# Patient Record
Sex: Male | Born: 1940 | Race: White | Hispanic: No | Smoking: Current every day smoker
Health system: Southern US, Community
[De-identification: ages and names within clinical notes are randomized; demographics above are authoritative.]

## PROBLEM LIST (undated history)

## (undated) DIAGNOSIS — F419 Anxiety disorder, unspecified: Secondary | ICD-10-CM

## (undated) DIAGNOSIS — E785 Hyperlipidemia, unspecified: Secondary | ICD-10-CM

## (undated) DIAGNOSIS — G43909 Migraine, unspecified, not intractable, without status migrainosus: Secondary | ICD-10-CM

## (undated) DIAGNOSIS — J9690 Respiratory failure, unspecified, unspecified whether with hypoxia or hypercapnia: Secondary | ICD-10-CM

## (undated) DIAGNOSIS — I219 Acute myocardial infarction, unspecified: Secondary | ICD-10-CM

## (undated) DIAGNOSIS — M5090 Cervical disc disorder, unspecified, unspecified cervical region: Secondary | ICD-10-CM

## (undated) DIAGNOSIS — K219 Gastro-esophageal reflux disease without esophagitis: Secondary | ICD-10-CM

## (undated) DIAGNOSIS — E119 Type 2 diabetes mellitus without complications: Secondary | ICD-10-CM

## (undated) DIAGNOSIS — Z8673 Personal history of transient ischemic attack (TIA), and cerebral infarction without residual deficits: Secondary | ICD-10-CM

## (undated) DIAGNOSIS — I2510 Atherosclerotic heart disease of native coronary artery without angina pectoris: Secondary | ICD-10-CM

## (undated) DIAGNOSIS — I10 Essential (primary) hypertension: Secondary | ICD-10-CM

## (undated) HISTORY — DX: Essential (primary) hypertension: I10

## (undated) HISTORY — DX: Acute myocardial infarction, unspecified: I21.9

## (undated) HISTORY — DX: Atherosclerotic heart disease of native coronary artery without angina pectoris: I25.10

## (undated) HISTORY — DX: Gastro-esophageal reflux disease without esophagitis: K21.9

## (undated) HISTORY — PX: BACK SURGERY: SHX140

## (undated) HISTORY — DX: Anxiety disorder, unspecified: F41.9

## (undated) HISTORY — DX: Hyperlipidemia, unspecified: E78.5

## (undated) HISTORY — PX: CAROTID ENDARTERECTOMY: SUR193

## (undated) HISTORY — DX: Personal history of transient ischemic attack (TIA), and cerebral infarction without residual deficits: Z86.73

## (undated) HISTORY — PX: ANTERIOR CERVICAL DECOMP/DISCECTOMY FUSION: SHX1161

## (undated) HISTORY — DX: Cervical disc disorder, unspecified, unspecified cervical region: M50.90

## (undated) HISTORY — DX: Migraine, unspecified, not intractable, without status migrainosus: G43.909

## (undated) HISTORY — DX: Type 2 diabetes mellitus without complications: E11.9

---

## 1995-04-28 DIAGNOSIS — Z8673 Personal history of transient ischemic attack (TIA), and cerebral infarction without residual deficits: Secondary | ICD-10-CM

## 1995-04-28 HISTORY — DX: Personal history of transient ischemic attack (TIA), and cerebral infarction without residual deficits: Z86.73

## 1998-09-09 ENCOUNTER — Inpatient Hospital Stay (HOSPITAL_COMMUNITY): Admission: RE | Admit: 1998-09-09 | Discharge: 1998-09-11 | Payer: Self-pay | Admitting: Neurosurgery

## 2001-04-27 HISTORY — PX: CORONARY ARTERY BYPASS GRAFT: SHX141

## 2001-11-30 ENCOUNTER — Encounter: Payer: Self-pay | Admitting: Thoracic Surgery (Cardiothoracic Vascular Surgery)

## 2001-11-30 ENCOUNTER — Inpatient Hospital Stay (HOSPITAL_COMMUNITY): Admission: EM | Admit: 2001-11-30 | Discharge: 2001-12-05 | Payer: Self-pay | Admitting: Cardiovascular Disease

## 2001-12-01 ENCOUNTER — Encounter: Payer: Self-pay | Admitting: Thoracic Surgery (Cardiothoracic Vascular Surgery)

## 2001-12-01 ENCOUNTER — Encounter: Payer: Self-pay | Admitting: Internal Medicine

## 2001-12-02 ENCOUNTER — Encounter: Payer: Self-pay | Admitting: Thoracic Surgery (Cardiothoracic Vascular Surgery)

## 2001-12-03 ENCOUNTER — Encounter: Payer: Self-pay | Admitting: Thoracic Surgery (Cardiothoracic Vascular Surgery)

## 2001-12-04 ENCOUNTER — Encounter: Payer: Self-pay | Admitting: Thoracic Surgery (Cardiothoracic Vascular Surgery)

## 2001-12-05 ENCOUNTER — Encounter: Payer: Self-pay | Admitting: Thoracic Surgery (Cardiothoracic Vascular Surgery)

## 2002-01-24 ENCOUNTER — Inpatient Hospital Stay (HOSPITAL_COMMUNITY)
Admission: EM | Admit: 2002-01-24 | Discharge: 2002-01-29 | Payer: Self-pay | Admitting: Thoracic Surgery (Cardiothoracic Vascular Surgery)

## 2002-01-25 ENCOUNTER — Encounter: Payer: Self-pay | Admitting: Thoracic Surgery (Cardiothoracic Vascular Surgery)

## 2004-06-09 ENCOUNTER — Ambulatory Visit: Payer: Self-pay | Admitting: Cardiology

## 2005-08-06 ENCOUNTER — Ambulatory Visit: Payer: Self-pay | Admitting: Cardiology

## 2005-08-06 ENCOUNTER — Inpatient Hospital Stay (HOSPITAL_COMMUNITY): Admission: AD | Admit: 2005-08-06 | Discharge: 2005-08-08 | Payer: Self-pay | Admitting: Internal Medicine

## 2005-08-26 ENCOUNTER — Ambulatory Visit: Payer: Self-pay | Admitting: Internal Medicine

## 2005-09-02 ENCOUNTER — Encounter (INDEPENDENT_AMBULATORY_CARE_PROVIDER_SITE_OTHER): Payer: Self-pay | Admitting: *Deleted

## 2005-09-02 ENCOUNTER — Ambulatory Visit (HOSPITAL_COMMUNITY): Admission: RE | Admit: 2005-09-02 | Discharge: 2005-09-02 | Payer: Self-pay | Admitting: Internal Medicine

## 2005-09-02 ENCOUNTER — Ambulatory Visit: Payer: Self-pay | Admitting: Internal Medicine

## 2005-09-14 ENCOUNTER — Ambulatory Visit (HOSPITAL_COMMUNITY): Admission: RE | Admit: 2005-09-14 | Discharge: 2005-09-14 | Payer: Self-pay | Admitting: Internal Medicine

## 2006-04-07 ENCOUNTER — Ambulatory Visit: Payer: Self-pay | Admitting: Cardiology

## 2006-04-07 ENCOUNTER — Inpatient Hospital Stay (HOSPITAL_COMMUNITY): Admission: AD | Admit: 2006-04-07 | Discharge: 2006-04-08 | Payer: Self-pay | Admitting: Cardiology

## 2006-04-08 ENCOUNTER — Encounter: Payer: Self-pay | Admitting: Cardiology

## 2006-04-29 ENCOUNTER — Ambulatory Visit: Payer: Self-pay | Admitting: Cardiology

## 2008-08-31 ENCOUNTER — Encounter (INDEPENDENT_AMBULATORY_CARE_PROVIDER_SITE_OTHER): Payer: Self-pay

## 2009-02-06 ENCOUNTER — Ambulatory Visit: Payer: Self-pay | Admitting: Cardiology

## 2009-02-06 ENCOUNTER — Encounter: Payer: Self-pay | Admitting: Physician Assistant

## 2009-02-06 ENCOUNTER — Encounter: Payer: Self-pay | Admitting: Cardiology

## 2009-02-07 ENCOUNTER — Encounter: Payer: Self-pay | Admitting: Cardiology

## 2009-02-08 ENCOUNTER — Encounter: Payer: Self-pay | Admitting: Cardiology

## 2009-02-27 ENCOUNTER — Encounter: Payer: Self-pay | Admitting: Cardiology

## 2009-02-27 DIAGNOSIS — I2510 Atherosclerotic heart disease of native coronary artery without angina pectoris: Secondary | ICD-10-CM

## 2009-02-27 DIAGNOSIS — I10 Essential (primary) hypertension: Secondary | ICD-10-CM | POA: Insufficient documentation

## 2009-02-27 DIAGNOSIS — R079 Chest pain, unspecified: Secondary | ICD-10-CM

## 2009-02-27 DIAGNOSIS — E7800 Pure hypercholesterolemia, unspecified: Secondary | ICD-10-CM

## 2009-02-27 DIAGNOSIS — Z951 Presence of aortocoronary bypass graft: Secondary | ICD-10-CM | POA: Insufficient documentation

## 2009-02-27 DIAGNOSIS — G43909 Migraine, unspecified, not intractable, without status migrainosus: Secondary | ICD-10-CM | POA: Insufficient documentation

## 2009-02-27 DIAGNOSIS — R55 Syncope and collapse: Secondary | ICD-10-CM

## 2009-02-27 DIAGNOSIS — Z8673 Personal history of transient ischemic attack (TIA), and cerebral infarction without residual deficits: Secondary | ICD-10-CM | POA: Insufficient documentation

## 2009-02-27 DIAGNOSIS — I4901 Ventricular fibrillation: Secondary | ICD-10-CM | POA: Insufficient documentation

## 2009-02-27 DIAGNOSIS — E119 Type 2 diabetes mellitus without complications: Secondary | ICD-10-CM | POA: Insufficient documentation

## 2009-06-03 ENCOUNTER — Ambulatory Visit: Payer: Self-pay | Admitting: Cardiology

## 2009-06-03 DIAGNOSIS — I714 Abdominal aortic aneurysm, without rupture, unspecified: Secondary | ICD-10-CM | POA: Insufficient documentation

## 2009-06-03 DIAGNOSIS — F17200 Nicotine dependence, unspecified, uncomplicated: Secondary | ICD-10-CM

## 2009-06-06 ENCOUNTER — Encounter: Payer: Self-pay | Admitting: Cardiology

## 2009-06-13 ENCOUNTER — Encounter (INDEPENDENT_AMBULATORY_CARE_PROVIDER_SITE_OTHER): Payer: Self-pay | Admitting: *Deleted

## 2010-01-21 ENCOUNTER — Ambulatory Visit: Payer: Self-pay | Admitting: Cardiology

## 2010-05-27 NOTE — Medication Information (Signed)
Summary: RX Folder/ MEDICATION RECORD  RX Folder/ MEDICATION RECORD   Imported By: Dorise Hiss 06/04/2009 08:38:13  _____________________________________________________________________  External Attachment:    Type:   Image     Comment:   External Document

## 2010-05-27 NOTE — Assessment & Plan Note (Signed)
Summary: 3 month fu-missed last appt due to not getting his mail in ti...   Visit Type:  Follow-up Primary Provider:  Dr.Shah   History of Present Illness: the patient is a 70 year old male with coronary artery disease, status post coronary bypass grafting.patient was hospitalized with chest pain in October of 2010. He was ruled out for myocardial infarction. Ejection fraction was normalized at 55%. The patient reports frequent migraine headaches which are accompanied by lightheadedness and dizziness and occasional syncope. This has been a rod or chronic problem. He has been evaluated with a CardioNet monitorr and was found not to be significant arrhythmias. The patient presents for followup.  From a cardiac perspective he appears to be doing well. He does report shows a breath however in exertion. He is unable to walk very far because of bilateral hip pain. He denies however any substernal chest pain and has not required any sublingual nitroglycerin. The patient does report a couple of syncopal episodes last week each time started by headaches and nausea and getting up rather quickly. He states that his only out very briefly. 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  Unfortunate patient continues to smoke. I counseled him regarding tobacco use and discontinuation. He has also never been screened for a AAA and he does have a strong family history for abdominal aortic aneurysm. His brother died from a ruptured aneurysm and also has a sister with aneurysmal disease.  Clinical Review Panels:  CXR CXR results stable cardiomegaly no active disease (02/06/2009)  Echocardiogram Echocardiogram LV chamber size is normal. 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 Overall wall motion is normal. Ejection fraction 55% no significant valvular heart disease.  (02/07/2009)  Cardiac Imaging Cardiac Cath Findings   CARDIAC CATHETERIZATION  CONCLUSION:   1. Coronary artery disease, status post coronary bypass graft surgery       in 2003, and status post prior anterior wall infarction and       stenting of the left anterior descending artery (LAD) prior to the       bypass surgery.   2. Severe native vessel disease with 30% narrowing in the left main       coronary artery, 30% proximal stenosis in the left anterior       descending artery, 0% stenosis at the stent site in the mid LAD,       40% narrowing in the second diagonal branch and 50-70% narrowing in       the third diagonal branch of the LAD, 70% narrowing in the distal       circumflex artery and total occlusion of the mid-right coronary       artery.   3. Patent vein graft to the distal right coronary artery, patent vein       graft to the posterolateral branch of the circumflex artery, and an       atretic LIMA graft to the LAD.   4. Inferobasal wall hypokinesis with an estimated ejection fraction of       55%.     RECOMMENDATIONS:  There does not appear to be any source of ischemia.   Although the LIMA graft is atretic, the circulation down the LAD through   the native circulation is quite good.  Will plan further evaluation with   a D-dimer and arrange follow-up with Dr. Andee Lineman.  Bruce Elvera Lennox Juanda Chance, MD, [facility]    (04/08/2006)    Preventive Screening-Counseling & Management  Alcohol-Tobacco     Smoking Status: current     Smoking Cessation Counseling: yes     Packs/Day: <1 ppd  Current Medications (verified): 1)  Aspir-Low 81 Mg Tbec (Aspirin) .... Take 1 Tab Daily 2)  Metformin Hcl 500 Mg Tabs (Metformin Hcl) .... Take 2 Tablet By Mouth Two Times A Day 3)  Glimepiride 2 Mg Tabs (Glimepiride) .... Take 1 Tablet By Mouth Two Times A Day 4)  Nexium 40 Mg Cpdr (Esomeprazole Magnesium) .... Take 1 Tablet By Mouth Once A Day 5)  Metoprolol Tartrate 100 Mg Tabs  (Metoprolol Tartrate) .... Take 1 Tablet By Mouth in Am and 1/2 in Pm 6)  Plavix 75 Mg Tabs (Clopidogrel Bisulfate) .... Take 1 Tablet By Mouth Once A Day 7)  Flexeril 10 Mg Tabs (Cyclobenzaprine Hcl) .... Take 1 Tablet By Mouth Three Times A Day As Needed 8)  Norvasc 10 Mg Tabs (Amlodipine Besylate) .... Take 1 Tablet By Mouth Once A Day 9)  Lipitor 20 Mg Tabs (Atorvastatin Calcium) .... Take 1 Tab Daily 10)  Nitrostat 0.4 Mg Subl (Nitroglycerin) .... Take As Directed For Chest Pain 11)  Alprazolam 1 Mg Tabs (Alprazolam) .... Take 1 Tab Two Times A Day 12)  Vitamin C Cr 500 Mg Cr-Tabs (Ascorbic Acid) .... Take 1 Tab Daily 13)  Hydrochlorothiazide 25 Mg Tabs (Hydrochlorothiazide) .... 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 Coronary artery disease     a.     Status post relook catheterization with patent grafts.     b.     Status post coronary bypass grafting. 2. Hypertension. 3. Type 2 diabetes mellitus. 4. Status post CVA. 5. Status post left carotid endarterectomy.   Past Surgical History: Last updated: 03/01/09 Multiple lower back surgeries anterior cervical discectomy fusion Left carotid endarterectomy CABG Cardiac Catheterization  Family History: Last updated: March 01, 2009 Father:died with MI Mother:died with MI Brother died age 30 to MI  Social History: Last updated: Mar 01, 2009 Patient lives alone smokes apprx. 1 pack a month Alcohol Use - no  Risk  Factors: Smoking Status: current (06/03/2009) Packs/Day: <1 ppd (06/03/2009)  Social History: Smoking Status:  current Packs/Day:  <1 ppd  Review of Systems       The patient complains of shortness of breath, joint pain, headache, and fainting.  The patient denies fatigue, malaise, fever, weight gain/loss, vision loss, decreased hearing, hoarseness, chest pain, palpitations, prolonged cough, wheezing, sleep apnea, coughing up blood, abdominal pain, blood in stool, nausea, vomiting, diarrhea, heartburn, incontinence, blood in urine, muscle weakness, leg swelling, rash, skin lesions, dizziness, depression, anxiety, enlarged lymph nodes, easy bruising or bleeding, and environmental allergies.    Vital Signs:  Patient profile:   70 year old male Height:      65 inches Weight:      198 pounds BMI:     33.07 Pulse rate:   77 / minute Pulse (ortho):   77 / minute BP sitting:   130 / 90  (  right arm) BP standing:   97 / 65  Vitals Entered By: Dreama Saa, CNA (June 03, 2009 2:28 PM)  Serial Vital Signs/Assessments:  Time      Position  BP       Pulse  Resp  Temp     By 3:17 PM   Lying LA  110/75   77                    Carlye Grippe 3:17 PM   Sitting   101/71   77                    Carlye Grippe 3:17 PM   Standing  97/65    77                    Carlye Grippe 3:21 PM   Standing  109/74   81                    Carlye Grippe 3:26 PM   Standing  117/82   85                    Carlye Grippe   Physical Exam  Additional Exam:  General: Well-developed, well-nourished in no distress head: Normocephalic and atraumatic eyes PERRLA/EOMI intact, conjunctiva and lids normal nose: No deformity or lesions mouth normal dentition, normal posterior pharynx neck: Supple, no JVD.  No masses, thyromegaly or abnormal cervical nodes. No definite carotid bruits, well-healed left carotid endarterectomy scar. lungs:diminished breath sounds bilaterally without wheezing.  Normal percussion heart:  regular rate and rhythm with normal S1 and S2, no S3 or S4.  PMI is normal.  No pathological murmurs abdomen: Normal bowel sounds, abdomen is soft and nontender without masses, organomegaly or hernias noted.  No hepatosplenomegaly musculoskeletal: Back normal, normal gait muscle strength and tone normal pulsus: Pulse is normal in all 4 extremities Extremities: No peripheral pitting edema neurologic: Alert and oriented x 3 skin: Intact without lesions or rashes cervical nodes: No significant adenopathy psychologic: Normal affect    EKG  Procedure date:  06/03/2009  Findings:      normal sinus rhythm nonspecific ST-T wave changes. Heart rate 76 beats per minute.  Impression & Recommendations:  Problem # 1:  AAA (ICD-441.4) the patient is at high risk for AAA. He has a family history of AAA in his siblings. Is a long-time smoker with vascular disease. An ultrasound has been ordered.  Problem # 2:  POSTSURGICAL AORTOCORONARY BYPASS STATUS (ICD-V45.81) the patient status post coronary bypass grafting. He reports no recurrent subdural chest pain. He was ruled out for myocardial infarction by his recent hospitalization.  Problem # 3:  HYPERTENSION (ICD-401.9) blood pressure is controlled. Continue current medical regimen. His updated medication list for this problem includes:    Aspir-low 81 Mg Tbec (Aspirin) .Marland Kitchen... Take 1 tab daily    Metoprolol Tartrate 100 Mg Tabs (Metoprolol tartrate) .Marland Kitchen... Take 1 tablet by mouth in am and 1/2 in pm    Norvasc 10 Mg Tabs (Amlodipine besylate) .Marland Kitchen... Take 1 tablet by mouth once a day    Hydrochlorothiazide 25 Mg Tabs (Hydrochlorothiazide) .Marland Kitchen... Take 1 tab daily  Problem # 4:  SYNCOPE (ICD-780.2) patient is a vasovagal syncope related to the onset of migraine headaches. Has an extensive previous workup.no definite orthostasis by blood pressure or heart rate. His updated medication list for this problem includes:    Aspir-low 81 Mg  Tbec (Aspirin) .Marland Kitchen...  Take 1 tab daily    Metoprolol Tartrate 100 Mg Tabs (Metoprolol tartrate) .Marland Kitchen... Take 1 tablet by mouth in am and 1/2 in pm    Plavix 75 Mg Tabs (Clopidogrel bisulfate) .Marland Kitchen... Take 1 tablet by mouth once a day    Norvasc 10 Mg Tabs (Amlodipine besylate) .Marland Kitchen... Take 1 tablet by mouth once a day    Nitrostat 0.4 Mg Subl (Nitroglycerin) .Marland Kitchen... Take as directed for chest pain  Orders: EKG w/ Interpretation (93000) Abdominal Aorta Duplex (Abd Aorta Dup)  Problem # 5:  TOBACCO USER (ICD-305.1) the patient has been counseled to discontinue tobacco use.  Patient Instructions: 1)  Your physician has requested that you have an abdominal aorta duplex. During this test, an ultrasound is used to evaluate the aorta. Allow 30 minutes for this exam. Do not eat after midnight the day before and avoid carbonated beverages. There are no restrictions or special instructions. 2)  Your physician recommends that you continue on your current medications as directed. Please refer to the Current Medication list given to you today. 3)  Your physician wants you to follow-up in: . You will receive a reminder letter in the about mail two months in advance. If you don't receive a letter, please call our office to schedule the follow-up appointment.

## 2010-05-27 NOTE — Letter (Signed)
Summary: Engineer, materials at [facility]  [address]   [place], [place] [address]   Phone: [phone]  Fax: [phone]        June 13, 2009 MRN: [id number]   [facility] [address] [place], [place]  [address]   Dear Mr. LESSLEY,  Your test ordered by Selena Batten has been reviewed by your physician (or physician assistant) and was found to be normal or stable. Your physician (or physician assistant) felt no changes were needed at this time.  ____ Echocardiogram  ____ Cardiac Stress Test  ____ Lab Work  ____ Peripheral vascular study of arms, legs or neck  ____ CT scan or X-ray  ____ Lung or Breathing test  __X__ Other:  no abdominal aortic aneurysm   Thank you.   Hoover Brunette, LPN    Duane Boston, M.D., F.A.C.C. Thressa Sheller, M.D., F.A.C.C. Oneal Grout, M.D., F.A.C.C. Cheree Ditto, M.D., F.A.C.C. Daiva Nakayama, M.D., F.A.C.C. Kenney Houseman, M.D., F.A.C.C. Jeanne Ivan, PA-C

## 2010-05-27 NOTE — Assessment & Plan Note (Signed)
Summary: 6 MO FU PER AUG REMINDER-SRS   Visit Type:  Follow-up Primary Provider:  Dr.Shah   History of Present Illness: the patient is a 70 year old male with history of coronary artery disease, status post coronary bypass grafting, peripheral vascular disease status post left carotid endarterectomy. The patient earlier this year was screened for AAA with a negative abdominal ultrasound. 3 office visits previously he had complaints of presyncope associated with headaches and orthostasis. He has had no recurrent symptoms. He started feeling quite well. He denies any chest pain shortness of breath orthopnea PND. The patient is followed by his primary care physician in regards to his lipid management.  Preventive Screening-Counseling & Management  Alcohol-Tobacco     Smoking Status: current     Smoking Cessation Counseling: yes     Packs/Day: <1/4 PPD  Current Medications (verified): 1)  Aspir-Low 81 Mg Tbec (Aspirin) .... Take 1 Tab Daily 2)  Metformin Hcl 500 Mg Tabs (Metformin Hcl) .... Take 2 Tablet By Mouth Two Times A Day 3)  Glimepiride 2 Mg Tabs (Glimepiride) .... Take 1 Tablet By Mouth Two Times A Day 4)  Nexium 40 Mg Cpdr (Esomeprazole Magnesium) .... Take 1 Tablet By Mouth Once A Day 5)  Metoprolol Tartrate 100 Mg Tabs (Metoprolol Tartrate) .... Take 1 Tablet By Mouth in Am and 1/2 in Pm 6)  Plavix 75 Mg Tabs (Clopidogrel Bisulfate) .... Take 1 Tablet By Mouth Once A Day 7)  Flexeril 10 Mg Tabs (Cyclobenzaprine Hcl) .... Take 1 Tablet By Mouth Three Times A Day As Needed 8)  Norvasc 10 Mg Tabs (Amlodipine Besylate) .... Take 1 Tablet By Mouth Once A Day 9)  Lipitor 20 Mg Tabs (Atorvastatin Calcium) .... Take 1 Tab Daily 10)  Nitrostat 0.4 Mg Subl (Nitroglycerin) .... Take As Directed For Chest Pain 11)  Alprazolam 1 Mg Tabs (Alprazolam) .... Take 1 Tab Two Times A Day 12)  Vitamin C Cr 500 Mg Cr-Tabs (Ascorbic Acid) .... Take 1 Tab Daily 13)  Hydrochlorothiazide 25 Mg Tabs  (Hydrochlorothiazide) .... Take 1 Tab Daily  Allergies (verified): 1)  ! Ibuprofen  Comments:  Nurse/Medical Assistant: The patient's medication list and allergies were reviewed with the patient and were updated in the Medication and Allergy Lists.  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 Coronary artery disease     a.     Status post relook catheterization with patent grafts.     b.     Status post coronary bypass grafting. 2. Hypertension. 3. Type 2 diabetes mellitus. 4. Status post CVA. 5. Status post left carotid endarterectomy.   Past Surgical History: Last updated: 03-20-2009 Multiple lower back surgeries anterior cervical discectomy fusion Left carotid endarterectomy CABG Cardiac Catheterization  Family History: Last updated: 03-20-09 Father:died with MI Mother:died with MI Brother died age 58 to MI  Social History: Last updated: 03/20/2009 Patient lives alone smokes apprx. 1 pack a month Alcohol Use - no  Risk Factors: Smoking Status: current (01/21/2010) Packs/Day: <1/4 PPD (01/21/2010)  Social History: Packs/Day:  <1/4 PPD  Review of Systems  The patient denies fatigue, malaise, fever, weight gain/loss, vision loss, decreased hearing,  hoarseness, chest pain, palpitations, shortness of breath, prolonged cough, wheezing, sleep apnea, coughing up blood, abdominal pain, blood in stool, nausea, vomiting, diarrhea, heartburn, incontinence, blood in urine, muscle weakness, joint pain, leg swelling, rash, skin  lesions, headache, fainting, dizziness, depression, anxiety, enlarged lymph nodes, easy bruising or bleeding, and environmental allergies.    Vital Signs:  Patient profile:   70 year old male Height:      65 inches Weight:      201 pounds Pulse rate:   74 / minute BP sitting:   108 / 74  (left arm) Cuff size:   large  Vitals Entered By: Carlye Grippe (January 21, 2010 8:47 AM)  Physical Exam  Additional Exam:  General: Well-developed, well-nourished in no distress head: Normocephalic and atraumatic eyes PERRLA/EOMI intact, conjunctiva and lids normal nose: No deformity or lesions mouth normal dentition, normal posterior pharynx neck: Supple, no JVD.  No masses, thyromegaly or abnormal cervical nodes. No definite carotid bruits, well-healed left carotid endarterectomy scar. lungs:diminished breath sounds bilaterally without wheezing.  Normal percussion heart: regular rate and rhythm with normal S1 and S2, no S3 or S4.  PMI is normal.  No pathological murmurs abdomen: Normal bowel sounds, abdomen is soft and nontender without masses, organomegaly or hernias noted.  No hepatosplenomegaly musculoskeletal: Back normal, normal gait muscle strength and tone normal pulsus: Pulse is normal in all 4 extremities Extremities:1+ pitting edema neurologic: Alert and oriented x 3 skin: Intact without lesions or rashes cervical nodes: No significant adenopathy psychologic: Normal affect    Impression & Recommendations:  Problem # 1:  TOBACCO USER (ICD-305.1) I counseled the patient regarding his tobacco use. He still smokes a quarter pack a day.  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 Aorta measured at 3 cm.  Problem # 3:  MIGRAINE UNSP W/O INTRACT W/O STATUS MIGRAINOSUS (ICD-346.90) resolved His updated medication list for this problem includes:    Aspir-low 81 Mg Tbec (Aspirin) .Marland Kitchen... Take 1 tab daily    Metoprolol Tartrate 100 Mg  Tabs (Metoprolol tartrate) .Marland Kitchen... Take 1 tablet by mouth in am and 1/2 in pm  Problem # 4:  POSTSURGICAL AORTOCORONARY BYPASS STATUS (ICD-V45.81) no recurrent chest pain. Negative Cardiolite study one year ago normal ejection fraction at 55%.  Patient Instructions: 1)  Your physician recommends that you continue on your current medications as directed. Please refer to the Current Medication list given to you today. 2)  Follow up in  1 year

## 2010-09-12 NOTE — Cardiovascular Report (Signed)
Miguel Mendoza, Miguel Mendoza               ACCOUNT NO.:  [id number]   MEDICAL RECORD NO.:  [id number]          PATIENT TYPE:  INP   LOCATION:  3703                         FACILITY:  [facility]   PHYSICIAN:  Everardo Beals. Juanda Chance, MD, FACCDATE OF BIRTH:  [birth year]   DATE OF PROCEDURE:  04/08/2006  DATE OF DISCHARGE:  04/08/2006                            CARDIAC CATHETERIZATION   CLINICAL HISTORY:  Mr. Elsen is 70 years old and had a previous  anterior wall MI and angioplasty and stenting performed at [facility] at that time.  He subsequently had bypass surgery at [facility] in 2003.  He was admitted to [facility] with chest pain  and was transferred to [facility] for further evaluation with  angiography.   PROCEDURE:  The procedure was performed with the right femoral artery  using an arterial sheath and 6-French preformed coronary catheters.  A  front wall arterial puncture was performed and Omnipaque contrast was  used.  The patient tolerated the procedure well and left the laboratory  in satisfactory condition.   RESULTS:  LEFT MAIN CORONARY ARTERY:  The left main coronary artery had  a 30% distal stenosis.   LEFT ANTERIOR DESCENDING ARTERY:  The left anterior descending artery  gave rise to three diagonal branches and three septal perforators.  There was 30% narrowing of the proximal LAD.  There was 0% stenosis at  the stent site in the mid LAD.  There was 40% narrowing in the second  diagonal branch and 50-70% narrowing in the third diagonal branch.  There was no competing flow from the mammary artery.   CIRCUMFLEX ARTERY:  The circumflex artery gave rise to a ramus branch,  an atrial branch and a large posterolateral branch.  And there was 70%  narrowing in the distal circumflex artery before the posterolateral  branch.  There was competing flow distally.   RIGHT CORONARY ARTERY:  The right coronary artery was completely  occluded after a right ventricular branch  at its midportion.   The saphenous vein graft to the distal right coronary artery was patent  and functioned normally and filled the posterior descending and two  posterolateral branches.   The saphenous vein graft to the posterolateral branch of the circumflex  artery was patent and functioned normally.   The LIMA graft to the LAD was atretic and did not fill the LAD.   The left ventriculogram performed in the RAO projection showed  hypokinesis of the inferobasal wall.  The estimated ejection fraction  was 55%.   A distal aortogram was performed which showed no renal artery stenosis  and no major aortoiliac obstruction.   CONCLUSION:  1. Coronary artery disease, status post coronary bypass graft surgery      in 2003, and status post prior anterior wall infarction and      stenting of the left anterior descending artery (LAD) prior to the      bypass surgery.  2. Severe native vessel disease with 30% narrowing in the left main      coronary artery, 30% proximal stenosis in the left anterior  descending artery, 0% stenosis at the stent site in the mid LAD,      40% narrowing in the second diagonal branch and 50-70% narrowing in      the third diagonal branch of the LAD, 70% narrowing in the distal      circumflex artery and total occlusion of the mid-right coronary      artery.  3. Patent vein graft to the distal right coronary artery, patent vein      graft to the posterolateral branch of the circumflex artery, and an      atretic LIMA graft to the LAD.  4. Inferobasal wall hypokinesis with an estimated ejection fraction of      55%.   RECOMMENDATIONS:  There does not appear to be any source of ischemia.  Although the LIMA graft is atretic, the circulation down the LAD through  the native circulation is quite good.  Will plan further evaluation with  a D-dimer and arrange follow-up with Dr. Andee Lineman.      Miguel Mendoza Juanda Chance, MD, [facility]  Electronically Signed     BRB/MEDQ   D:  09/20/2006  T:  09/21/2006  Job:  [id number]   cc:   [name], MD  Learta Codding, MD,FACC

## 2010-09-12 NOTE — Consult Note (Signed)
NAME:  Miguel Mendoza, Miguel Mendoza                         ACCOUNT NO.:  [id number]   MEDICAL RECORD NO.:  [id number]                   PATIENT TYPE:  INP   LOCATION:  2018                                 FACILITY:  [facility]   PHYSICIAN:  Salvatore Decent. Cornelius Moras, M.D.              DATE OF BIRTH:  [birth year]   DATE OF CONSULTATION:  11/30/2001  DATE OF DISCHARGE:                       CARDIOVASCULAR/THORACIC CONSULTATION   REQUESTING PHYSICIAN:  Salvadore Farber, MD   PRIMARY CARDIOLOGIST:  Luis Abed, M.D.   PRIMARY CARE PHYSICIAN:  [name].   REASON FOR CONSULTATION:  Left main disease with class IV unstable angina.   HISTORY OF PRESENT ILLNESS:  The patient is a 70 -year-old obese white male  from [place], [place], with history of coronary artery disease dating  back to 1998 at which time he apparently suffered an acute anterior  myocardial infarction and underwent angioplasty and stent placement of the  left anterior descending coronary artery at [facility] in  [place], [place].  He has done well since then.  He apparently  had a stress Cardiolite exam in 2002 which revealed a baseline ejection  fraction of 45% with no sign of coronary ischemia by report.  The patient  states he was in his usual state of health until two days ago when he  developed new onset of severe squeezing substernal chest pain which radiated  through to his back. The pain persisted for quite some time but was  ultimately relieved following administration of sublingual nitroglycerin.  It recurred yesterday and was not relieved, prompting him to present to the  emergency room at [facility] in [place], [place].  Ultimately  his pain was relieved there with medical therapy.  Cardiac enzymes were  negative for signs of acute myocardial infarction.  The patient was  transferred to [facility] for further management.  He  underwent cardiac catheterization  today by Dr. Samule Ohm.  This demonstrates 60  to 70% ulcerated stenosis of the distal left main coronary artery.  There  was diffuse coronary artery disease with diffuse narrowing of all of the  epicardial coronary vessels.  There is multiple 50 to 60% lesions in the  proximal and mid right coronary artery.  Following injection of the right  coronary artery the patient suffered a ventricular fibrillation arrest from  which he was promptly resuscitated in the catheterization lab.  The patient  has remained medically stable since that time.  No left ventriculogram was  performed.  Cardiac surgical consultation is requested.   REVIEW OF SYMPTOMS:  General:  The patient reports that he has otherwise  been well.  He has actually lost more than 40 pounds of weight over the last  six months due to a rigorous diet program.  He states he otherwise feels  well.  Respiratory:  Notable for chronic intermittent nonproductive cough.  He denies any productive cough  or hemoptysis.  Gastrointestinal: Negative.  The patient specifically denies any problems with dysphagia, odynophagia,  hematochezia, hematemesis, or melena.  He has not had problems with  constipation or diarrhea.  Neurologic:  Negative recently.  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  The patient states that all of his symptoms resolved with definite and very  slight droop along the corner of his right mouth.  He denies any recent  episodes of transient and ocular blindness or recent numbness or weakness  involving  upper or lower extremities.  Musculoskeletal:  Notable for  chronic problems with his lower back and neck.  The patient is stable.  The  patient has had numerous surgical procedures.  He has chronic pain that  radiated to his back.  He denies any other arthritis.  Genitourinary:  Negative.  Infectious:  Negative.  Hematologic:  Negative.  Endocrine:  Notable for type 2 diabetes  mellitus.  Psychiatric:  Notable for chronic  nerve problems for which the patient takes Xanax.  The patient has history  of an overdose of Darvocet pain medication in 2001.  HEENT:  Negative.  The  patient has full set upper and lower dentures.  Peripheral vascular:  Negative.  The patient denies symptoms of claudication.   PAST MEDICAL HISTORY:  This is notable for coronary artery disease,  hypertension, type 2 diabetes mellitus, hyperlipidemia, longstanding tobacco  abuse, and previous stroke.   PAST SURGICAL HISTORY:  This is notable for multiple lower back surgeries as  well as anterior cervical discectomy and fusion.  The patient has had  bilateral inguinal hernia repairs and hemorrhoidectomy in the distant past.   FAMILY HISTORY:  This is notable for strong presence of coronary artery  disease.  His father died of myocardial infarction at age 98.  He has one  sister who has had previous bypass surgery and a stroke.   SOCIAL HISTORY:  The patient is disabled and lives with his wife.  They have  two adopted children from [place] who are teenagers.  He has a longstanding  history of tobacco abuse, although he states he has cut back from smoking  three packs of cigarettes per day to now less than one half pack per day.  The patient reports no recent history of significant alcohol consumption.   MEDICATIONS PRIOR TO ADMISSION:  1. Glucotrol XL 10 mg every morning.  2. Norvasc 5 mg every morning.  3. TNT study drug for hypercholesterolemia once daily.  4. Xanax twice daily for nerves.  5. Lortab twice daily as needed for pain.  6. Prozac 90 mg daily.   ALLERGIES:  IBUPROFEN causes hives and CODEINE causes nausea.   PHYSICAL EXAMINATION:  GENERAL APPEARANCE:  A well-appearing, moderately  obese white male who appears his stated age in no acute distress. VITAL SIGNS:  He is currently normotensive and in normal sinus rhythm with  occasional PVC by monitor.  HEENT:  Notable for  dentures.  NECK:  Supple.  There is a well-healed scar on the left anterior neck.  No  carotid bruits are noted.  CHEST:  Auscultation of the chest demonstrates clear and symmetric breath  sounds bilaterally.  No wheezes or rhonchi are demonstrated.  CARDIOVASCULAR:  Notable for regular rate and rhythm.  No murmurs, rubs, or  gallops are appreciated.  ABDOMEN:  Moderately obese, soft, nontender. There are no palpable masses.  The liver edge is not large.  EXTREMITIES:  Warm  and well-perfused.  There was no lower extremity edema.  Distal pulses are palpable in both lower legs at the ankle. There is no  venous insufficiency.  NEUROLOGIC:  Grossly nonfocal and symmetrical with exception of a mild right  facial droop.  RECTAL AND GU:  Examinations are both deferred.  The remainder of the physical examination is unrevealing.   DIAGNOSTIC TESTS:  Cardiac catheterization films performed today by Dr.  Samule Ohm reviewed and notable for the ulcerated 60 to 70% distal left main  coronary stenosis as previously outlined.  There is diffuse distal coronary  artery disease.  There are multiple 50% lesions in the proximal and mid  right coronary artery.  Left ventricular function remains unknown.  I  believe that the patient is best treated with coronary artery bypass  grafting.   PLAN:  Tentatively plan to proceed with surgery tomorrow.  We will perform  intraoperative transesophageal echocardiogram to assess his left ventricular  function.  The patient and his family understand and accept all associated  risks of surgery including but not limited to risk of death, stroke,  myocardial infarction, respiratory failure, congestive heart failure,  bleeding requiring blood transfusion, arrhythmia, and recurrent coronary  artery disease.  All questions have been addressed.  Alternative treatments  have been discussed.                                                Salvatore Decent. Cornelius Moras, M.D.    CHO/MEDQ   D:  11/30/2001  T:  12/05/2001  Job:  [id number]   cc:   Salvadore Farber, MD [facility]  [address] [place], [place] [address]  Fax: 1   Luis Abed, M.D. [facility]   Jonelle Sidle, M.D. [facility]   [facility]  [place], [place]

## 2010-09-12 NOTE — Op Note (Signed)
NAME:  Miguel Mendoza, Miguel Mendoza                         ACCOUNT NO.:  [id number]   MEDICAL RECORD NO.:  [id number]                   PATIENT TYPE:  INP   LOCATION:  2018                                 FACILITY:  [facility]   PHYSICIAN:  Burna Forts, M.D.             DATE OF BIRTH:  [birth year]   DATE OF PROCEDURE:  DATE OF DISCHARGE:                                 OPERATIVE REPORT   PROCEDURE:  Anesthesia intraoperative transesophageal echocardiograph.   INDICATIONS FOR PROCEDURE:  [name] is a 70 year old gentleman who is a  patient of Dr. Tressie Stalker who has presented today for coronary artery  bypass grafting by Dr. Cornelius Moras. It has been requested that we place a TE probe  for evaluation of cardiac structures and function both pre and post  cardiopulmonary bypass grafting.   DESCRIPTION OF PROCEDURE:  The patient is brought to the holding area the  morning of surgery where under local anesthesia, radial, arterial and  pulmonary artery lines were placed under local anesthesia with sedation. The  patient was then taken to the operating room for routine induction of  general anesthesia. The trachea was intubated, the TE probe was then  lubricated and passed oropharyngeally into the stomach and then withdrawn  for imaging of the cardiac structures.   Precardial bypass TE examination:   LEFT VENTRICLE:  This was a mildly thickened left ventricular chamber and  short __________, normal chamber sizes noted. Overall, there is good left  ventricular function noted in all segmental areas. In the short axis view  appeared to thicken and move inward during systolic contraction. There did  not appear to be any other masses noted or any asymmetry of ventricular  contraction overall. Left ventricular function is noted.   MITRAL VALVE:  A mildly thickened mitral valve apparatus overall. However,  it does appear to be functioning in an entirely appropriate manner. It  coapts appropriately just  below the level of the annulus. Both posterior and  anterior leaflets are seen on several views. They appear to be essentially  normal. There is perhaps trace mitral regurgitant flow noted.   LEFT ATRIUM:  Normal.   RIGHT VENTRICLE:  Normal.   AORTIC:  Three leaflets are noted. These are dense cusps that open well.  There is some mild aortic sclerosis noted but overall the opening dimensions  are appropriate and essentially normal. There is neither stenotic flow noted  nor is there is any aortic insufficiency noted.   The patient was placed on cardiopulmonary bypass, coronary artery bypass  grafting is carried out. The patient is then rewarmed and separated from  cardiopulmonary bypass on the initial attempt.   Post cardiopulmonary bypass TE examination:  (limited exam).   LEFT VENTRICLE:  In the early post-bypass period, this is an entirely normal-  appearing left ventricular chamber with no changes compared to the  prebypass. It remains a good left ventricular chamber  for its function and  appearance.   The rest of the cardiac examination is as previously described. There are no  noted changes either mitral or aortic or ventricular or outflow tracts  noted. The patient was returned to the Cardiac Intensive Care Unit in stable  condition.                                               Burna Forts, M.D.    JTM/MEDQ  D:  12/01/2001  T:  12/05/2001  Job:  [id number]

## 2010-09-12 NOTE — Op Note (Signed)
NAME:  Miguel Mendoza, Miguel Mendoza               ACCOUNT NO.:  [id number]   MEDICAL RECORD NO.:  [id number]          PATIENT TYPE:  AMB   LOCATION:  DAY                           FACILITY:  [facility]   PHYSICIAN:  R. Roetta Sessions, M.D. DATE OF BIRTH:  July 03, 1940   DATE OF PROCEDURE:  09/02/2005  DATE OF DISCHARGE:                                 OPERATIVE REPORT   PROCEDURE:  1.  Esophagogastroduodenoscopy with Maloney dilatation.  2.  Colonoscopy.  3.  Snare polypectomy.   ENDOSCOPIST:  Jonathon Bellows, M.D.   INDICATION FOR PROCEDURE:  The patient is a 70 year old Caucasian male with  a 41-month history of progressive dysphagia to solids and liquids.  He also  had abdominal pain back in March attributed to diverticulitis.  He has never  had his lower GI tract evaluated.  His abdominal pain has resolved.  EGD and  colonoscopy are now being done.  The potential for esophageal dilatation  along with the technical aspects of the procedures otherwise, along with the  potential risks, benefits and alternatives have been reviewed at length with  Miguel Mendoza.  His questions were answered and he is agreeable.  Please see  documentation in the medical record.   PROCEDURAL NOTE:  O2 saturation, blood pressure, pulse and respirations were  monitored throughout entire procedure.   CONSCIOUS SEDATION:  Versed 6 mg IV, Demerol 125 mg IV in divided doses.  Cetacaine spray for topical oropharyngeal anesthesia.   INSTRUMENT:  Olympus video chip system.   ESOPHAGOGASTRODUODENOSCOPIC FINDINGS:  Examination of the tubular esophagus  revealed no mucosal abnormalities.  The tubular esophagus was widely patent  through the EG junction.   STOMACH:  Gastric cavity was empty and insufflated well with air.  Thorough  examination of the gastric mucosa including a retroflexed view of the  proximal stomach and esophagogastric junction demonstrated no abnormalities,  aside from a small hiatal hernia and some deformity of  the antrum in the  prepyloric area; please see photos.  The pylorus was patent and easily  traversed.  Examination of the bulb and second portion revealed no  abnormalities.   THERAPEUTIC/DIAGNOSTIC MANEUVERS PERFORMED:  A 58-French Maloney dilator was  passed with ease to full insertion.  A look back revealed a small mucosal  rent at the level of the upper esophageal sphincter, otherwise, no  complications related to passage of the dilator.  The patient tolerated the  procedure well and was prepared for colonoscopy.  Digital rectal exam  revealed no abnormalities.   ENDOSCOPIC FINDINGS:  Prep was adequate.   RECTUM:  Examination of the rectal mucosa with a retroflexed view of the  anal verge revealed no abnormalities.   COLON:  The colonic mucosa was surveyed from the rectosigmoid junction  through the left, transverse and right colon to the area of the appendiceal  orifice, ileocecal valve and cecum.  These structures were well-seen and  photographed for the record.  From this level, the scope was slowly  withdrawn.  All previously noted mucosal surfaces were again seen.  The  patient had four 5-mm polyps in  and about the area of the hepatic flexure;  they were removed with snare cautery.  The remainder of the colonic mucosa  appeared normal.   As noted above, I was unable to identify a single diverticulum.  The patient  tolerated both procedures well and was reactive at endoscopy.   IMPRESSION:   ESOPHAGOGASTRODUODENOSCOPY:  1.  Normal-appearing tubular esophagus/esophageal mucosa, status post      dilation as described above.  2.  Small hiatal hernia and deformity of the prepyloric antral area,      otherwise normal gastric mucosa and patent pylorus, normal first      duodenum and second duodenum.   COLONOSCOPY FINDINGS:  1.  Normal rectum.  2.  Small pedunculated polyps at the hepatic flexure, removed with snare      cautery technique.  3.  Remainder of colonic mucosa  appeared normal.   RECOMMENDATIONS:  1.  Continue Nexium 40 mg orally daily.  2.  Follow up on pathology.  3.  If the patient has further difficulty swallowing, then we will need to      consider the possibility of underlying esophageal motility disorder as      the contributing factor to his dysphagia symptoms.      Jonathon Bellows, M.D.  Electronically Signed     RMR/MEDQ  D:  09/02/2005  T:  09/03/2005  Job:  [id number]   cc:   [name], MD  Fax: [phone]

## 2010-09-12 NOTE — H&P (Signed)
NAME:  Miguel Mendoza, Miguel Mendoza                         ACCOUNT NO.:  [id number]   MEDICAL RECORD NO.:  [id number]                   PATIENT TYPE:  INP   LOCATION:  2032                                 FACILITY:  [facility]   PHYSICIAN:  Salvatore Decent. Cornelius Moras, M.D.              DATE OF BIRTH:  1940-12-31   DATE OF ADMISSION:  01/24/2002  DATE OF DISCHARGE:                                HISTORY & PHYSICAL   PRIMARY CARE PHYSICIAN:  Dr. [name].   CARDIOLOGIST:  Dr. Learta Codding.   HISTORY OF PRESENT ILLNESS:  Miguel Mendoza is a 70 year old male who underwent  coronary artery bypass graft surgery x3 in 12/01/01. His initial postoperative  and post discharge course has been relative unremarkable. On his three week  visit with Dr. Cornelius Moras, his median sternotomy incision as well as the incision  in the right lower extremity were both well healed. He now presents with a  two-week history of drainage from the distal portion of his midline  sternotomy. He said it developed from a small pustule at the end of the  incision and became a quarter-sized long. He said he changed shirts three  times daily. He has been treated with a course of Biaxin ending about four  to five days ago. He was presented to [facility] today because he is  feeling greater pain, not only at the site of the drainage but up along the  sternal incision to mid chest. The drainage today has somewhat abated.  Attempts to express matter for culture were unsuccessful today, but a swab  which was passed through the quarter-sized wound/eruption tracked well up  along underneath the sternal incision. He will be admitted, started on IV  antibiotics, and have a computer tomogram of the chest. The patient denies  recent history of fevers, chills, or anorexia, fatigue, or weakness.   ALLERGIES:  1. Ibuprofen.  2. Codeine.  3. Questionable rash with vancomycin which was administered immediately     after CABG in 8/03.   MEDICATIONS:  1.  Enteric-coated aspirin 325 mg daily.  2. Glucotrol XL 10 mg daily.  3. Altace 2.5 mg daily.  4. Toprol-XL 25 mg daily.  5. Lipitor 20 mg p.o. q.h.s.  6. Xanax 0.5 mg p.r.n.  7. Darvocet N-100 one to two tablets p.o. q.3-4h, p.r.n. pain.   PAST MEDICAL HISTORY:  1. Coronary artery disease, admitted to [facility] 11/30/01 with     Class IV unstable angina, history of myocardial infarction, history of     myocardial infarction with stenting of the LAD in 1998.  2. Ventricular fibrillation at time of left heart catheterization 11/30/01.  3. Type 2 diabetes mellitus.  4. Hypertension.  5. Hypercholesterolemia.  6. History of chronic back pain.  7. History of tobacco habituation, having stopped at the time of his     coronary artery bypass graft in 8/03.  8.  History of CVA in 1997.  9. Reflux.  10.      Obesity.   PAST SURGICAL HISTORY:  1. PTCA, stenting, of the LAD in 1998.  2. Left heart catheterization 11/30/01 demonstrating severe three vessel     atherosclerotic coronary artery disease.  3. 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  4. Status post cervical diskectomy with fusion and lumbar disk surgery.  5. Inguinal herniorrhaphy.   Miguel Mendoza denies any prior history of peptic ulcer disease, kidney disease.  He has no diagnosis of cancer. He has no recent fever or chills. He has  recently lost 60 pounds.   SOCIAL HISTORY:  He is married, has four children. He has a history of  tobacco habituation, having quit in 8/03. He does not partake of alcoholic  beverages. Currently on disability secondary to chronic back problems.  Worked as a Printmaker.   FAMILY HISTORY:  Has no risk factors for CAD.   PHYSICAL EXAMINATION:  VITAL SIGNS:  Temperature 97.8, blood pressure  140/90, pulse 60 and regular, respirations 20, oxygen saturation room air  96%.  HEENT:  Pupils are equal, round, and reactive to light. Extraocular  movements are intact. Oropharynx shows there are no lesions or  erythema.  Tongue is midline.  NECK:  No jugular venous distention. No carotid bruits auscultated.  LUNGS:  Clear to auscultation and percussion bilaterally.  HEART:  Regular, rate, and rhythm. Telemetry sinus rhythm,  ABDOMEN:  Soft, nondistended, mildly obese. Bowel sounds present throughout.  CHEST:  Midline sternotomy is tender but stable. Erythema is distal to the  midpoint and extends to the distal incision. The distal incision ends at a  quarter-sized skin breakdown which allows a small track up underneath the  incision to the midpoint of the sternotomy.  EXTREMITIES:  Shows no evidence of varicosities, ischemic changes, edema.  The incisions at the right knee are very well healed.  NEUROLOGICAL:  Grossly intact.   IMPRESSION:  Sternal wound infection.   PLAN:  Start IV Ancef, pain control. Computer tomogram of the chest to rule  out mediastinitis. Wound care.      Maple Mirza, P.A.                    Salvatore Decent. Cornelius Moras, M.D.    GM/MEDQ  D:  01/24/2002  T:  01/27/2002  Job:  [id number]

## 2010-09-12 NOTE — Discharge Summary (Signed)
NAMEWALFRED, BETTENDORF               ACCOUNT NO.:  [id number]   MEDICAL RECORD NO.:  [id number]          PATIENT TYPE:  INP   LOCATION:  3703                         FACILITY:  [facility]   PHYSICIAN:  Nicolasa Ducking, ANP DATE OF BIRTH:  [birth year]   DATE OF ADMISSION:  04/07/2006  DATE OF DISCHARGE:  04/08/2006                               DISCHARGE SUMMARY   PRIMARY CARE PHYSICIAN:  Dr. Lewayne Bunting.   PRIMARY CARE PHYSICIAN:  Dr. [name] in [place].   PRINCIPAL DIAGNOSIS:  Chest pain.   SECONDARY DIAGNOSES:  1. Coronary artery disease, status post myocardial infarction and      coronary artery bypass graft x3 in 2003.  2. Hypertension.  3. Type 2 diabetes mellitus.  4. Hyperlipidemia.  5. Obesity.  6. Ongoing tobacco abuse.  7. Status post cerebrovascular accident in 1997.  8. Status post multiple low back surgeries.  9. Status post anterior cervical diskectomy with fusion.  10.Peripheral vascular disease.  11.Status post left carotid endarterectomy.  12.Ischemic cardiomyopathy, ejection fraction 45 to 50% by echo in      January of 2005.  13.Mild pulmonary hypertension.  14.Syncope of unclear etiology.  15.Migraines.   ALLERGIES:  IBUPROFEN, CODEINE, PHENERGAN, VANCOMYCIN.   PROCEDURE:  Left heart cardiac catheterization.   HISTORY OF PRESENT ILLNESS:  A 70 year old Caucasian male with a  previous history of CAD as outlined above who presented to [facility] on April 07, 2006, after waking at 2 a.m. that morning with  chest discomfort, shortness of breath, and smothering sensation.  Symptoms were associated with diaphoresis and clamminess, and he  subsequently called 911.  Symptoms resolved en route to [facility] with sublingual nitroglycerin and Zofran in the emergency room.  He was seen by Dr. Andee Lineman in consultation on December 12, and decision  was made to transfer him to [facility] for further evaluation and  catheterization.   HOSPITAL COURSE:   Mr. Thornsberry underwent left heart catheterization on  December 13 revealing a total occlusion of the native RCA with otherwise  nonobstructive native coronary artery disease.  The vein graft to the  distal RCA as well as the vein graft to the LPL were widely patent.  The  LIMA to the LAD was atretic.  EF was 55% with inferior hypokinesis.  As  there was no culprit for his chest pain, a D-dimer was performed.  Postcatheterization was normal at 0.22.  A BNP was also performed; it  was normal at 140.  Mr. Kohles has been ambulating without any recurrent  discomfort.  He is being discharged home today in satisfactory  condition.   DISCHARGE LABS:  Hemoglobin 14.3, hematocrit 43.5, WBC 6.0, platelets  204.  PT 13.8, INR 1.0, PTT 26, D-dimer 0.22.  Sodium 140, potassium  4.0, chloride 105, CO2 28, BUN 10, creatinine 0.6, glucose 104.  Total  bilirubin 1, alkaline phosphatase 70, AST 27, ALT 20, total protein 6.3,  albumin 3.4, calcium 9.0, hemoglobin A1c 6.6.  CK 35, MB 0.9, Troponin I  0.02, BNP 140.  TSH 1.007.   DISPOSITION:  Patient is  being discharge home today in good condition.   FOLLOWUP PLANS AND APPOINTMENTS:  He has a followup appointment with Dr.  Lewayne Bunting in our [facility] office April 29, 2005, at 1 p.m.  He was asked  to follow up with his primary care physician, Dr. Sherryll Burger in [place] in 3-4  weeks.   DISCHARGE MEDICATIONS:  1. Aspirin 81 mg daily.  2. Plavix 75 mg daily.  3. Metoprolol 100 mg q. a.m., 50 mg q.p.m.  4. Lipitor 20 mg daily.  5. Norvasc 10 mg daily.  6. Flexeril and Xanax as previously described.  7. Nitroglycerin 0.4 mg sublingual p.r.n. chest pain.   OUTSTANDING LABS/STUDIES:  None.   DURATION OF DISCHARGE ENCOUNTER:  40 minutes including physician time.      Nicolasa Ducking, ANP     CB/MEDQ  D:  04/08/2006  T:  04/09/2006  Job:  [id number]   cc:   [name], MD

## 2010-09-12 NOTE — Cardiovascular Report (Signed)
NAME:  Miguel Mendoza, Miguel Mendoza                         ACCOUNT NO.:  [id number]   MEDICAL RECORD NO.:  [id number]                   PATIENT TYPE:  INP   LOCATION:  4714                                 FACILITY:  [facility]   PHYSICIAN:  Salvadore Farber, MD [facility]            DATE OF BIRTH:  Nov 18, 1940   DATE OF PROCEDURE:  11/30/2001  DATE OF DISCHARGE:                              CARDIAC CATHETERIZATION   PROCEDURES:  Coronary angiography.   INDICATIONS:  The patient is a 70 year old gentleman, status post stenting  of the LAD in 1998.  He now presents with two days of episodic chest  discomfort associated with nausea and one episode with severe palpitations.  He was admitted to [facility] where serial enzymes ruled out  myocardial infarction.  He is transferred for diagnostic angiography.   DIAGNOSTIC TECHNIQUE:  Informed consent was obtained. Under 2% lidocaine  local anesthesia, a 6 French sheath was placed in the right femoral artery  using the modified Seldinger technique. Coronary arteries were engaged using  JL4 and JR4 catheters.  There was no catheter damping with either one.  Angiography was performed by hand injection.  Following the procedure,  sheaths were removed.   COMPLICATIONS:  After two injections of the right coronary artery, the  patient developed ventricular fibrillation, which spontaneously terminated.  He did not require defibrillation.   FINDINGS:  1. Left main:  There was a hazy 60% focal stenosis in the distal vessel.  2. LAD:  The vessel is moderate sized and gives rise to two obtuse marginal     arteries. There is a stent in the mid vessel which jails the second     diagonal branch. The stent is widely patent. The distal vessel appears     bypassable.  3. Circumflex:  The circumflex is a moderate sized vessel which gives rise     to a single obtuse marginal vessel. This vessel appears bypassable.  4. RCA:  The RCA is a large vessel which gives rise to the  PDA. There is a     60% stenosis of the proximal vessel and 50% of the distal vessel. The PDA     and PLV appear to be bypassable.   IMPRESSION:  The patient has unstable angina with an unstable appearing  distal left main stenosis. His palpitations with pain prior to admission are  concerning for ischemic ventricular tachycardia when viewed in light of his  procedural arrhythmias.   PLAN:  Will refer to Cardiovascular Thoracic Surgery for consideration of  coronary artery bypass grafting. Will obtain an echocardiogram, carotid  ultrasound. Will resume heparin four hours after sheath pull. Will begin  aspirin.  Salvadore Farber, MD [facility]    WED/MEDQ  D:  11/30/2001  T:  12/05/2001  Job:  [id number]

## 2010-09-12 NOTE — Cardiovascular Report (Signed)
NAMEAAKASH, Miguel Mendoza               ACCOUNT NO.:  [id number]   MEDICAL RECORD NO.:  [id number]          PATIENT TYPE:  INP   LOCATION:  3736                         FACILITY:  [facility]   PHYSICIAN:  Rollene Rotunda, M.D.   DATE OF BIRTH:  1940/06/17   DATE OF PROCEDURE:  08/07/2005  DATE OF DISCHARGE:                              CARDIAC CATHETERIZATION   PRIMARY CARE PHYSICIAN:  [name], M.D.   CARDIOLOGIST:  Treasa School, M.D.   PROCEDURE:  Left heart catheterization, left coronary arteriography.   INDICATIONS FOR PROCEDURE:  Evaluate patient with chest pain (786.51) and  previous bypass grafting.   DESCRIPTION OF PROCEDURE:  Left heart catheterization was performed via the  right femoral artery. The artery was cannulated using anterior wall  puncture. A #6 French arterial sheath was inserted via the modified  Seldinger technique. A pre-form Judkins and pigtail catheter were utilized.  The patient tolerated the procedure well and left the lab in stable  condition.   RESULTS:  HEMODYNAMICS:  LV1 17/13, AO 117/84.   CORONARIES:  The left main had distal 30% stenosis. The LAD was large,  wrapping the apex. There was proximal calcification. There was long 25%  stenosis. There was a mid stent, which was widely patent. The first diagonal  was large and normal. The second diagonal was small with ostial 60%  stenosis. The circumflex in the mid AV groove had 80% long stenosis. There  was an obtuse marginal, which was free of disease and seemed to fail  predominantly via graft. The right coronary artery was occluded in the mid  segment after severe diffuse disease. The PDA and posterolateral's were seen  via grafts. The PDA was large and normal. The posterolateral x2 were large  and normal.   GRAFTS:  LIMA to the LAD was atretic. The saphenous vein graft to the obtuse  marginal was widely patent. A saphenous vein graft to the PDA was widely  patent.   CONCLUSION:  Native 3 vessel  coronary artery disease. The vein grafts are  patent. The LIMA is atretic. However, there is excellent blood flow through  the native LAD, through the patent stent. He has a mildly reduced ejection  fraction.   PLAN:  At this point, no further cardiovascular testing is indicated. He can  follow up with his primary care doctor for evaluation of non-anginal chest  pain. He needs to have continued education, particularly about stopping  smoking, which I did again today. He will need continued aggressive  secondary risk reduction.           ______________________________  Rollene Rotunda, M.D.     JH/MEDQ  D:  08/07/2005  T:  08/08/2005  Job:  [id number]   cc:   [name], MD  Fax: [phone]

## 2010-09-12 NOTE — Consult Note (Signed)
NAMEJOHNE, BUCKLE               ACCOUNT NO.:  [id number]   MEDICAL RECORD NO.:  [id number]          PATIENT TYPE:  INP   LOCATION:  3736                         FACILITY:  [facility]   PHYSICIAN:  R. Roetta Sessions, M.D. DATE OF BIRTH:  December 26, 1940   DATE OF CONSULTATION:  08/26/2005  DATE OF DISCHARGE:  08/08/2005                                   CONSULTATION   GI CONSULT:   PRIMARY CARE PHYSICIAN:  Dr. Clelia Croft   REASON FOR CONSULTATION:  Right-sided abdominal pain/dysphagia.  Patient  states I can't swallow.   HISTORY OF PRESENT ILLNESS:  Mr. Rayos is a 70 year old Caucasian male  patient of Dr. Alver Fisher.  Apparently, he was treated for diverticulitis around  March 15 of this year.  He was given seven days of Cipro b.i.d.  He states  he had had some right lower quadrant/right hip pain.  He denied any fever,  chills, nausea, vomiting, or diarrhea with this pain.  Currently the pain  has resolved.  He does complain of chronic indigestion with symptoms at  least two to three times a week.  He has been on Nexium for several years on  an as needed basis.  This does seem to help.  More recently, over the last  three months he has had progressive dysphagia to both solids and liquids.  He complains of regurgitation of his undigested food as well.  He states  that he feels his food gets stuck and points to his upper esophagus.  He  then has to wash it down with water.  He denies any odynophagia, anorexia,  early satiety.  His bowel movements have been normal, soft, and brown.  Denies any rectal bleeding or melena.  He states he has never had a  colonoscopy.   PAST MEDICAL HISTORY:  1.  He was treated for diverticulitis July 09, 2005.  2.  Hypercholesterolemia.  3.  Chronic GERD.  4.  Diabetes mellitus for 10 years.  5.  Coronary artery disease status post CABG and MI in 2003.  6.  Hypertension.  7.  Anxiety.  8.  He has cervical disk disease and has had surgery for this as well as  three back surgeries.  9.  He has had bilateral inguinal hernia repairs.   CURRENT MEDICATIONS:  1.  Lipitor 20 mg daily.  2.  Nitro-Quick 0.4 mg sublingual p.r.n.  3.  Metformin 500 mg two p.o. b.i.d.  4.  Hydrocodone 5/500 mg t.i.d.  5.  Norvasc 10 mg daily.  6.  Flexeril 10 mg t.i.d.  7.  Xanax 1 mg b.i.d.  8.  Plavix 75 mg daily.  9.  Nexium 40 mg p.r.n.  10. Metoprolol 100 mg in the morning and 50 mg in the evening.  11. Aspirin 81 mg daily.   ALLERGIES:  IBUPROFEN causes hives.   FAMILY HISTORY:  He states his mother had diverticulitis.  She then  developed a Staph infection and was succumbed to this.  He felt like she had  cancer as well, although he does not have details on this.  Father age 73,  deceased secondary to coronary artery disease.  He has four siblings alive  who are relatively healthy.  One brother deceased secondary to alcoholism.   SOCIAL HISTORY:  Mr. Macomber is currently divorced.  He has four grown  healthy children.  He is retired from the [employer].  He reports a 64-  pack-year history of tobacco use, although he states he currently consumes  about half a pack a day.  Denies any alcohol or drug use.   REVIEW OF SYSTEMS:  CONSTITUTIONAL:  He denies any fatigue.  CARDIOVASCULAR:  Denies any chest pain or palpitations.  PULMONARY:  Denies shortness of  breath, dyspnea, cough, hemoptysis.  GI:  See HPI.  GU:  Denies any dysuria,  hematuria or urinary frequency.   PHYSICAL EXAMINATION:  VITAL SIGNS:  Weight ____ pounds, height _________  inches, temperature _____, blood pressure 132/70, pulse 54.  GENERAL:  Mr. Parrillo is a 70 year old obese Caucasian male who is alert,  oriented, pleasant, cooperative, in no acute distress.  HEENT:  Sclerae clear, non-icteric _____ oropharynx pink and moist without  lesions.  NECK:  Supple without any mass, thyromegaly.  CHEST/HEART:  Regular rate and rhythm.  He does have a 2/6 murmur noted.  LUNGS:   Emphysematous changes throughout.  No acute distress.  ABDOMEN:  Protuberant, obese with positive bowel sounds x4.  No bruits  auscultated.  Liver is palpable two to three fingerbreadths from the right  costal margin and unable to palpate splenomegaly, although examination is  limited given patient's body habitus.  There is no rebound tenderness or  guarding.  EXTREMITIES:  Without clubbing or edema bilaterally.  SKIN:  Pink, warm, and dry.  He does have multiple ecchymosis to upper and  lower extremities bilaterally.   ASSESSMENT:  Mr. Furtick is a 70 year old Caucasian male with two concerns  today.  1.  Suspected diverticulitis in March 2007.  Denies any abdominal pain at      this time.  His symptoms have resolved.  He has never had a colonoscopy      and therefore we need to proceed with this to rule out occult      malignancy.  2.  He has a three-month history of progressive dysphagia to both solids and      liquids with an underlying history of chronic gastroesophageal reflux      disease.  He may have developed web, ring, or stricture and may need to      be dilated.   PLAN:  1.  Will schedule colonoscopy and EGD with Dr. Jena Gauss in near future.  I have      discussed both procedures including risks and benefits include, but not      limited to, bleeding, infection, perforation, drug reaction ____.  He is      going to hold his aspirin and Plavix for four days prior to procedure.      Will give him SBE prophylaxis given him heart murmur and he is to take      half his Metformin the day prior to and of the procedure.  2.  He is currently on Nexium 40 mg p.r.n. basis.  He may benefit from daily      PPI.  3.  Further recommendations pending procedure.   We would like to thank Dr. Clelia Croft for allowing [place] to participate in the care  of Mr. Shomaker.      Nicholas Lose, N.P.      Jonathon Bellows,  M.D.  Electronically Signed   KC/MEDQ  D:  08/26/2005  T:  08/27/2005   Job:  [id number]   cc:   Clelia Croft, M.D.  [place], [place]

## 2010-09-12 NOTE — Assessment & Plan Note (Signed)
[facility]                          [facility] OFFICE NOTE   NAME:Barfield, LIZZIE COKLEY                      MRN:          [id number]  DATE:04/29/2006                            DOB:          12/05/1940    HISTORY OF PRESENT ILLNESS:  The patient is a 70 year old male with a  history of coronary artery disease, status post coronary bypass graft.  The patient was recently seen at [facility] with substernal chest  pain, he was referred for catheterization.  The patient was found to  have patent grafts, but with a total occlusion of a native right  coronary artery.  His ejection fraction was 55%.  The patient was also  ruled out for pulmonary embolism.  His BMP level was also within normal  limits.  He now states that he is doing quite well.  He has no shortness  of breath, orthopnea, PND, has no palpitations or syncope.   MEDICATIONS:  1. Meridia 10 mg p.o. daily.  2. Aspirin 325 mg daily.  3. Metformin 500 mg 2 tablets p.o. b.i.d.  4. Glimepiride 2 mg p.o. b.i.d.  5. Nexium 40 mg a day.  6. Metoprolol 100 mg 1 tablet in the morning, and 1/2 in the p.m.  7. Plavix 75 daily.  8. Flexeril 10 mg p.o. t.i.d.  9. Norvasc 10 mg p.o. daily.   PHYSICAL EXAMINATION:  VITAL SIGNS:  Blood pressure 155/88, heart rate  72, weight is 207 pounds.  NECK:  Normal carotid upstroke, no bruits.  LUNGS:  Clear.  HEART:  Regular rate and rhythm.  ABDOMEN:  Soft.  EXTREMITIES:  No edema.   PROBLEM LIST:  1. Coronary artery disease      a.     Status post relook catheterization with patent grafts.      b.     Status post coronary bypass grafting.  2. Hypertension.  3. Type 2 diabetes mellitus.  4. Status post CVA.  5. Status post left carotid endarterectomy.   PLAN:  .   PLAN:  1. The patient is doing well from a cardiovascular perspective.  I      have made no change in medical regimen.  2. The patient can follow up with Dr. Sherryll Burger regarding his  lipid      management.  3. The patient will see [place] back in 6 months.     Learta Codding, MD,FACC  Electronically Signed    GED/MedQ  DD: 04/29/2006  DT: 04/29/2006  Job #: [id number]   cc:   [name], MD

## 2010-09-12 NOTE — Discharge Summary (Signed)
NAME:  Miguel Mendoza, Miguel Mendoza                         ACCOUNT NO.:  [id number]   MEDICAL RECORD NO.:  [id number]                   PATIENT TYPE:  INP   LOCATION:  2018                                 FACILITY:  [facility]   PHYSICIAN:  Salvatore Decent. Cornelius Moras, M.D.              DATE OF BIRTH:  December 04, 1954   DATE OF ADMISSION:  11/30/2001  DATE OF DISCHARGE:  12/05/2001                           DISCHARGE SUMMARY - REFERRING   REFERRING PHYSICIAN:  [name], M.D.   ADMISSION DIAGNOSES:  1. Unstable angina with progressive anginal symptoms, rule out myocardial     infarction.  2. Status post percutaneous transluminal coronary angioplasty and stent     placement with anterior wall myocardial infarction, December 1998.  3. Hypertension.  4. Adult-onset diabetes mellitus type 2.  5. Hypercholesterolemia.  6. History of chronic back pain with cervical and lumbar disc surgery.  7. Ongoing tobacco use.  8. History of cerebrovascular accident in 1997.   DISCHARGE DIAGNOSES:  1. Severe left main coronary artery disease, 60%, right coronary artery     stenosis with unstable angina.  2. Status post anterior wall myocardial infarction; percutaneous     transluminal coronary angioplasty and stenting in December 1998.  3. Hypertension.  4. Adult-onset diabetes mellitus type 2, non-insulin dependent.  5. Hypercholesterolemia.  6. Ongoing tobacco use.  7. History of cerebrovascular accident in 1997.  8. Postoperative anemia with thrombocytopenia.  9. Postoperative rash questionably related to vancomycin.   PROCEDURES PERFORMED:  1. Cardiac catheterization on November 30, 2001.  2. Coronary artery bypass grafting x3, left internal mammary artery to the     left anterior descending, saphenous vein graft to the diagonal and     saphenous vein graft to the circumflex on December 01, 2001, by Dr. Cornelius Moras.   HISTORY OF PRESENT ILLNESS:  The patient is a 70 year old gentleman status  post stenting of his LAD in 1998  after an anterior wall myocardial  infarction.  He presented to [facility] the evening with two days of  episodic chest pain associated with nausea with severe palpitations.  He was  admitted to [facility].  Serial enzymes were negative and myocardial  infarction was ruled out.  He was transferred to [facility] for  cardiac catheterization and further treatment as indicated.   PAST MEDICAL HISTORY:  As enumerated above.   MEDICATIONS ON ADMISSION:  1. Toprol XL 25 mg q.d.  2. Glucotrol XL 10 mg q.a.m.  3. Xanax 0.5 mg q.a.m.  4. Prozac 90 mg q.d.  5. Lipitor per the TNT trial.   ALLERGIES:  IBUPROFEN causes an itch and CODEINE is also listed.   For further history and physical please see the dictated note.   HOSPITAL COURSE:  The patient was admitted on November 30, 2001, and taken to  the catheterization laboratory that day by Dr. Salvadore Farber.  He was  found to have a  60% focal stenosis of the left main in the distal portion of  the vessel.  The LAD was a moderate-size vessel which gave off two obtuse  marginals.  There was a stent in the mid vessel which was widely patent.  The distal vessel appeared bypassable.  The circumflex is a moderate-size  vessel giving rise to a single obtuse marginal.  The RCA has a 60% stenosis  of the proximal vessel and 50% stenosis of the distal vessel.  The PDA and  PDL appear bypassable.  The patient developed ventricular fibrillation after  two injections of the RCA which terminated spontaneously and did not require  defibrillation.  After completion of the study it was Dr. Melinda Crutch opinion  that the patient had unstable angina and required coronary artery bypass  grafting.  He was referred to cardiovascular and thoracic surgery and Dr.  Cornelius Moras.  Dr. Cornelius Moras reviewed the studies and agreed.  The patient was scheduled  for surgery.  The risks and benefits were discussed in detail prior to  surgery.  Preoperative Doppler studies  showed ABIs greater than 1  bilaterally.  Allen test bilaterally showed decreases of greater than 50%  with normal compression.  There was a 40 to 60% stenosis of the right  internal carotid artery and mid range vertebral flow was antegrade.  There  was no significant stenosis of the left internal carotid artery, and  vertebral flow was also antegrade on the left.   On December 01, 2001, the patient was taken to the operating room and underwent  the procedures described above.  Transesophageal echocardiogram revealed  normal LV function both before and after cardiopulmonary bypass.  He came  off cardiopulmonary bypass in sinus rhythm.  He was transferred to the ICU  where he remained stable overnight.  He developed a diffuse rash which was  erythematous and suspicious for a vancomycin reaction.  Vancomycin was  discontinued.  He remained hemodynamically stable and was extubated the  following morning.  He was mobilized.  His mediastinal tubes were removed.  He remained stable throughout the first postoperative day and was  transferred to the floor.   He was started on progressive ambulation postoperatively.  He had some mild  thrombocytopenia.  Hemoglobin was 8.9 with a hematocrit of 27.  He showed  good progress and by December 05, 2001, it was Dr. Orvan July opinion he was ready  for discharge.  His wounds are healing nicely.  Telemetry shows sinus rhythm  with occasional PVCs.  The patient is ready for discharge.   DISCHARGE MEDICATIONS:  1. Aspirin 325 mg one q.d.  2. Glucotrol XL 10 mg q.d.  3. Lipitor per the TNT study.  4. Toprol XL 25 mg q.d.  5. Prozac 90 mg q.d.  6. Xanax 0.5 mg q.a.m. as needed.   These are all his pre-admission medications.  His new medications will be   1. Altace 2.5 mg q.d.  2. Darvocet 1-2 p.o. q.4-6h. p.r.n. for pain.   DISCHARGE INSTRUCTIONS:  He is to walk daily, no lifting over 10 pounds, no driving, and no strenuous activities.  He is to call Dr. Clelia Croft  if he has  elevated sugars.  His pain medication will be Darvocet or Tylenol.   DISCHARGE DIET:  Low-fat, diabetic diet.   WOUND CARE:  He is to clean his incision with plain soap and water.   FOLLOWUP APPOINTMENTS:  He will return to see Dr. Diona Browner for [facility] [place] office, on  Thursday, August 21, at 3:30 p.m., and will  return to see Dr. Cornelius Moras on Monday, August 25, at 9:30 a.m.  He is to bring  his chest x-ray from Dr. Ival Bible office to Dr. Orvan July office for Dr. Cornelius Moras  to view.  He is also instructed to discontinue smoking.  He has two  prescriptions written so they can help him with his medications prior to  discharge.  He has all his home medications as before, and his new  medications of Darvocet and Altace.   DISCHARGE LABORATORY:  His hemoglobin was 8.5, hematocrit 25.6, white blood  cell count 5.3, and platelet count 111,000.  Electrolytes are normal.  BUN  is 8, creatinine 0.6, glucose 100.  CBGs are well controlled.   CONDITION ON DISCHARGE:  Improved.     Eber Hong, P.A.                 Salvatore Decent. Cornelius Moras, M.D.    WDJ/MEDQ  D:  12/05/2001  T:  12/08/2001  Job:  [id number]   cc:   Luis Abed, M.D. [facility]   [name], M.D.  [facility]  [place], [place] Robbie Lis   Jonelle Sidle, M.D. [facility]

## 2010-09-12 NOTE — Discharge Summary (Signed)
NAMEJESSEN, SIEGMAN               ACCOUNT NO.:  [id number]   MEDICAL RECORD NO.:  [id number]          PATIENT TYPE:  INP   LOCATION:  3736                         FACILITY:  [facility]   PHYSICIAN:  Learta Codding, M.D. LHCDATE OF BIRTH:  [birth year]   DATE OF ADMISSION:  08/06/2005  DATE OF DISCHARGE:  08/08/2005                                 DISCHARGE SUMMARY   PRIMARY CARE PHYSICIAN:  [name], M.D.   PRINCIPAL DIAGNOSIS:  Unstable angina.   OTHER DIAGNOSES:  1.  Coronary artery disease status post coronary artery bypass grafting x3      in 2003 with postoperative course complicated by sternal wound infection      requiring debridement.  2.  History of anterior myocardial infarction in 1998.  3.  History of thrombocytopenia and rash with vancomycin.  4.  Hypertension.  5.  History of cerebrovascular accident in 1997.  6.  Degenerative joint disease with multiple back surgeries.  7.  Peripheral vascular disease status post left carotid endarterectomy with      mild to moderate right carotid artery stenosis.  8.  Migraine headaches.  9.  Depression with previous suicide attempt.  10. Obstructive sleep apnea.  11. History of syncope.  12. Medical non-adherence.   ALLERGIES:  CODEINE, IBUPROFEN, PHENERGAN, VANCOMYCIN.   PROCEDURE:  Left heart cardiac catheterization.   HISTORY OF PRESENT ILLNESS:  A 70 year old male with prior history of  coronary artery disease status post coronary artery bypass grafting x3 in  2003, who presented to the [facility] emergency room on August 06, 2005 secondary to a 2 to 3 day history of left sided sharp atypical chest  pain, radiating to the neck and back. Symptoms had been waxing and waning  prior to admission. In the emergency room, he had no acute EKG changes.  Notably, pain was reproducible with palpation of the left chest. Also for 3  days prior to admission, he complained of worsening dyspnea as well as  orthopnea, prompting the  decision to transfer him to [facility] for  further evaluation and left heart cardiac catheterization.   HOSPITAL COURSE:  Cardiac catheterization was performed on August 07, 2005,  revealing native 3 vessel coronary artery disease with a patent stent in the  mid LAD. His LIMA to the LAD was atretic with patent vein grafts to the  obtuse marginal and posterior descending artery. Ejection fraction was 45%  with mild global hypokinesis and inferior akinesis. This morning, he has  been ambulating without recurrent chest discomfort and he is being  discharged home today in satisfactory condition.   LABORATORY DATA:  On discharge hemoglobin and hematocrit 13.2 and 39.4.  White blood cell count 6.4. Platelets 146,000. Sodium 139, potassium 3.9,  chloride 101, CO2 30, BUN 10, creatinine 0.7, glucose 131. Calcium 9.1.  Cardiac markers negative x2.   DISPOSITION:  The patient is being discharged home today in good condition.   FOLLOW UP:  He will followup with Dr. Andee Lineman or Arnette Felts, [facility], in our [place]  office in approximately 2 weeks.  1.  He  is asked to followup with his primary care physician, Dr. Sherryll Burger, in      approximately 3 to 4 weeks.   DISCHARGE MEDICATIONS:  1.  Aspirin 81 mg daily.  2.  Metoprolol 100 mg q. A.m. and 50 mg q. P.m.  3.  Plavix 75 mg daily.  4.  Flexeril 10 mg t.i.d.  5.  Lipitor 20 mg daily.  6.  Alprazolam 1 mg b.i.d.  7.  Norvasc 10 mg daily.  8.  Nitroglycerin 0.4 mg sublingual p.r.n. chest pain.   OUTSTANDING LABORATORY STUDIES:  None.   DURATION OF DISCHARGE ENCOUNTER:  35 minutes including physician time.      Ok Anis, NP      Learta Codding, M.D. [facility]  Electronically Signed    CRB/MEDQ  D:  08/08/2005  T:  08/08/2005  Job:  [id number]   cc:   [name], MD  Fax: [phone]

## 2010-09-12 NOTE — Discharge Summary (Signed)
NAME:  Miguel Mendoza, Miguel Mendoza                         ACCOUNT NO.:  [id number]   MEDICAL RECORD NO.:  [id number]                   PATIENT TYPE:  INP   LOCATION:  2032                                 FACILITY:  [facility]   PHYSICIAN:  Salvatore Decent. Cornelius Moras, M.D.              DATE OF BIRTH:  [birth year]   DATE OF ADMISSION:  01/24/2002  DATE OF DISCHARGE:  01/29/2002                                 DISCHARGE SUMMARY   ADMISSION DIAGNOSIS:  Sternal wound infection.   PAST MEDICAL HISTORY:  1. Coronary artery disease, status post recent CABG in August of 2003.  2. Type 2 diabetes mellitus.  3. Hypertension.  4. Hypercholesterolemia.  5. History of chronic back pain.  6. History of tobacco use, quit August of 2003.  7. History of CVA in 1997.  8. Reflux.  9. Obesity.   PAST SURGICAL HISTORY:  Cervical diskectomy with fusion and lumbar disk  surgery.  Inguinal herniorrhaphy.   ALLERGIES:  IBUPROFEN, CODEINE, and a questionable rash with VANCOMYCIN  which was administered postoperative CABG in August of 2003.   DISCHARGE DIAGNOSIS:  Superficial median sternotomy wound infection.   HISTORY OF PRESENT ILLNESS:  The patient is a 70 year old Caucasian male.  His initial postoperative and post discharge course was unremarkable.  On  his visit with Dr. Cornelius Moras, his median sternotomy incision as well as incision  in the right lower extremity were both well-healed.  He presented on  September 30, with a two-week history of drainage from the distal portion of  his midline sternotomy.  He described it as developing from a small pustule  at the end of the incision which eventually became a quarter-size long.  He  described quite a lot of drainage.  He was treated with a course of Biaxin  which ended approximately four to five days prior to admission.  He  presented today because he is feeling greater pain at the site of the  drainage, but also along the sternal incision to his mid chest.  Swab was  taken for  culture.  Dr. Cornelius Moras planned for admission, initiation of  antibiotics, and CT scan of the chest.   HOSPITAL COURSE:  On September 30, the patient was admitted to [facility] under the care of Dr. Cornelius Moras.  A CT scan of the chest was obtained  which revealed no evidence of any sternal wound infection, not even any soft  tissue stranding.  He remained afebrile and with normal white blood cell  count.  On admission, blood cultures were obtained x2.  On October 3, one of  two blood cultures were returned as gram positive rods.  Etiologies were  possible contaminant, however, this will be treated as a positive of culture  and continue with IV Ancef.   On the morning of October 5, no further culture results were available.  The  patient remains with stable vital signs and  afebrile.  He reports feeling  okay. Pain is lessened.  His sternal wound is shallow, measures  approximately 1 cm x 2 cm diameter.  There is no drainage and no periwound  erythema.  As the patient's superficial wound is continuing to heal, it is  anticipated that he will be ready for discharge home tomorrow on oral  antibiotics.   CONDITION ON DISCHARGE:  Improved.   DISCHARGE MEDICATIONS:  1. Keflex 500 mg p.o. q.6h. x7 more days.  2. Aspirin 325 mg p.o. q.d.  3. Toprol XL 25 mg p.o. q.d.  4. Altace 2.5 mg p.o. q.d.  5. Lipitor 20 mg p.o. q.d.  6. Glucotrol 10 mg p.o. q.d.  7. Xanax 0.5 mg p.o. p.r.n.  8. Prozac 90 mg p.o. each week on Mondays.  9. Darvocet-N 100 one to two p.o. q.4-6h. p.r.n. for pain.   ACTIVITY:  He should continue to refrain from any heavy lifting and no  driving for now.   DIET:  Should remain a diabetic diet.   WOUND CARE:  He is to continue his dressing changes at home twice a day in  a.m. and p.m.  He is to clean gently with dilute nitrogen peroxide, cover it  with moist 2x2 gauze, and then cover with a dry gauze and tape.   FOLLOW UP:  Dr. Arbie Cookey would like to see him in the [facility]  office in  approximately one week. The office will call him with date and time of that  appointment.     Toribio Harbour, R.N.                  Salvatore Decent. Cornelius Moras, M.D.    CTK/MEDQ  D:  01/29/2002  T:  02/01/2002  Job:  [id number]

## 2010-09-12 NOTE — Op Note (Signed)
NAME:  NUMAIR, MASDEN                         ACCOUNT NO.:  [id number]   MEDICAL RECORD NO.:  [id number]                   PATIENT TYPE:  INP   LOCATION:  2309                                 FACILITY:  [facility]   PHYSICIAN:  Salvatore Decent. Cornelius Moras, M.D.              DATE OF BIRTH:  1940/05/29   DATE OF PROCEDURE:  12/01/2001  DATE OF DISCHARGE:                                 OPERATIVE REPORT   PREOPERATIVE DIAGNOSES:  Left main disease with class 4 unstable angina.   POSTOPERATIVE DIAGNOSES:  Left main disease with class 4 unstable angina.   OPERATION PERFORMED:  Median sternotomy for coronary artery bypass grafting  times three (left internal mammary artery to the distal left anterior  descending coronary artery, saphenous vein graft to circumflex marginal  branch, saphenous vein graft to distal right coronary artery).   SURGEON:  Salvatore Decent. Cornelius Moras, M.D.   ASSISTANT:  Jody P. Diamond Nickel.   ANESTHESIA:  General.   INDICATIONS FOR PROCEDURE:  The patient is a 70 year old obese white male  from [place], [place] with history of coronary artery disease, status  post percutaneous coronary intervention and stent placement of the left  anterior descending coronary artery in 1998.  He also has a history of  hypertension, hyperlipidemia, type 2 diabetes mellitus, and longstanding  tobacco abuse.  He presents with new onset symptoms of class 4 unstable  angina.  He was initially evaluated at [facility] and transferred to  [facility].  He underwent cardiac catheterization demonstrating  ulcerated 60 to 70% distal left main coronary stenosis as well as three-  vessel coronary artery disease.  The patient suffered a ventricular  fibrillation arrest during coronary injection but promptly stabilized  shortly thereafter.  A full consultation note has been dictated previously.  The patient and his family have been counseled at length regarding the  indications and potential  benefits of surgery.  Alternative treatment  strategies have been discussed.  All of their questions have been addressed.  They understand and accept all associated risks of surgery including but not  limited to risks of death, stroke myocardial infarction, bleeding requiring  blood transfusion, arrhythmia, infection, and recurrent coronary artery  disease.   DESCRIPTION OF PROCEDURE:  The patient was brought to the operating room on  the above mentioned date.  Central monitoring was established by the  anesthesia service under the care and direction of Dr. Sharee Holster.  Specifically, a Swann-Ganz catheter was placed into the right internal  jugular approach.  Intravenous antibiotics were administered.  A radial  arterial line was placed.  The patient was placed in the supine position on  the operating table.  Following induction with general endotracheal  anesthesia, a Foley catheter was placed.  The patient's chest, abdomen, both  groins and both lower extremities were prepped and draped in a sterile  manner.  Transesophageal echocardiogram was performed by  Dr. Jacklynn Bue.  This  demonstrates normal left ventricular function.  There were no significant  abnormalities of the aortic valve or mitral valve.  No other significant  abnormalities were identified.   A median sternotomy incision was performed and the left internal mammary  artery was dissected from the chest wall and prepared for bypass grafting.  The left internal mammary artery was noted to be good quality conduit.  Simultaneously saphenous vein was obtained from the patient's right thigh  and the upper portion of the right lower leg using endoscopic vein harvest  technique.  The saphenous vein was notably good quality conduit.  The  patient was heparinized systemically.   The pericardium was opened.  The ascending aorta was normal in appearance.  The ascending aorta and the right atrium were cannulated for  cardiopulmonary  bypass.  Adequate heparinization was verified.  Cardiopulmonary bypass was  begun and the surface of the heart was inspected.  There was some scar in  the distal anterior wall suggestive of old anterior apical myocardial  infarction.  Distal sites were selected for coronary artery bypass grafting.  Portions of saphenous vein and the left internal mammary artery were trimmed  to the appropriate lengths.  A temperature probe was placed in the left  ventricular septum  and a styrofoam pad was placed to protect the left  phrenic nerve from thermal injury.  A cardioplegia catheter was placed in  the ascending aorta.  The patient was cooled to 32 degrees systemic  temperature.  The aortic crossclamp was applied and cardioplegia was  delivered in an antegrade fashion through the aortic root.  Iced saline  slush was applied for topical hypothermia.  The initial cardioplegic arrest  and myocardial cooling were felt to be excellent.  Repeat doses of  cardioplegia were administered intermittently throughout the crossclamp  portion of the operation both through the aortic root and down subsequently  placed vein grafts to maintain septal temperature above 15 degrees  centigrade.  The following distal coronary anastomoses were performed.  (1)  The distal right coronary artery was grafted with a saphenous vein graft in  an end-to-side fashion.  This coronary artery measured 1.7 mm at the site of  distal bypass and was of fair to good quality.  (2)  The circumflex marginal  branch was grafted with a saphenous vein graft in end-to-side fashion.  This  coronary measures 1.8 mm in diameter and is of good quality.  (3)  The  distal left anterior descending coronary artery was grafted with the left  internal mammary artery in end-to-side fashion.  This coronary artery was  small measuring only 1.0 mm in diameter.  It was diffusely diseased proximally and the previous stent could be identified  in the midportion of  the left anterior descending coronary artery.  Both proximal saphenous vein  anastomoses were performed directly to the ascending aorta prior to removal  of the aortic crossclamp.  The aortic crossclamp was removed after a total  crossclamp time of 70 minutes.  The septal temperature was noted to rise  rapidly and dramatically upon reperfusion of the left internal mammary  artery.   The patient was rewarmed to greater than 37 degrees centigrade temperature.  All proximal and distal anastomoses were inspected for hemostasis and  appropriate graft orientation.  Epicardial pacing wires were affixed to the  right ventricular outflow tract and to the right atrial appendage.  Normal  sinus rhythm resumed spontaneously.  The patient was weaned  from  cardiopulmonary bypass without difficulty.  The patient's rhythm at  separation from bypass was normal sinus rhythm.  No inotropic support was  required.  Total cardiopulmonary bypass time for the operation was 85  minutes.  Follow-up transesophageal echocardiogram performed by Dr. Jacklynn Bue  after separation from bypass demonstrates normal left ventricular function  with no changes from preoperatively.   The venous and arterial cannulae were removed uneventfully.  Protamine was  administered to reverse the anticoagulation.  The mediastinum and left chest  were irrigated with saline solution containing vancomycin.  Meticulous  surgical hemostasis was ascertained.  The mediastinum and the left chest  were drained through three chest tubes placed through separate stab  incisions inferiorly.  The median sternotomy was closed in routine fashion.  The right lower extremity incisions were closed in multiple layers in  routine fashion.  All skin incisions were closed with subcuticular skin  closures.   The patient tolerated the procedure well and was transported to the surgical  intensive care unit in stable condition.  There were no  intraoperative  complications.  All sponge, needle and instrument counts were verified  correct at the completion of the operation.  No blood products were  administered.                                               Salvatore Decent. Cornelius Moras, M.D.    CHO/MEDQ  D:  12/01/2001  T:  12/05/2001  Job:  [id number]   cc:   Luis Abed, M.D. [facility]   Ashish Milus Banister, MD [facility]  [address] [place], [place] [address]  Fax: 1   Jonelle Sidle, M.D. [facility]

## 2011-06-16 ENCOUNTER — Telehealth: Payer: Self-pay | Admitting: Cardiology

## 2011-06-16 NOTE — Telephone Encounter (Signed)
11/26/10 & 03/25/11  mailed reminder to schedule f/u 

## 2011-10-09 ENCOUNTER — Encounter: Payer: Self-pay | Admitting: *Deleted

## 2011-10-16 ENCOUNTER — Ambulatory Visit (INDEPENDENT_AMBULATORY_CARE_PROVIDER_SITE_OTHER): Payer: Medicare Other | Admitting: Cardiology

## 2011-10-16 ENCOUNTER — Encounter: Payer: Self-pay | Admitting: Cardiology

## 2011-10-16 VITALS — BP 115/73 | HR 92 | Resp 16 | Ht 65.0 in | Wt 192.0 lb

## 2011-10-16 DIAGNOSIS — R634 Abnormal weight loss: Secondary | ICD-10-CM | POA: Insufficient documentation

## 2011-10-16 DIAGNOSIS — I2510 Atherosclerotic heart disease of native coronary artery without angina pectoris: Secondary | ICD-10-CM

## 2011-10-16 DIAGNOSIS — F17200 Nicotine dependence, unspecified, uncomplicated: Secondary | ICD-10-CM

## 2011-10-16 DIAGNOSIS — E7800 Pure hypercholesterolemia, unspecified: Secondary | ICD-10-CM

## 2011-10-16 DIAGNOSIS — I714 Abdominal aortic aneurysm, without rupture: Secondary | ICD-10-CM

## 2011-10-16 NOTE — Assessment & Plan Note (Signed)
Patient currently smokes only 2 cigarettes a week 1 cigarette on Monday and one cigarette on Friday.

## 2011-10-16 NOTE — Patient Instructions (Signed)
Continue all current medications. Your physician wants you to follow up in:  1 year.  You will receive a reminder letter in the mail one-two months in advance.  If you don't receive a letter, please call our office to schedule the follow up appointment   

## 2011-10-16 NOTE — Assessment & Plan Note (Signed)
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 We will follow this in the future but the patient currently does not need repeat ultrasound.

## 2011-10-16 NOTE — Assessment & Plan Note (Signed)
Blood work is done by the patient's primary care physician

## 2011-10-16 NOTE — Assessment & Plan Note (Signed)
Patient had a 20-25 pound weight loss in the last several months. It appears that he had an EGD and colonoscopy done as well as a chest x-ray. Nevertheless he used to smoke 3 packs a day and quit approximately 5 years ago. I asked him to further discuss this with his primary care physician to rule out occult malignancy. I told the patient that this is not related to his cardiac condition. He has no symptoms of low output state or evidence of LV dysfunction or any signs of could point towards cardiac cachexia. I do not think a 2-D echocardiogram is necessary at this point.

## 2011-10-16 NOTE — Progress Notes (Signed)
Miguel Bottoms, MD, [facility] Board Certified in Adult Cardiovascular Medicine,Internal Medicine and Critical Care Medicine    CC: Followup patient with history of coronary disease and bypass grafting  HPI:  The patient is doing quite well. 2 years ago had a stress test which showed no ischemia. Ejection fraction is normal. He denies any chest pain shortness of breath orthopnea PND. He reports no palpitations presyncope or syncope. He has blood work on a regular basis and is followed by his primary care physician including his dyslipidemia. Of note is that the patient reports significant weight loss over the last several months of approximately 20-25 pounds. He states that his appetite is still good. Otherwise from a cardiovascular standpoint is stable.  PMH: reviewed and listed in Problem List in Electronic Records (and see below) Past Medical History  Diagnosis Date  . CAD (coronary artery disease)     multivessel  . Myocardial infarction     x's 2  . Stroke 1997  . Type 2 diabetes mellitus   . Unspecified essential hypertension   . Dyslipidemia   . Migraine headache    Past Surgical History  Procedure Date  . Coronary artery bypass graft 2003    ([facility]) LIMA-LAD,SVG diagonal and SVG-CFX  . Carotid endarterectomy     LEFT  . Back surgery     MULTIPLE LOWER BACK SURGERIES  . Anterior cervical decomp/discectomy fusion   . Cardiac catheterization     Allergies/SH/FHX : available in Electronic Records for review  Allergies  Allergen Reactions  . Ibuprofen     REACTION: hives   History   Social History  . Marital Status: Divorced    Spouse Name: N/A    Number of Children: N/A  . Years of Education: N/A   Occupational History  . Not on file.   Social History Main Topics  . Smoking status: Current Everyday Smoker -- 0.3 packs/day    Types: Cigarettes  . Smokeless tobacco: Not on file  . Alcohol Use: Not on file  . Drug Use: Not on file  . Sexually Active: Not on  file   Other Topics Concern  . Not on file   Social History Narrative  . No narrative on file   Family History  Problem Relation Age of Onset  . Heart attack Father   . Heart attack Mother   . Heart attack Brother     Medications: Current Outpatient Prescriptions  Medication Sig Dispense Refill  . ALPRAZolam (XANAX) 1 MG tablet Take 1 mg by mouth 2 (two) times daily.      Marland Kitchen amLODipine (NORVASC) 10 MG tablet Take 10 mg by mouth daily.      Marland Kitchen aspirin EC 81 MG tablet Take 81 mg by mouth daily.      Marland Kitchen atorvastatin (LIPITOR) 20 MG tablet Take 20 mg by mouth daily.      . clopidogrel (PLAVIX) 75 MG tablet Take 75 mg by mouth daily.      . cyclobenzaprine (FLEXERIL) 10 MG tablet Take 10 mg by mouth 3 (three) times daily as needed.      Marland Kitchen esomeprazole (NEXIUM) 40 MG capsule Take 40 mg by mouth daily before breakfast.      . glimepiride (AMARYL) 2 MG tablet Take 2 mg by mouth 2 (two) times daily.      . hydrochlorothiazide (HYDRODIURIL) 25 MG tablet Take 25 mg by mouth daily.      . metFORMIN (GLUCOPHAGE) 500 MG tablet Take 1,000  mg by mouth 2 (two) times daily with a meal.      . metoprolol (LOPRESSOR) 100 MG tablet Take 100 mg by mouth every morning. & 1/2 by mouth in evening      . nitroGLYCERIN (NITROSTAT) 0.4 MG SL tablet Place 0.4 mg under the tongue every 5 (five) minutes as needed.      . vitamin C (ASCORBIC ACID) 500 MG tablet Take 500 mg by mouth daily.        ROS: No nausea or vomiting. No fever or chills.No melena or hematochezia.No bleeding.No claudication  Physical Exam: BP 115/73  Pulse 92  Resp 16  Ht 5\' 5"  (1.651 m)  Wt 192 lb (87.091 kg)  BMI 31.95 kg/m2 General: Well-nourished white male in no distress overweight Neck: Normal carotid upstroke. No carotid bruits. Left neck scar from prior anterior laminectomy. No thyromegaly nonnodular thyroid. JVP is 6 cm  lungs: with clear breath sounds bilaterally no wheezing Cardiac:Regular rate and rhythm with normal S1-S2  no murmur rubs or gallops  Vascular:No edema. Normal distal pulses bilaterally  Skin:Warm and dry  Physcologic:Normal affect  12lead ECG: Normal sinus rhythm without any acute ischemic changes or prior infarct patterns Limited bedside ECHO:N/A No images are attached to the encounter.   Assessment and Plan  CORONARY ATHEROSCLEROSIS NATIVE CORONARY ARTERY Patient is status post coronary bypass grafting. He has no recurrent chest pain. 2 years ago he had a Cardiolite stress study which was within normal limits. The patient continues to do well with no chest pain. No further ischemia workup is needed.  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 We will follow this in the future but the patient currently does not need repeat ultrasound.  Weight loss, abnormal Patient had a 20-25 pound weight loss in the last several months. It appears that he had an EGD and colonoscopy done as well as a chest x-ray. Nevertheless he used to smoke 3 packs a day and quit approximately 5 years ago. I asked him to further discuss this with his primary care physician to rule out occult malignancy. I told the patient that this is not related to his cardiac condition. He has no symptoms of low output state or evidence of LV dysfunction or any signs of could point towards cardiac cachexia. I do not think a 2-D echocardiogram is necessary at this point.  TOBACCO USER Patient currently smokes only 2 cigarettes a week 1 cigarette on Monday and one cigarette on Friday.  PURE HYPERCHOLESTEROLEMIA Blood work is done by the patient's primary care physician   Patient Active Problem List  Diagnosis  . DM  . PURE HYPERCHOLESTEROLEMIA  . TOBACCO USER  . MIGRAINE UNSP W/O INTRACT W/O STATUS MIGRAINOSUS  . HYPERTENSION  . CORONARY ATHEROSCLEROSIS NATIVE CORONARY ARTERY  . VENTRICULAR FIBRILLATION  . AAA  . SYNCOPE  . CHEST PAIN UNSPECIFIED  . PERSONAL HX TIA & CI W/O  RESIDUAL DEFICITS  . POSTSURGICAL AORTOCORONARY BYPASS STATUS  . Weight loss, abnormal

## 2011-10-16 NOTE — Assessment & Plan Note (Signed)
Patient is status post coronary bypass grafting. He has no recurrent chest pain. 2 years ago he had a Cardiolite stress study which was within normal limits. The patient continues to do well with no chest pain. No further ischemia workup is needed.

## 2012-11-13 ENCOUNTER — Encounter: Payer: Self-pay | Admitting: Physician Assistant

## 2012-11-13 DIAGNOSIS — R079 Chest pain, unspecified: Secondary | ICD-10-CM

## 2012-11-14 ENCOUNTER — Encounter: Payer: Self-pay | Admitting: Physician Assistant

## 2012-11-14 DIAGNOSIS — I200 Unstable angina: Secondary | ICD-10-CM

## 2012-11-14 DIAGNOSIS — I219 Acute myocardial infarction, unspecified: Secondary | ICD-10-CM

## 2012-12-09 ENCOUNTER — Encounter: Payer: Medicare Other | Admitting: Physician Assistant

## 2013-01-13 ENCOUNTER — Encounter: Payer: Self-pay | Admitting: Cardiovascular Disease

## 2013-02-06 ENCOUNTER — Encounter: Payer: Self-pay | Admitting: Cardiovascular Disease

## 2013-05-19 ENCOUNTER — Other Ambulatory Visit: Payer: Self-pay | Admitting: Physician Assistant

## 2013-07-11 ENCOUNTER — Other Ambulatory Visit: Payer: Self-pay | Admitting: Physician Assistant

## 2015-02-11 ENCOUNTER — Encounter: Payer: Self-pay | Admitting: *Deleted

## 2015-02-25 ENCOUNTER — Ambulatory Visit (INDEPENDENT_AMBULATORY_CARE_PROVIDER_SITE_OTHER): Payer: Self-pay | Admitting: Cardiology

## 2015-02-25 ENCOUNTER — Encounter: Payer: Self-pay | Admitting: Cardiology

## 2015-02-25 VITALS — BP 130/63 | HR 99 | Ht 65.0 in | Wt 200.4 lb

## 2015-02-25 DIAGNOSIS — I10 Essential (primary) hypertension: Secondary | ICD-10-CM

## 2015-02-25 DIAGNOSIS — I5189 Other ill-defined heart diseases: Secondary | ICD-10-CM

## 2015-02-25 DIAGNOSIS — I2510 Atherosclerotic heart disease of native coronary artery without angina pectoris: Secondary | ICD-10-CM

## 2015-02-25 DIAGNOSIS — I5032 Chronic diastolic (congestive) heart failure: Secondary | ICD-10-CM

## 2015-02-25 DIAGNOSIS — I519 Heart disease, unspecified: Secondary | ICD-10-CM

## 2015-02-25 MED ORDER — FUROSEMIDE 40 MG PO TABS: 40.0000 mg | ORAL_TABLET | Freq: Every day | ORAL | Status: DC

## 2015-02-25 NOTE — Patient Instructions (Signed)
Your physician has recommended you make the following change in your medication: Increase your furosemide to 40 mg daily. You may take (2) of your 20 mg tablets daily until they are finished. Continue all other medications the same. Your physician recommends that you return for lab work in: 2 weeks around 03/11/15 to check your BMET. Your physician recommends that you schedule a follow-up appointment in: 6 months. You will receive a reminder letter in the mail in about 4 months reminding you to call and schedule your appointment. If you don't receive this letter, please contact our office.

## 2015-02-25 NOTE — Progress Notes (Signed)
Cardiology Office Note  Date: 02/25/2015   ID: Miguel GriceMarvin C Culp, DOB 09-29-1940, MRN [id number]  PCP: [name],ASHISH, MD  Consulting Cardiologist: Nona DellSamuel Shantil Vallejo, MD   Chief Complaint  Patient presents with  . Reestablish cardiac follow-up  . Coronary Artery Disease    History of Present Illness: Miguel Mendoza is a 74 y.o. male former patient of Dr. Andee LinemaneGent, not seen since 2013. This is our first meeting in the office. I reviewed records and updated the chart. [facility] with shortness of breath and reported mental status changes. He was treated by Dr. Sherryll BurgerShah for suspected pneumonia and acute on chronic diastolic heart failure. Chest CT imaging did not demonstrate pulmonary embolus, there was mild interstitial edema with bilateral pleural effusions that were of moderate size, also small degree of airspace consolidation at the posterior right base. Echocardiogram is noted below.  He is here today with his wife for a follow-up visit. Overall history is somewhat vague. He does not recall the specifics of his hospital stay, and it does sound like he had some confusion or memory deficits at presentation. He does recall having cough and shortness of breath, more than anything else complains of a longer standing problem with bilateral leg pain at least over the last 4 or 5 months. He does not endorse any angina symptoms or palpitations.  I reviewed his medications. He states that he has been on low-dose Lasix for "a while now." No other major changes to his cardiac regimen. He does not weigh himself. He doesn't endorse problems with chronic leg edema.  Appetite has been stable. He reports now using oxygen intermittently at home. Has a cane to assist with ambulation. Denies any recent falls.  Ischemic testing from 2014 is reviewed below. I reviewed his recent echocardiogram report as well as ECG.   Past Medical History  Diagnosis Date  . CAD (coronary  artery disease)     Multivessel status post stent to LAD 1998 then CABG 2003  . Myocardial infarction (HCC)   . History of stroke 741997  . Type 2 diabetes mellitus (HCC)   . Essential hypertension   . Dyslipidemia   . Migraine headache   . GERD (gastroesophageal reflux disease)   . Anxiety   . Cervical disc disease     Past Surgical History  Procedure Laterality Date  . Coronary artery bypass graft  2003    Dr. Cornelius Moraswen - LIMA to LAD, SVG to OM, SVG to RCA  . Carotid endarterectomy Left   . Back surgery    . Anterior cervical decomp/discectomy fusion      Current Outpatient Prescriptions  Medication Sig Dispense Refill  . amLODipine (NORVASC) 10 MG tablet Take 10 mg by mouth daily.    Marland Kitchen. atorvastatin (LIPITOR) 20 MG tablet Take 20 mg by mouth daily.    . clonazePAM (KLONOPIN) 1 MG tablet Take 1 mg by mouth 2 (two) times daily.    . clopidogrel (PLAVIX) 75 MG tablet Take 75 mg by mouth daily.    Marland Kitchen. esomeprazole (NEXIUM) 40 MG capsule Take 40 mg by mouth daily before breakfast.    . gabapentin (NEURONTIN) 300 MG capsule Take 300 mg by mouth 3 (three) times daily.    Marland Kitchen. glimepiride (AMARYL) 2 MG tablet Take 2 mg by mouth daily.     . hydrochlorothiazide (HYDRODIURIL) 25 MG tablet Take 25 mg by mouth daily.    . isosorbide mononitrate (IMDUR) 30 MG 24  hr tablet Take 30 mg by mouth daily.    Marland Kitchen lisinopril (PRINIVIL,ZESTRIL) 2.5 MG tablet Take 2.5 mg by mouth daily.    . metFORMIN (GLUCOPHAGE) 500 MG tablet Take 1,000 mg by mouth 2 (two) times daily with a meal.    . metoprolol (LOPRESSOR) 100 MG tablet Take 100 mg by mouth every morning. & 1/2 by mouth in evening    . nitroGLYCERIN (NITROSTAT) 0.4 MG SL tablet Place 0.4 mg under the tongue every 5 (five) minutes as needed.    Marland Kitchen oxyCODONE-acetaminophen (PERCOCET) 7.5-325 MG tablet Take 1 tablet by mouth every 4 (four) hours as needed for severe pain (4-6 hours).    Marland Kitchen tiZANidine (ZANAFLEX) 4 MG tablet Take 4 mg by mouth 2 (two) times daily as  needed for muscle spasms.    . traZODone (DESYREL) 100 MG tablet Take 100 mg by mouth at bedtime.    . furosemide (LASIX) 40 MG tablet Take 1 tablet (40 mg total) by mouth daily. 90 tablet 3   No current facility-administered medications for this visit.    Allergies:  Ibuprofen   Social History: The patient  reports that he has been smoking Cigarettes.  He has been smoking about 0.50 packs per day. He does not have any smokeless tobacco history on file. He reports that he does not drink alcohol or use illicit drugs.   Family History: Family History  Problem Relation Age of Onset  . Heart attack Father   . Heart attack Mother   . Heart attack Brother    ROS:  Please see the history of present illness. Otherwise, complete review of systems is positive for decreased hearing, chronic bilateral leg pain.  All other systems are reviewed and negative.   Physical Exam: VS:  BP 130/63 mmHg  Pulse 99  Ht  (1.651 m)  Wt 200 lb 6.4 oz (90.901 kg)  BMI 33.35 kg/m2, BMI Body mass index is 33.35 kg/(m^2).  Wt Readings from Last 3 Encounters:  02/25/15 200 lb 6.4 oz (90.901 kg)  10/16/11 192 lb (87.091 kg)  01/21/10 201 lb (91.173 kg)     General: Obese male, appears comfortable at rest. HEENT: Conjunctiva and lids normal, oropharynx clear. Neck: Supple, no elevated JVP or carotid bruits, no thyromegaly. Lungs: Diminished breath sounds at the bases, nonlabored breathing at rest. Cardiac: Regular rate and rhythm, no S3 2/6 systolic murmur, no pericardial rub. Abdomen: Protuberant, nontender, bowel sounds present, no guarding or rebound. Extremities: Chronic appearing 2+ leg edema, distal pulses 2+. Skin: Warm and dry. Musculoskeletal: No kyphosis. Neuropsychiatric: Alert and oriented x3, affect grossly appropriate.   ECG: Tracing from 02/07/2015 showed sinus rhythm with low voltage and nonspecific T-wave changes.  Recent Labwork:  October 2016: BUN 17, creatinine 0.8, potassium  4.0, peak NT-proBNP 1372, hemoglobin 11.7, platelets 156, blood cultures negative.  Other Studies Reviewed Today:  Lexiscan Cardiolite 11/14/2012 [facility]) revealed nondiagnostic ST segment changes, moderate sized septal scar as well as a region of scar within the inferior wall associated with minimal peri-infarct ischemia, LVEF 56%.  Echocardiogram 02/08/2015 [facility]) reported mild LVH with LVEF 55-60%, mild to moderate left atrial enlargement, mild to moderate right atrial enlargement, sclerotic aortic valve without stenosis, suggestion of increased RV pressure and volume however RVSP was not able to be calculated.   Assessment and Plan:  1. Recent evaluation noted for suspected acute on chronic diastolic heart failure and pneumonia. Echocardiogram report indicates moderate RV dilatation and evidence of increased pressure and volume overload,  he probably has a component of RV dysfunction as well as which contributed to his leg edema. Lasix will be increased to 40 mg daily, follow-up BMET in a few weeks. Will consider potassium supplement.  2. Ischemic heart disease status post CABG in 2003 as outlined above. No active angina symptoms. Ischemic testing from 2014 is reviewed above. We will continue medical therapy and observation for now.  3. Essential hypertension, blood pressure control is reasonable today.  4. Type 2 diabetes mellitus, followed by Dr. Sherryll Burger.  5. Hyperlipidemia, on Lipitor.  Current medicines were reviewed with the patient today.   Orders Placed This Encounter  Procedures  . Basic metabolic panel    Disposition: FU with me in 6 months.   Signed, Jonelle Sidle, MD, [facility] 02/25/2015 9:58 AM    [facility] at [facility] [address] [place], [place], [place] [address] Phone: [phone]; Fax: [phone]

## 2015-02-26 ENCOUNTER — Telehealth: Payer: Self-pay | Admitting: Cardiology

## 2015-02-26 NOTE — Telephone Encounter (Signed)
Would like to ask questions about paperwork for hospital

## 2015-02-26 NOTE — Telephone Encounter (Signed)
Patient needed clarification on what to do with the paperwork that was handed to him after his visit on yesterday. Nurse advised patient that the lab work order is to take with him to the hospital when he goes for his lab work and the instruction sheet is for him to keep on hand. Patient verbalized understanding of plan.

## 2015-03-14 ENCOUNTER — Other Ambulatory Visit: Payer: Self-pay | Admitting: *Deleted

## 2015-03-14 ENCOUNTER — Telehealth: Payer: Self-pay | Admitting: *Deleted

## 2015-03-14 DIAGNOSIS — I10 Essential (primary) hypertension: Secondary | ICD-10-CM

## 2015-03-14 MED ORDER — FUROSEMIDE 40 MG PO TABS
20.0000 mg | ORAL_TABLET | ORAL | Status: DC
Start: 2015-03-14 — End: 2015-04-27

## 2015-03-14 NOTE — Telephone Encounter (Signed)
-----   Message from Jonelle SidleSamuel G McDowell, MD sent at 03/12/2015  4:58 PM EST ----- Reviewed report. Creatinine has bumped up to 1.5, potassium normal at 4.8. We increased his Lasix at the recent office visit. Would have him take Lasix 20 mg alternating with 40 mg every other day, stop HCTZ. Follow-up BMET in the next 2 weeks.

## 2015-03-14 NOTE — Telephone Encounter (Signed)
Patient informed and verbalized understanding of plan. Lab work order faxed to [facility] lab. 

## 2015-03-14 NOTE — Addendum Note (Signed)
Addended by: Eustace MooreANDERSON, LYDIA M on: 03/14/2015 09:54 AM   Modules accepted: Orders

## 2015-03-29 ENCOUNTER — Telehealth: Payer: Self-pay | Admitting: *Deleted

## 2015-03-29 NOTE — Telephone Encounter (Signed)
-----   Message from Jonelle SidleSamuel G McDowell, MD sent at 03/29/2015 11:21 AM EST ----- Reviewed. Creatinine has come down from 1.5-1.2, potassium normal. Continue current regimen.

## 2015-03-29 NOTE — Telephone Encounter (Signed)
Patient informed. 

## 2015-04-11 ENCOUNTER — Inpatient Hospital Stay (HOSPITAL_COMMUNITY): Payer: Medicare HMO

## 2015-04-11 ENCOUNTER — Other Ambulatory Visit: Payer: Self-pay

## 2015-04-11 ENCOUNTER — Emergency Department (HOSPITAL_COMMUNITY): Payer: Medicare HMO

## 2015-04-11 ENCOUNTER — Inpatient Hospital Stay (HOSPITAL_COMMUNITY)
Admission: EM | Admit: 2015-04-11 | Discharge: 2015-04-26 | DRG: 207 | Disposition: A | Discharge status: Skilled Nursing Facility | Payer: Medicare HMO | Attending: Internal Medicine | Admitting: Internal Medicine

## 2015-04-11 ENCOUNTER — Encounter (HOSPITAL_COMMUNITY): Payer: Self-pay | Admitting: Emergency Medicine

## 2015-04-11 DIAGNOSIS — J9622 Acute and chronic respiratory failure with hypercapnia: Secondary | ICD-10-CM | POA: Diagnosis not present

## 2015-04-11 DIAGNOSIS — R001 Bradycardia, unspecified: Secondary | ICD-10-CM | POA: Diagnosis not present

## 2015-04-11 DIAGNOSIS — Z4659 Encounter for fitting and adjustment of other gastrointestinal appliance and device: Secondary | ICD-10-CM

## 2015-04-11 DIAGNOSIS — I2510 Atherosclerotic heart disease of native coronary artery without angina pectoris: Secondary | ICD-10-CM | POA: Diagnosis not present

## 2015-04-11 DIAGNOSIS — E785 Hyperlipidemia, unspecified: Secondary | ICD-10-CM | POA: Diagnosis present

## 2015-04-11 DIAGNOSIS — J439 Emphysema, unspecified: Secondary | ICD-10-CM | POA: Diagnosis not present

## 2015-04-11 DIAGNOSIS — J9600 Acute respiratory failure, unspecified whether with hypoxia or hypercapnia: Secondary | ICD-10-CM | POA: Diagnosis not present

## 2015-04-11 DIAGNOSIS — Z951 Presence of aortocoronary bypass graft: Secondary | ICD-10-CM | POA: Diagnosis not present

## 2015-04-11 DIAGNOSIS — J9601 Acute respiratory failure with hypoxia: Secondary | ICD-10-CM | POA: Diagnosis present

## 2015-04-11 DIAGNOSIS — N183 Chronic kidney disease, stage 3 (moderate): Secondary | ICD-10-CM | POA: Diagnosis present

## 2015-04-11 DIAGNOSIS — I248 Other forms of acute ischemic heart disease: Secondary | ICD-10-CM | POA: Diagnosis present

## 2015-04-11 DIAGNOSIS — I252 Old myocardial infarction: Secondary | ICD-10-CM

## 2015-04-11 DIAGNOSIS — Z66 Do not resuscitate: Secondary | ICD-10-CM | POA: Diagnosis not present

## 2015-04-11 DIAGNOSIS — F17210 Nicotine dependence, cigarettes, uncomplicated: Secondary | ICD-10-CM | POA: Diagnosis present

## 2015-04-11 DIAGNOSIS — R0600 Dyspnea, unspecified: Secondary | ICD-10-CM | POA: Diagnosis not present

## 2015-04-11 DIAGNOSIS — Z7902 Long term (current) use of antithrombotics/antiplatelets: Secondary | ICD-10-CM

## 2015-04-11 DIAGNOSIS — N19 Unspecified kidney failure: Secondary | ICD-10-CM | POA: Insufficient documentation

## 2015-04-11 DIAGNOSIS — K219 Gastro-esophageal reflux disease without esophagitis: Secondary | ICD-10-CM | POA: Diagnosis present

## 2015-04-11 DIAGNOSIS — E119 Type 2 diabetes mellitus without complications: Secondary | ICD-10-CM

## 2015-04-11 DIAGNOSIS — I130 Hypertensive heart and chronic kidney disease with heart failure and stage 1 through stage 4 chronic kidney disease, or unspecified chronic kidney disease: Secondary | ICD-10-CM | POA: Diagnosis present

## 2015-04-11 DIAGNOSIS — I2781 Cor pulmonale (chronic): Secondary | ICD-10-CM | POA: Diagnosis present

## 2015-04-11 DIAGNOSIS — N170 Acute kidney failure with tubular necrosis: Secondary | ICD-10-CM | POA: Diagnosis present

## 2015-04-11 DIAGNOSIS — J948 Other specified pleural conditions: Secondary | ICD-10-CM

## 2015-04-11 DIAGNOSIS — R4182 Altered mental status, unspecified: Secondary | ICD-10-CM | POA: Diagnosis present

## 2015-04-11 DIAGNOSIS — Z955 Presence of coronary angioplasty implant and graft: Secondary | ICD-10-CM

## 2015-04-11 DIAGNOSIS — J440 Chronic obstructive pulmonary disease with acute lower respiratory infection: Secondary | ICD-10-CM | POA: Diagnosis present

## 2015-04-11 DIAGNOSIS — J441 Chronic obstructive pulmonary disease with (acute) exacerbation: Secondary | ICD-10-CM | POA: Diagnosis not present

## 2015-04-11 DIAGNOSIS — J90 Pleural effusion, not elsewhere classified: Secondary | ICD-10-CM | POA: Diagnosis present

## 2015-04-11 DIAGNOSIS — I4891 Unspecified atrial fibrillation: Secondary | ICD-10-CM | POA: Diagnosis not present

## 2015-04-11 DIAGNOSIS — Z9911 Dependence on respirator [ventilator] status: Secondary | ICD-10-CM

## 2015-04-11 DIAGNOSIS — F17200 Nicotine dependence, unspecified, uncomplicated: Secondary | ICD-10-CM | POA: Diagnosis present

## 2015-04-11 DIAGNOSIS — E860 Dehydration: Secondary | ICD-10-CM | POA: Diagnosis not present

## 2015-04-11 DIAGNOSIS — J189 Pneumonia, unspecified organism: Secondary | ICD-10-CM | POA: Diagnosis present

## 2015-04-11 DIAGNOSIS — Z7984 Long term (current) use of oral hypoglycemic drugs: Secondary | ICD-10-CM

## 2015-04-11 DIAGNOSIS — I25810 Atherosclerosis of coronary artery bypass graft(s) without angina pectoris: Secondary | ICD-10-CM | POA: Insufficient documentation

## 2015-04-11 DIAGNOSIS — Z9889 Other specified postprocedural states: Secondary | ICD-10-CM

## 2015-04-11 DIAGNOSIS — E876 Hypokalemia: Secondary | ICD-10-CM | POA: Diagnosis not present

## 2015-04-11 DIAGNOSIS — I10 Essential (primary) hypertension: Secondary | ICD-10-CM

## 2015-04-11 DIAGNOSIS — E1122 Type 2 diabetes mellitus with diabetic chronic kidney disease: Secondary | ICD-10-CM | POA: Diagnosis present

## 2015-04-11 DIAGNOSIS — J9602 Acute respiratory failure with hypercapnia: Secondary | ICD-10-CM | POA: Diagnosis not present

## 2015-04-11 DIAGNOSIS — J9690 Respiratory failure, unspecified, unspecified whether with hypoxia or hypercapnia: Secondary | ICD-10-CM

## 2015-04-11 DIAGNOSIS — R0902 Hypoxemia: Secondary | ICD-10-CM

## 2015-04-11 DIAGNOSIS — I5033 Acute on chronic diastolic (congestive) heart failure: Secondary | ICD-10-CM | POA: Diagnosis present

## 2015-04-11 DIAGNOSIS — Z95828 Presence of other vascular implants and grafts: Secondary | ICD-10-CM

## 2015-04-11 DIAGNOSIS — Z8673 Personal history of transient ischemic attack (TIA), and cerebral infarction without residual deficits: Secondary | ICD-10-CM | POA: Diagnosis not present

## 2015-04-11 DIAGNOSIS — E870 Hyperosmolality and hypernatremia: Secondary | ICD-10-CM | POA: Diagnosis not present

## 2015-04-11 DIAGNOSIS — Z978 Presence of other specified devices: Secondary | ICD-10-CM

## 2015-04-11 DIAGNOSIS — R000 Tachycardia, unspecified: Secondary | ICD-10-CM | POA: Insufficient documentation

## 2015-04-11 DIAGNOSIS — G9340 Encephalopathy, unspecified: Secondary | ICD-10-CM | POA: Diagnosis not present

## 2015-04-11 LAB — CBC WITH DIFFERENTIAL/PLATELET
BASOS ABS: 0 10*3/uL (ref 0.0–0.1)
Basophils Relative: 1 %
Eosinophils Absolute: 0.1 10*3/uL (ref 0.0–0.7)
Eosinophils Relative: 1 %
HEMATOCRIT: 47.3 % (ref 39.0–52.0)
Hemoglobin: 12.8 g/dL — ABNORMAL LOW (ref 13.0–17.0)
LYMPHS ABS: 1.1 10*3/uL (ref 0.7–4.0)
LYMPHS PCT: 17 %
MCH: 25.8 pg — ABNORMAL LOW (ref 26.0–34.0)
MCHC: 27.1 g/dL — AB (ref 30.0–36.0)
MCV: 95.2 fL (ref 78.0–100.0)
Monocytes Absolute: 0.6 10*3/uL (ref 0.1–1.0)
Monocytes Relative: 10 %
NEUTROS ABS: 4.7 10*3/uL (ref 1.7–7.7)
NEUTROS PCT: 71 %
PLATELETS: 192 10*3/uL (ref 150–400)
RBC: 4.97 MIL/uL (ref 4.22–5.81)
RDW: 19.3 % — AB (ref 11.5–15.5)
WBC: 6.5 10*3/uL (ref 4.0–10.5)

## 2015-04-11 LAB — BLOOD GAS, ARTERIAL
Acid-Base Excess: 5.3 mmol/L — ABNORMAL HIGH (ref 0.0–2.0)
Acid-Base Excess: 6.9 mmol/L — ABNORMAL HIGH (ref 0.0–2.0)
Bicarbonate: 35 mEq/L — ABNORMAL HIGH (ref 20.0–24.0)
Bicarbonate: 28 mEq/L — ABNORMAL HIGH (ref 20.0–24.0)
DRAWN BY: 23534
Delivery systems: POSITIVE
Drawn by: 382351
Expiratory PAP: 7
FIO2: 1
FIO2: 0.75
INSPIRATORY PAP: 14
LHR: 12 {breaths}/min
O2 SAT: 91.7 %
O2 Saturation: 96.5 %
PATIENT TEMPERATURE: 37
PH ART: 7.237 — AB (ref 7.350–7.450)
Patient temperature: 37
pCO2 arterial: 85.1 mmHg (ref 35.0–45.0)
pCO2 arterial: 90.3 mmHg (ref 35.0–45.0)
pH, Arterial: 7.208 — ABNORMAL LOW (ref 7.350–7.450)
pO2, Arterial: 108 mmHg — ABNORMAL HIGH (ref 80.0–100.0)
pO2, Arterial: 79.7 mmHg — ABNORMAL LOW (ref 80.0–100.0)

## 2015-04-11 LAB — COMPREHENSIVE METABOLIC PANEL
ALBUMIN: 4 g/dL (ref 3.5–5.0)
ALT: 8 U/L — ABNORMAL LOW (ref 17–63)
ANION GAP: 9 (ref 5–15)
AST: 15 U/L (ref 15–41)
Alkaline Phosphatase: 60 U/L (ref 38–126)
BILIRUBIN TOTAL: 1.1 mg/dL (ref 0.3–1.2)
BUN: 20 mg/dL (ref 6–20)
CO2: 35 mmol/L — ABNORMAL HIGH (ref 22–32)
Calcium: 9.5 mg/dL (ref 8.9–10.3)
Chloride: 99 mmol/L — ABNORMAL LOW (ref 101–111)
Creatinine, Ser: 1.35 mg/dL — ABNORMAL HIGH (ref 0.61–1.24)
GFR calc Af Amer: 59 mL/min — ABNORMAL LOW (ref >60)
GFR calc non Af Amer: 50 mL/min — ABNORMAL LOW (ref >60)
GLUCOSE: 114 mg/dL — AB (ref 65–99)
POTASSIUM: 4.6 mmol/L (ref 3.5–5.1)
Sodium: 143 mmol/L (ref 135–145)
TOTAL PROTEIN: 7.9 g/dL (ref 6.5–8.1)

## 2015-04-11 LAB — CBG MONITORING, ED: Glucose-Capillary: 112 mg/dL — ABNORMAL HIGH (ref 65–99)

## 2015-04-11 LAB — BRAIN NATRIURETIC PEPTIDE: B NATRIURETIC PEPTIDE 5: 422 pg/mL — AB (ref 0.0–100.0)

## 2015-04-11 LAB — TROPONIN I: TROPONIN I: 0.05 ng/mL — AB (ref <0.031)

## 2015-04-11 LAB — TSH: TSH: 1.082 u[IU]/mL (ref 0.350–4.500)

## 2015-04-11 LAB — LACTIC ACID, PLASMA: LACTIC ACID, VENOUS: 1.3 mmol/L (ref 0.5–2.0)

## 2015-04-11 MED ORDER — GABAPENTIN 300 MG PO CAPS
300.0000 mg | ORAL_CAPSULE | Freq: Three times a day (TID) | ORAL | Status: DC
  Administered 2015-04-12 (×2): 300 mg via ORAL
  Filled 2015-04-11 (×2): qty 1

## 2015-04-11 MED ORDER — INSULIN ASPART 100 UNIT/ML ~~LOC~~ SOLN: 0.0000 [IU] | Freq: Every day | SUBCUTANEOUS | Status: DC

## 2015-04-11 MED ORDER — METOPROLOL TARTRATE 50 MG PO TABS: 100.0000 mg | ORAL_TABLET | Freq: Every morning | ORAL | Status: DC

## 2015-04-11 MED ORDER — INSULIN ASPART 100 UNIT/ML ~~LOC~~ SOLN
0.0000 [IU] | Freq: Three times a day (TID) | SUBCUTANEOUS | Status: DC
  Administered 2015-04-12: 2 [IU] via SUBCUTANEOUS
  Administered 2015-04-12: 3 [IU] via SUBCUTANEOUS

## 2015-04-11 MED ORDER — ATORVASTATIN CALCIUM 20 MG PO TABS
20.0000 mg | ORAL_TABLET | Freq: Every day | ORAL | Status: DC
  Administered 2015-04-12: 20 mg via ORAL
  Filled 2015-04-11 (×3): qty 1

## 2015-04-11 MED ORDER — ONDANSETRON HCL 4 MG/2ML IJ SOLN
4.0000 mg | Freq: Four times a day (QID) | INTRAMUSCULAR | Status: DC | PRN
  Administered 2015-04-26: 4 mg via INTRAVENOUS
  Filled 2015-04-11: qty 2

## 2015-04-11 MED ORDER — IPRATROPIUM-ALBUTEROL 0.5-2.5 (3) MG/3ML IN SOLN
3.0000 mL | RESPIRATORY_TRACT | Status: DC
  Administered 2015-04-11 – 2015-04-13 (×10): 3 mL via RESPIRATORY_TRACT
  Filled 2015-04-11 (×10): qty 3

## 2015-04-11 MED ORDER — OXYCODONE-ACETAMINOPHEN 7.5-325 MG PO TABS: 1.0000 | ORAL_TABLET | ORAL | Status: DC | PRN

## 2015-04-11 MED ORDER — GLIMEPIRIDE 2 MG PO TABS
2.0000 mg | ORAL_TABLET | Freq: Every day | ORAL | Status: DC
  Administered 2015-04-12: 2 mg via ORAL
  Filled 2015-04-11 (×3): qty 1

## 2015-04-11 MED ORDER — ENOXAPARIN SODIUM 40 MG/0.4ML ~~LOC~~ SOLN
40.0000 mg | SUBCUTANEOUS | Status: DC
  Administered 2015-04-12 – 2015-04-17 (×7): 40 mg via SUBCUTANEOUS
  Filled 2015-04-11 (×7): qty 0.4

## 2015-04-11 MED ORDER — LISINOPRIL 5 MG PO TABS
2.5000 mg | ORAL_TABLET | Freq: Every day | ORAL | Status: DC
  Administered 2015-04-12: 2.5 mg via ORAL
  Filled 2015-04-11: qty 1

## 2015-04-11 MED ORDER — METFORMIN HCL 500 MG PO TABS
1000.0000 mg | ORAL_TABLET | Freq: Two times a day (BID) | ORAL | Status: DC
  Administered 2015-04-12: 1000 mg via ORAL
  Filled 2015-04-11: qty 2

## 2015-04-11 MED ORDER — TIZANIDINE HCL 4 MG PO TABS
4.0000 mg | ORAL_TABLET | Freq: Two times a day (BID) | ORAL | Status: DC
  Administered 2015-04-12: 4 mg via ORAL
  Filled 2015-04-11 (×8): qty 1

## 2015-04-11 MED ORDER — MAGNESIUM SULFATE 2 GM/50ML IV SOLN
2.0000 g | Freq: Once | INTRAVENOUS | Status: AC
  Administered 2015-04-11: 2 g via INTRAVENOUS
  Filled 2015-04-11: qty 50

## 2015-04-11 MED ORDER — METHYLPREDNISOLONE SODIUM SUCC 125 MG IJ SOLR
125.0000 mg | Freq: Once | INTRAMUSCULAR | Status: AC
  Administered 2015-04-11: 125 mg via INTRAVENOUS
  Filled 2015-04-11: qty 2

## 2015-04-11 MED ORDER — PANTOPRAZOLE SODIUM 40 MG PO TBEC
40.0000 mg | DELAYED_RELEASE_TABLET | Freq: Every day | ORAL | Status: DC
  Administered 2015-04-12: 40 mg via ORAL
  Filled 2015-04-11: qty 1

## 2015-04-11 MED ORDER — ONDANSETRON HCL 4 MG PO TABS: 4.0000 mg | ORAL_TABLET | Freq: Four times a day (QID) | ORAL | Status: DC | PRN

## 2015-04-11 MED ORDER — ISOSORBIDE MONONITRATE ER 30 MG PO TB24
30.0000 mg | ORAL_TABLET | Freq: Every day | ORAL | Status: DC
  Administered 2015-04-12: 30 mg via ORAL
  Filled 2015-04-11 (×3): qty 1

## 2015-04-11 MED ORDER — FUROSEMIDE 10 MG/ML IJ SOLN
40.0000 mg | Freq: Two times a day (BID) | INTRAMUSCULAR | Status: DC
  Administered 2015-04-12: 40 mg via INTRAVENOUS
  Filled 2015-04-11: qty 4

## 2015-04-11 MED ORDER — CLONAZEPAM 0.5 MG PO TABS
1.0000 mg | ORAL_TABLET | Freq: Two times a day (BID) | ORAL | Status: DC
  Administered 2015-04-12 – 2015-04-13 (×3): 1 mg via ORAL
  Filled 2015-04-11 (×3): qty 2

## 2015-04-11 MED ORDER — TRAZODONE HCL 50 MG PO TABS
100.0000 mg | ORAL_TABLET | Freq: Every day | ORAL | Status: DC
  Administered 2015-04-12 (×2): 100 mg via ORAL
  Filled 2015-04-11 (×2): qty 2

## 2015-04-11 MED ORDER — NITROGLYCERIN 0.4 MG SL SUBL: 0.4000 mg | SUBLINGUAL_TABLET | SUBLINGUAL | Status: DC | PRN

## 2015-04-11 MED ORDER — SODIUM CHLORIDE 0.9 % IJ SOLN
3.0000 mL | Freq: Two times a day (BID) | INTRAMUSCULAR | Status: DC
  Administered 2015-04-12 – 2015-04-26 (×17): 3 mL via INTRAVENOUS

## 2015-04-11 MED ORDER — AMLODIPINE BESYLATE 5 MG PO TABS
10.0000 mg | ORAL_TABLET | Freq: Every day | ORAL | Status: DC
  Administered 2015-04-12: 10 mg via ORAL
  Filled 2015-04-11: qty 2

## 2015-04-11 NOTE — ED Notes (Signed)
Pt reports neck stiffness, fell this week.  Pt family reports "that he hasn't been acting right over the last 3-4 days. Pt has pink eye.  Pt has right facial droop from old stroke.  Pt having a hard time speaking.

## 2015-04-11 NOTE — ED Notes (Signed)
Received pt's ABG's from [facility]. Notified Dr. Erin HearingMessner of results.

## 2015-04-11 NOTE — ED Notes (Signed)
CRITICAL VALUE ALERT  Critical value received:  ABG PH: 7.237, CO2 85, PO2 108, Bicarb 35, Sat 96  Date of notification:  04/11/15  Time of notification:  1847  Critical value read back:Yes.    Nurse who received alert:  Rminter, RN  MD notified (1st page):  Dr. Clayborne DanaMesner  Time of first page:  1847  MD notified (2nd page):  Time of second page:  Responding MD:  Dr. Clayborne DanaMesner  Time MD responded:  [phone]

## 2015-04-11 NOTE — H&P (Signed)
[facility] History and Physical  BOND GRIESHOP ZOX:[id number] DOB: 1940-06-11 DOA: 04/11/2015  Referring physician: ER PCP: [name], MD   Chief Complaint: Dyspnea  HPI: Miguel Mendoza is a 74 y.o. male  This is a 74 year old man, diabetic, history of coronary artery disease and chronic diastolic congestive heart failure, now presents with dyspnea the last couple of days. He denies any significant cough or fever. There is no chest pain. Echocardiogram done in October 2016 at [facility] showed mild left ventricular hypertrophy with ejection fraction 55-60%. In the emergency room, he was hypoxemic and required BiPAP. However he seems to be comfortable on the BiPAP. He is going to be admitted for further management now.   Review of Systems:  Apart from symptoms above, all systems are negative.  Past Medical History  Diagnosis Date  . CAD (coronary artery disease)     Multivessel status post stent to LAD 1998 then CABG 2003  . Myocardial infarction (HCC)   . History of stroke 89  . Type 2 diabetes mellitus (HCC)   . Essential hypertension   . Dyslipidemia   . Migraine headache   . GERD (gastroesophageal reflux disease)   . Anxiety   . Cervical disc disease    Past Surgical History  Procedure Laterality Date  . Coronary artery bypass graft  2003    Dr. Cornelius Moras - LIMA to LAD, SVG to OM, SVG to RCA  . Carotid endarterectomy Left   . Back surgery    . Anterior cervical decomp/discectomy fusion     Social History:  reports that he has been smoking Cigarettes.  He has been smoking about 0.50 packs per day. He does not have any smokeless tobacco history on file. He reports that he does not drink alcohol or use illicit drugs.  Allergies  Allergen Reactions  . Ibuprofen Hives    Family History  Problem Relation Age of Onset  . Heart attack Father   . Heart attack Mother   . Heart attack Brother     Prior to Admission medications   Medication Sig Start  Date End Date Taking? Authorizing Provider  amLODipine (NORVASC) 10 MG tablet Take 10 mg by mouth daily.   Yes Historical Provider, MD  atorvastatin (LIPITOR) 20 MG tablet Take 20 mg by mouth daily.   Yes Historical Provider, MD  clonazePAM (KLONOPIN) 1 MG tablet Take 1 mg by mouth 2 (two) times daily.   Yes Historical Provider, MD  clopidogrel (PLAVIX) 75 MG tablet Take 75 mg by mouth daily.   Yes Historical Provider, MD  esomeprazole (NEXIUM) 40 MG capsule Take 40 mg by mouth daily before breakfast.   Yes Historical Provider, MD  glimepiride (AMARYL) 2 MG tablet Take 2 mg by mouth daily.    Yes Historical Provider, MD  isosorbide mononitrate (IMDUR) 30 MG 24 hr tablet Take 30 mg by mouth daily.   Yes Historical Provider, MD  lisinopril (PRINIVIL,ZESTRIL) 2.5 MG tablet Take 2.5 mg by mouth daily.   Yes Historical Provider, MD  metFORMIN (GLUCOPHAGE) 500 MG tablet Take 1,000 mg by mouth 2 (two) times daily with a meal.   Yes Historical Provider, MD  metoprolol (LOPRESSOR) 100 MG tablet Take 100 mg by mouth every morning. & 1/2 by mouth in evening   Yes Historical Provider, MD  nitroGLYCERIN (NITROSTAT) 0.4 MG SL tablet Place 0.4 mg under the tongue every 5 (five) minutes as needed.   Yes Historical Provider, MD  oxyCODONE-acetaminophen (PERCOCET) 7.5-325 MG tablet  Take 1 tablet by mouth every 4 (four) hours as needed for severe pain (4-6 hours).   Yes Historical Provider, MD  tiZANidine (ZANAFLEX) 4 MG tablet Take 4 mg by mouth 2 (two) times daily.    Yes Historical Provider, MD  traZODone (DESYREL) 100 MG tablet Take 100 mg by mouth at bedtime.   Yes Historical Provider, MD  furosemide (LASIX) 40 MG tablet Take 0.5-1 tablets (20-40 mg total) by mouth every other day. Alternated a half pill with whole pill every other day Patient taking differently: Take 40 mg by mouth daily.  03/14/15   Jonelle SidleSamuel G McDowell, MD  gabapentin (NEURONTIN) 300 MG capsule Take 300 mg by mouth 3 (three) times daily.     Historical Provider, MD   Physical Exam: Filed Vitals:   04/11/15 1937 04/11/15 1945 04/11/15 2000 04/11/15 2015  BP: 136/71  135/66   Pulse: 92 91 91 91  Temp:      TempSrc:      Resp: 18 21 17 22   Height:      Weight:      SpO2: 98% 99% 97% 96%    Wt Readings from Last 3 Encounters:  04/11/15 90.719 kg (200 lb)  02/25/15 90.901 kg (200 lb 6.4 oz)  10/16/11 87.091 kg (192 lb)    General:  Appears calm and comfortable on the BiPAP.  Eyes: PERRL, normal lids, irises & conjunctiva ENT: grossly normal hearing, lips & tongue Neck: no LAD, masses or thyromegaly Cardiovascular: RRR, no m/r/g. No LE edema. Telemetry: SR, no arrhythmias  Respiratory:decreased air entry in the right mid and lower zones with minimal breath sounds. Abdomen: soft, ntnd Skin: no rash or induration seen on limited exam Musculoskeletal: grossly normal tone BUE/BLE Psychiatric: grossly normal mood and affect, speech fluent and appropriate Neurologic: grossly non-focal.          Labs on Admission:  Basic Metabolic Panel:  Recent Labs Lab 04/11/15 1812  NA 143  K 4.6  CL 99*  CO2 35*  GLUCOSE 114*  BUN 20  CREATININE 1.35*  CALCIUM 9.5   Liver Function Tests:  Recent Labs Lab 04/11/15 1812  AST 15  ALT 8*  ALKPHOS 60  BILITOT 1.1  PROT 7.9  ALBUMIN 4.0   No results for input(s): LIPASE, AMYLASE in the last 168 hours. No results for input(s): AMMONIA in the last 168 hours. CBC:  Recent Labs Lab 04/11/15 1812  WBC 6.5  NEUTROABS 4.7  HGB 12.8*  HCT 47.3  MCV 95.2  PLT 192   Cardiac Enzymes:  Recent Labs Lab 04/11/15 1812  TROPONINI 0.05*    BNP (last 3 results)  Recent Labs  04/11/15 1812  BNP 422.0*    ProBNP (last 3 results) No results for input(s): PROBNP in the last 8760 hours.  CBG:  Recent Labs Lab 04/11/15 1814  GLUCAP 112*    Radiological Exams on Admission: Dg Chest Portable 1 View  04/11/2015  CLINICAL DATA:  Hypoxia.  Cough. EXAM:  PORTABLE CHEST 1 VIEW COMPARISON:  February 07, 2015. FINDINGS: Stable cardiomegaly. Status post coronary artery bypass graft. No pneumothorax is noted. Mild central pulmonary vascular congestion is again noted and stable. Mild left pleural effusion is noted. Moderate right pleural effusion is noted which is increased compared to prior exam, with probable underlying atelectasis or infiltrate. Bony thorax is unremarkable. IMPRESSION: Stable mild left pleural effusion. Significantly increased moderate right pleural effusion with probable underlying atelectasis or infiltrate. Electronically Signed   By: Fayrene FearingJames  Christen Butter, M.D.   On: 04/11/2015 18:31    EKG: Independently reviSinus rhythm, incomplete right bundle branch block, no acute ST-T wave changes.  Assesment/Plan   1. Large right pleural effusion. Etiology of this is not clear. I suspect this represents congestive diastolic heart failure. There could be underlying pneumonia, although he does not really have any symptoms of pneumonia. Also, his white blood cell count is not elevated and his lactic acid is normal. I will treat him with intravenous Lasix and I have ordered a thoracentesis by radiology. Cardiology consultation. 2. Respiratory failure. He is on BiPAP currently. However he does look comfortable on this and I think we can try to put him on nasal cannula oxygen. I will ask for pulmonary consultation in the morning. 3. Hypertension. Continue with all medications from home. 4. Diabetes. Continue with home medications and sliding scale of insulin.   He'll be admitted to the stepdown unit. Further recommendations will depend on patient's hospital progress.    Code Status: full code.  DVT Prophylaxis: Lovenox.  Family Communication: I discussed the plan with the patient and patient's wife at the bedside.    Disposition Plan: home when medically stable.    Time spent: 60 minutes.   Tolliver Singer [facility] Pager [phone].

## 2015-04-11 NOTE — ED Notes (Signed)
Respiratory called to be placed on bipap, md at bedside, pt sat at 49% RA, pt placed on NRB and sats currently at 95%.

## 2015-04-11 NOTE — ED Provider Notes (Signed)
CSN: [id number]     Arrival date & time 04/11/15  1741 History   First MD Initiated Contact with Patient 04/11/15 1806     Chief Complaint  Patient presents with  . Altered Mental Status     (Consider location/radiation/quality/duration/timing/severity/associated sxs/prior Treatment) Patient is a 74 y.o. male presenting with shortness of breath.  Shortness of Breath Severity:  Mild Onset quality:  Gradual Duration:  3 days Timing:  Constant Progression:  Worsening Chronicity:  New Context: not activity and not emotional upset   Relieved by:  None tried Worsened by:  Nothing tried Ineffective treatments:  None tried Associated symptoms: no abdominal pain and no fever     Past Medical History  Diagnosis Date  . CAD (coronary artery disease)     Multivessel status post stent to LAD 1998 then CABG 2003  . Myocardial infarction (HCC)   . History of stroke 74  . Type 2 diabetes mellitus (HCC)   . Essential hypertension   . Dyslipidemia   . Migraine headache   . GERD (gastroesophageal reflux disease)   . Anxiety   . Cervical disc disease    Past Surgical History  Procedure Laterality Date  . Coronary artery bypass graft  2003    Dr. Cornelius Moras - LIMA to LAD, SVG to OM, SVG to RCA  . Carotid endarterectomy Left   . Back surgery    . Anterior cervical decomp/discectomy fusion     Family History  Problem Relation Age of Onset  . Heart attack Father   . Heart attack Mother   . Heart attack Brother    Social History  Substance Use Topics  . Smoking status: Current Every Day Smoker -- 0.50 packs/day    Types: Cigarettes  . Smokeless tobacco: None  . Alcohol Use: No    Review of Systems  Unable to perform ROS: Acuity of condition  Constitutional: Negative for fever.  Eyes: Negative for photophobia and pain.  Respiratory: Positive for shortness of breath.   Gastrointestinal: Negative for abdominal pain.  Endocrine: Negative for polydipsia and polyuria.   Genitourinary: Negative for frequency and flank pain.      Allergies  Ibuprofen  Home Medications   Prior to Admission medications   Medication Sig Start Date End Date Taking? Authorizing Provider  amLODipine (NORVASC) 10 MG tablet Take 10 mg by mouth daily.   Yes Historical Provider, MD  atorvastatin (LIPITOR) 20 MG tablet Take 20 mg by mouth daily.   Yes Historical Provider, MD  clonazePAM (KLONOPIN) 1 MG tablet Take 1 mg by mouth 2 (two) times daily.   Yes Historical Provider, MD  clopidogrel (PLAVIX) 75 MG tablet Take 75 mg by mouth daily.   Yes Historical Provider, MD  esomeprazole (NEXIUM) 40 MG capsule Take 40 mg by mouth daily before breakfast.   Yes Historical Provider, MD  glimepiride (AMARYL) 2 MG tablet Take 2 mg by mouth daily.    Yes Historical Provider, MD  isosorbide mononitrate (IMDUR) 30 MG 24 hr tablet Take 30 mg by mouth daily.   Yes Historical Provider, MD  lisinopril (PRINIVIL,ZESTRIL) 2.5 MG tablet Take 2.5 mg by mouth daily.   Yes Historical Provider, MD  metFORMIN (GLUCOPHAGE) 500 MG tablet Take 1,000 mg by mouth 2 (two) times daily with a meal.   Yes Historical Provider, MD  metoprolol (LOPRESSOR) 100 MG tablet Take 100 mg by mouth every morning. & 1/2 by mouth in evening   Yes Historical Provider, MD  nitroGLYCERIN (NITROSTAT) 0.4 MG  SL tablet Place 0.4 mg under the tongue every 5 (five) minutes as needed.   Yes Historical Provider, MD  oxyCODONE-acetaminophen (PERCOCET) 7.5-325 MG tablet Take 1 tablet by mouth every 4 (four) hours as needed for severe pain (4-6 hours).   Yes Historical Provider, MD  tiZANidine (ZANAFLEX) 4 MG tablet Take 4 mg by mouth 2 (two) times daily.    Yes Historical Provider, MD  traZODone (DESYREL) 100 MG tablet Take 100 mg by mouth at bedtime.   Yes Historical Provider, MD  furosemide (LASIX) 40 MG tablet Take 0.5-1 tablets (20-40 mg total) by mouth every other day. Alternated a half pill with whole pill every other day Patient taking  differently: Take 40 mg by mouth daily.  03/14/15   Jonelle Sidle, MD  gabapentin (NEURONTIN) 300 MG capsule Take 300 mg by mouth 3 (three) times daily.    Historical Provider, MD   BP 138/73 mmHg  Pulse 114  Temp(Src) 97 F (36.1 C) (Axillary)  Resp 27  Ht 5\' 7"  (1.702 m)  Wt 190 lb 0.6 oz (86.2 kg)  BMI 29.76 kg/m2  SpO2 91% Physical Exam  Constitutional: He is oriented to person, place, and time. He appears well-developed and well-nourished.  HENT:  Head: Normocephalic and atraumatic.  Eyes: Pupils are equal, round, and reactive to light.  Neck: Normal range of motion.  Cardiovascular: Normal rate and regular rhythm.   Pulmonary/Chest: Accessory muscle usage present. He is in respiratory distress. He has decreased breath sounds. He has wheezes.  Abdominal: Soft. He exhibits no distension. There is no tenderness.  Musculoskeletal: Normal range of motion. He exhibits no edema or tenderness.  Neurological: He is alert and oriented to person, place, and time.  Skin: Skin is warm and dry.  Nursing note and vitals reviewed.   ED Course  Procedures (including critical care time)  CRITICAL CARE Performed by: Marily Memos  Total critical care time: 35 minutes Critical care time was exclusive of separately billable procedures and treating other patients. Critical care was necessary to treat or prevent imminent or life-threatening deterioration. Critical care was time spent personally by me on the following activities: development of treatment plan with patient and/or surrogate as well as nursing, discussions with consultants, evaluation of patient's response to treatment, examination of patient, obtaining history from patient or surrogate, ordering and performing treatments and interventions, ordering and review of laboratory studies, ordering and review of radiographic studies, pulse oximetry and re-evaluation of patient's condition.   Labs Review Labs Reviewed  BLOOD GAS,  ARTERIAL - Abnormal; Notable for the following:    pH, Arterial 7.237 (*)    pCO2 arterial 85.1 (*)    pO2, Arterial 108 (*)    Bicarbonate 35.0 (*)    Acid-Base Excess 5.3 (*)    All other components within normal limits  CBC WITH DIFFERENTIAL/PLATELET - Abnormal; Notable for the following:    Hemoglobin 12.8 (*)    MCH 25.8 (*)    MCHC 27.1 (*)    RDW 19.3 (*)    All other components within normal limits  COMPREHENSIVE METABOLIC PANEL - Abnormal; Notable for the following:    Chloride 99 (*)    CO2 35 (*)    Glucose, Bld 114 (*)    Creatinine, Ser 1.35 (*)    ALT 8 (*)    GFR calc non Af Amer 50 (*)    GFR calc Af Amer 59 (*)    All other components within normal limits  BRAIN  NATRIURETIC PEPTIDE - Abnormal; Notable for the following:    B Natriuretic Peptide 422.0 (*)    All other components within normal limits  TROPONIN I - Abnormal; Notable for the following:    Troponin I 0.05 (*)    All other components within normal limits  BLOOD GAS, ARTERIAL - Abnormal; Notable for the following:    pH, Arterial 7.208 (*)    pCO2 arterial 90.3 (*)    pO2, Arterial 79.7 (*)    Bicarbonate 28.0 (*)    Acid-Base Excess 6.9 (*)    All other components within normal limits  CBG MONITORING, ED - Abnormal; Notable for the following:    Glucose-Capillary 112 (*)    All other components within normal limits  MRSA PCR SCREENING  LACTIC ACID, PLASMA  TSH  BRAIN NATRIURETIC PEPTIDE  COMPREHENSIVE METABOLIC PANEL  CBC  BLOOD GAS, ARTERIAL    Imaging Review Ct Head Wo Contrast  04/11/2015  CLINICAL DATA:  Altered mental status.  Evaluate for bleed. EXAM: CT HEAD WITHOUT CONTRAST TECHNIQUE: Contiguous axial images were obtained from the base of the skull through the vertex without intravenous contrast. COMPARISON:  02/07/2015 FINDINGS: Sinuses/Soft tissues: Mild motion degradation. Clear paranasal sinuses and mastoid air cells. Intracranial: Cerebral atrophy. 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 Mild low density in the periventricular white matter likely related to small vessel disease. Vertebral and carotid atherosclerosis. No mass lesion, hemorrhage, hydrocephalus, acute infarct, intra-axial, or extra-axial fluid collection. IMPRESSION: 1.  No acute intracranial abnormality. 2. Motion degradation. 3.  Cerebral atrophy and small vessel ischemic change. 4. 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 Electronically Signed   By: Jeronimo GreavesKyle  Talbot M.D.   On: 04/11/2015 22:28   Dg Chest Portable 1 View  04/11/2015  CLINICAL DATA:  Hypoxia.  Cough. EXAM: PORTABLE CHEST 1 VIEW COMPARISON:  February 07, 2015. FINDINGS: Stable cardiomegaly. Status post coronary artery bypass graft. No pneumothorax is noted. Mild central pulmonary vascular congestion is again noted and stable. Mild left pleural effusion is noted. Moderate right pleural effusion is noted which is increased compared to prior exam, with probable underlying atelectasis or infiltrate. Bony thorax is unremarkable. IMPRESSION: Stable mild left pleural effusion. Significantly increased moderate right pleural effusion with probable underlying atelectasis or infiltrate. Electronically Signed   By: Lupita RaiderJames  Green Jr, M.D.   On: 04/11/2015 18:31   I have personally reviewed and evaluated these images and lab results as part of my medical decision-making.   EKG Interpretation   Date/Time:  Thursday April 11 2015 18:04:53 EST Ventricular Rate:  99 PR Interval:  132 QRS Duration: 112 QT Interval:  341 QTC Calculation: 438 R Axis:   80 Text Interpretation:  Sinus rhythm Multiple ventricular premature  complexes Incomplete right bundle branch block Repol abnrm suggests  ischemia, lateral leads Baseline wander in lead(s) V5 poor tracing.  no  obvious changes from august 2003 Confirmed by [facility] HospitalMESNER MD, Barbara CowerJASON [phone]([id number]) on  04/11/2015 7:23:26 PM      MDM   Final diagnoses:  Pleural effusion, right   Hypoxic respiratory failure Altered mental status Congestive heart failure exacerbation, unspecified type Non ST elevation MI    74 year old male here with hypoxic respiratory failure likely secondary to large right pleural effusion combined with  CHF exacerbation. As apparently been having decreased mental status intermittent  Over the last couple days so head CT done which was negative. Was on BiPAP for all the emergency department stay she is to maintain sats above  90%. Patient was comfortable with this. Had slight respiratory acidosis but didn't really improve with the BiPAP. However secondary to his mental status and feel the patient was a candidate for intubation this time. Did have a slightly elevated troponin however there is no chest pain, so this seems more likely secondary to demand ischemia and will not start heparin at this time. I discussed case with the hospitalist to admit the patient to stepdown for further management of his pleural effusion and likely IR drainage tomorrow.    Marily Memos, MD 04/12/15 [phone]

## 2015-04-12 ENCOUNTER — Inpatient Hospital Stay (HOSPITAL_COMMUNITY): Payer: Medicare HMO | Admitting: Anesthesiology

## 2015-04-12 ENCOUNTER — Encounter (HOSPITAL_COMMUNITY): Payer: Self-pay | Admitting: *Deleted

## 2015-04-12 ENCOUNTER — Encounter (HOSPITAL_COMMUNITY): Payer: Self-pay

## 2015-04-12 ENCOUNTER — Inpatient Hospital Stay (HOSPITAL_COMMUNITY): Payer: Medicare HMO

## 2015-04-12 DIAGNOSIS — R0600 Dyspnea, unspecified: Secondary | ICD-10-CM

## 2015-04-12 DIAGNOSIS — F17200 Nicotine dependence, unspecified, uncomplicated: Secondary | ICD-10-CM

## 2015-04-12 DIAGNOSIS — J9602 Acute respiratory failure with hypercapnia: Secondary | ICD-10-CM

## 2015-04-12 DIAGNOSIS — J189 Pneumonia, unspecified organism: Secondary | ICD-10-CM

## 2015-04-12 DIAGNOSIS — Z9911 Dependence on respirator [ventilator] status: Secondary | ICD-10-CM

## 2015-04-12 DIAGNOSIS — I5033 Acute on chronic diastolic (congestive) heart failure: Secondary | ICD-10-CM

## 2015-04-12 DIAGNOSIS — J441 Chronic obstructive pulmonary disease with (acute) exacerbation: Secondary | ICD-10-CM

## 2015-04-12 LAB — TRIGLYCERIDES: TRIGLYCERIDES: 72 mg/dL (ref <150)

## 2015-04-12 LAB — BLOOD GAS, ARTERIAL
ACID-BASE EXCESS: 7.7 mmol/L — AB (ref 0.0–2.0)
Acid-Base Excess: 9 mmol/L — ABNORMAL HIGH (ref 0.0–2.0)
Bicarbonate: 29.3 mEq/L — ABNORMAL HIGH (ref 20.0–24.0)
Bicarbonate: 32.9 mEq/L — ABNORMAL HIGH (ref 20.0–24.0)
DRAWN BY: 277331
Delivery systems: POSITIVE
Drawn by: 22223
EXPIRATORY PAP: 5
FIO2: 75
FIO2: 0.7
INSPIRATORY PAP: 16
LHR: 10 {breaths}/min
MECHVT: 530 mL
O2 SAT: 90.5 %
O2 Saturation: 98.5 %
PATIENT TEMPERATURE: 37
PATIENT TEMPERATURE: 37
PCO2 ART: 77 mmHg — AB (ref 35.0–45.0)
PCO2 ART: 38.6 mmHg (ref 35.0–45.0)
PEEP/CPAP: 5 cmH2O
PH ART: 7.272 — AB (ref 7.350–7.450)
PH ART: 7.533 — AB (ref 7.350–7.450)
PO2 ART: 68.4 mmHg — AB (ref 80.0–100.0)
PO2 ART: 98.5 mmHg (ref 80.0–100.0)
RATE: 14 resp/min
TCO2: 14.7 mmol/L (ref 0–100)

## 2015-04-12 LAB — COMPREHENSIVE METABOLIC PANEL
ALT: 7 U/L — AB (ref 17–63)
ANION GAP: 8 (ref 5–15)
AST: 12 U/L — ABNORMAL LOW (ref 15–41)
Albumin: 3.4 g/dL — ABNORMAL LOW (ref 3.5–5.0)
Alkaline Phosphatase: 53 U/L (ref 38–126)
BUN: 22 mg/dL — ABNORMAL HIGH (ref 6–20)
CALCIUM: 9.2 mg/dL (ref 8.9–10.3)
CHLORIDE: 100 mmol/L — AB (ref 101–111)
CO2: 34 mmol/L — AB (ref 22–32)
CREATININE: 1.44 mg/dL — AB (ref 0.61–1.24)
GFR, EST AFRICAN AMERICAN: 54 mL/min — AB (ref >60)
GFR, EST NON AFRICAN AMERICAN: 47 mL/min — AB (ref >60)
Glucose, Bld: 161 mg/dL — ABNORMAL HIGH (ref 65–99)
Potassium: 5.1 mmol/L (ref 3.5–5.1)
SODIUM: 142 mmol/L (ref 135–145)
Total Bilirubin: 1 mg/dL (ref 0.3–1.2)
Total Protein: 6.9 g/dL (ref 6.5–8.1)

## 2015-04-12 LAB — GLUCOSE, CAPILLARY
GLUCOSE-CAPILLARY: 101 mg/dL — AB (ref 65–99)
GLUCOSE-CAPILLARY: 102 mg/dL — AB (ref 65–99)
Glucose-Capillary: 140 mg/dL — ABNORMAL HIGH (ref 65–99)
Glucose-Capillary: 151 mg/dL — ABNORMAL HIGH (ref 65–99)
Glucose-Capillary: 68 mg/dL (ref 65–99)

## 2015-04-12 LAB — CBC
HCT: 44.4 % (ref 39.0–52.0)
HEMOGLOBIN: 11.8 g/dL — AB (ref 13.0–17.0)
MCH: 25.2 pg — AB (ref 26.0–34.0)
MCHC: 26.6 g/dL — ABNORMAL LOW (ref 30.0–36.0)
MCV: 94.9 fL (ref 78.0–100.0)
PLATELETS: 180 10*3/uL (ref 150–400)
RBC: 4.68 MIL/uL (ref 4.22–5.81)
RDW: 19.4 % — ABNORMAL HIGH (ref 11.5–15.5)
WBC: 4.2 10*3/uL (ref 4.0–10.5)

## 2015-04-12 LAB — BRAIN NATRIURETIC PEPTIDE: B NATRIURETIC PEPTIDE 5: 520 pg/mL — AB (ref 0.0–100.0)

## 2015-04-12 LAB — LACTATE DEHYDROGENASE: LDH: 119 U/L (ref 98–192)

## 2015-04-12 LAB — MRSA PCR SCREENING: MRSA by PCR: NEGATIVE

## 2015-04-12 MED ORDER — SODIUM CHLORIDE 0.9 % IV SOLN
INTRAVENOUS | Status: DC | PRN
  Administered 2015-04-12: 10:00:00 via INTRAVENOUS

## 2015-04-12 MED ORDER — SUCCINYLCHOLINE CHLORIDE 20 MG/ML IJ SOLN
INTRAMUSCULAR | Status: DC | PRN
  Administered 2015-04-12: 100 mg via INTRAVENOUS

## 2015-04-12 MED ORDER — PANTOPRAZOLE SODIUM 40 MG IV SOLR
40.0000 mg | Freq: Every day | INTRAVENOUS | Status: DC
  Administered 2015-04-12 – 2015-04-26 (×14): 40 mg via INTRAVENOUS
  Filled 2015-04-12 (×15): qty 40

## 2015-04-12 MED ORDER — PROPOFOL 1000 MG/100ML IV EMUL
INTRAVENOUS | Status: AC
  Administered 2015-04-12: 20 ug/kg/min via INTRAVENOUS
  Filled 2015-04-12: qty 100

## 2015-04-12 MED ORDER — ANTISEPTIC ORAL RINSE SOLUTION (CORINZ)
7.0000 mL | Freq: Four times a day (QID) | OROMUCOSAL | Status: DC
  Administered 2015-04-12 – 2015-04-24 (×49): 7 mL via OROMUCOSAL

## 2015-04-12 MED ORDER — FENTANYL CITRATE (PF) 100 MCG/2ML IJ SOLN
50.0000 ug | INTRAMUSCULAR | Status: DC | PRN
  Administered 2015-04-12: 50 ug via INTRAVENOUS
  Filled 2015-04-12: qty 2

## 2015-04-12 MED ORDER — FUROSEMIDE 40 MG PO TABS: 40.0000 mg | ORAL_TABLET | Freq: Every day | ORAL | Status: DC

## 2015-04-12 MED ORDER — CHLORHEXIDINE GLUCONATE 0.12% ORAL RINSE (MEDLINE KIT)
15.0000 mL | Freq: Two times a day (BID) | OROMUCOSAL | Status: DC
  Administered 2015-04-12 – 2015-04-24 (×26): 15 mL via OROMUCOSAL

## 2015-04-12 MED ORDER — PROPOFOL 1000 MG/100ML IV EMUL
0.0000 ug/kg/min | INTRAVENOUS | Status: DC
  Administered 2015-04-12: 11:00:00 via INTRAVENOUS
  Administered 2015-04-12: 20 ug/kg/min via INTRAVENOUS
  Administered 2015-04-12 (×3): 40 ug/kg/min via INTRAVENOUS
  Administered 2015-04-13: 45 ug/kg/min via INTRAVENOUS
  Administered 2015-04-13: 50 ug/kg/min via INTRAVENOUS
  Administered 2015-04-13: 45 ug/kg/min via INTRAVENOUS
  Administered 2015-04-13 – 2015-04-14 (×8): 50 ug/kg/min via INTRAVENOUS
  Administered 2015-04-14: 49.981 ug/kg/min via INTRAVENOUS
  Administered 2015-04-14 – 2015-04-15 (×4): 50 ug/kg/min via INTRAVENOUS
  Administered 2015-04-15: 49.981 ug/kg/min via INTRAVENOUS
  Administered 2015-04-15 – 2015-04-16 (×4): 50 ug/kg/min via INTRAVENOUS
  Administered 2015-04-16: 60 ug/kg/min via INTRAVENOUS
  Administered 2015-04-16: 70 ug/kg/min via INTRAVENOUS
  Administered 2015-04-16 (×4): 50 ug/kg/min via INTRAVENOUS
  Administered 2015-04-17 (×7): 70 ug/kg/min via INTRAVENOUS
  Administered 2015-04-17 (×2): 60 ug/kg/min via INTRAVENOUS
  Administered 2015-04-17: 70 ug/kg/min via INTRAVENOUS
  Administered 2015-04-18 (×5): 50 ug/kg/min via INTRAVENOUS
  Administered 2015-04-18: 60 ug/kg/min via INTRAVENOUS
  Administered 2015-04-19 – 2015-04-22 (×19): 50 ug/kg/min via INTRAVENOUS
  Filled 2015-04-12 (×5): qty 100
  Filled 2015-04-12: qty 200
  Filled 2015-04-12 (×11): qty 100
  Filled 2015-04-12 (×2): qty 200
  Filled 2015-04-12 (×2): qty 100
  Filled 2015-04-12: qty 500
  Filled 2015-04-12 (×9): qty 100
  Filled 2015-04-12: qty 200
  Filled 2015-04-12: qty 100
  Filled 2015-04-12 (×2): qty 200
  Filled 2015-04-12 (×5): qty 100
  Filled 2015-04-12: qty 200
  Filled 2015-04-12 (×6): qty 100
  Filled 2015-04-12: qty 400
  Filled 2015-04-12 (×7): qty 100
  Filled 2015-04-12: qty 200

## 2015-04-12 MED ORDER — FENTANYL CITRATE (PF) 100 MCG/2ML IJ SOLN
50.0000 ug | INTRAMUSCULAR | Status: DC | PRN
  Administered 2015-04-13 – 2015-04-20 (×2): 50 ug via INTRAVENOUS
  Filled 2015-04-12 (×2): qty 2

## 2015-04-12 MED ORDER — PROPOFOL 10 MG/ML IV BOLUS
INTRAVENOUS | Status: DC | PRN
  Administered 2015-04-12: 70 mg via INTRAVENOUS

## 2015-04-12 MED ORDER — DEXTROSE 5 % IV SOLN
1.0000 g | INTRAVENOUS | Status: DC
  Administered 2015-04-12 – 2015-04-24 (×13): 1 g via INTRAVENOUS
  Filled 2015-04-12 (×13): qty 10

## 2015-04-12 MED ORDER — INSULIN ASPART 100 UNIT/ML ~~LOC~~ SOLN
0.0000 [IU] | SUBCUTANEOUS | Status: DC
  Administered 2015-04-13 – 2015-04-17 (×12): 2 [IU] via SUBCUTANEOUS
  Administered 2015-04-18: 3 [IU] via SUBCUTANEOUS
  Administered 2015-04-18 – 2015-04-19 (×2): 2 [IU] via SUBCUTANEOUS
  Administered 2015-04-19 (×4): 3 [IU] via SUBCUTANEOUS
  Administered 2015-04-20 (×2): 2 [IU] via SUBCUTANEOUS
  Administered 2015-04-20: 3 [IU] via SUBCUTANEOUS
  Administered 2015-04-20 – 2015-04-21 (×3): 2 [IU] via SUBCUTANEOUS
  Administered 2015-04-21: 5 [IU] via SUBCUTANEOUS
  Administered 2015-04-21: 2 [IU] via SUBCUTANEOUS
  Administered 2015-04-22 (×5): 3 [IU] via SUBCUTANEOUS
  Administered 2015-04-22 – 2015-04-23 (×3): 5 [IU] via SUBCUTANEOUS
  Administered 2015-04-23 (×2): 3 [IU] via SUBCUTANEOUS

## 2015-04-12 MED ORDER — METHYLPREDNISOLONE SODIUM SUCC 40 MG IJ SOLR
40.0000 mg | Freq: Two times a day (BID) | INTRAMUSCULAR | Status: DC
  Administered 2015-04-12 – 2015-04-23 (×23): 40 mg via INTRAVENOUS
  Filled 2015-04-12 (×23): qty 1

## 2015-04-12 MED ORDER — DEXTROSE 5 % IV SOLN
500.0000 mg | INTRAVENOUS | Status: DC
  Administered 2015-04-12 – 2015-04-24 (×13): 500 mg via INTRAVENOUS
  Filled 2015-04-12 (×13): qty 500

## 2015-04-12 NOTE — Care Management Note (Signed)
Case Management Note  Patient Details  Name: York GriceMarvin C Dixson MRN: [id number] Date of Birth: March 26, 1941  Subjective/Objective:                  Pt admitted from home with pleural effusion and respiratory failure. Pt currently intubated. Pts wife not at the bedside at this time.  Action/Plan: Will continue to follow for discharge planning needs.  Expected Discharge Date:  04/15/15               Expected Discharge Plan:  Home w Home Health Services  In-House Referral:  NA  Discharge planning Services  CM Consult  Post Acute Care Choice:    Choice offered to:     DME Arranged:    DME Agency:     HH Arranged:    HH Agency:     Status of Service:  In process, will continue to follow  Medicare Important Message Given:  Yes Date Medicare IM Given:    Medicare IM give by:    Date Additional Medicare IM Given:    Additional Medicare Important Message give by:     If discussed at Long Length of Stay Meetings, dates discussed:    Additional Comments:  Cheryl FlashBlackwell, Shamarra Warda Crowder, RN 04/12/2015, 3:07 PM

## 2015-04-12 NOTE — Consult Note (Signed)
CARDIOLOGY CONSULT NOTE   Patient ID: York GriceMarvin C Legacy MRN: [id number] DOB/AGE: 73-Aug-1942 73 y.o.  Admit Date: 04/11/2015 [facility] Primary Physician: [name],ASHISH, MD Consulting Cardiologist: Dina RichBranch, Winifred Bodiford MD Primary Cardiologist: Diona BrownerMcDowell. Remi DeterSamuel MD/[place], [facility] Reason for Consultation: CHF with pleural effusion, hx of CAD  Clinical Summary Mr. 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  O2 Sat in ER was 65%, with HR of 48 on arrival, he required BiPAP. Hx of COPD. [facility] completed in 01/2015 but unable to access results in Care Everywhere.   CXR demonstrated mild left pleural effusion. Significantly increased moderate right pleural effusion with probable underlying atelectasis or infiltrate. Troponin 0.05. Respiratory acidosis was noted. BNP 422.   He was treated with duoneb, IV steroids, magnesium, zanaflex, and insulin. He is being treated with antibiotics, IV lasix 40 mg BID, with minimal urine output since admission.   Allergies  Allergen Reactions  . Ibuprofen Hives    Medications Scheduled Medications: . amLODipine  10 mg Oral Daily  . atorvastatin  20 mg Oral Daily  . azithromycin  500 mg Intravenous Q24H  . cefTRIAXone (ROCEPHIN)  IV  1 g Intravenous Q24H  . clonazePAM  1 mg Oral BID  . enoxaparin (LOVENOX) injection  40 mg Subcutaneous Q24H  . furosemide  40 mg Intravenous Q12H  . gabapentin  300 mg Oral TID  . glimepiride  2 mg Oral Daily  . insulin aspart  0-15 Units Subcutaneous TID WC  . insulin aspart  0-5 Units Subcutaneous QHS  . ipratropium-albuterol  3 mL Nebulization Q4H  . isosorbide mononitrate  30 mg Oral Daily  . lisinopril  2.5 mg Oral Daily  . metFORMIN  1,000 mg Oral BID WC  . methylPREDNISolone (SOLU-MEDROL)  injection  40 mg Intravenous Q12H  . metoprolol  100 mg Oral q morning - 10a  . pantoprazole  40 mg Oral Daily  . sodium chloride  3 mL Intravenous Q12H  . tiZANidine  4 mg Oral BID  . traZODone  100 mg Oral QHS    Infusions:    PRN Medications: nitroGLYCERIN, ondansetron **OR** ondansetron (ZOFRAN) IV, oxyCODONE-acetaminophen   Past Medical History  Diagnosis Date  . CAD (coronary artery disease)     Multivessel status post stent to LAD 1998 then CABG 2003  . Myocardial infarction (HCC)   . History of stroke 741997  . Type 2 diabetes mellitus (HCC)   . Essential hypertension   . Dyslipidemia   . Migraine headache   . GERD (gastroesophageal reflux disease)   . Anxiety   . Cervical disc disease     Past Surgical History  Procedure Laterality Date  . Coronary artery bypass graft  2003    Dr. Cornelius Moraswen - LIMA to LAD, SVG to OM, SVG to RCA  . Carotid endarterectomy Left   . Back surgery    . Anterior cervical decomp/discectomy fusion      Family History  Problem Relation Age of Onset  . Heart attack Father   . Heart attack Mother   . Heart attack Brother     Social History Mr. Miguel Mendoza reports that he has been smoking Cigarettes.  He has been smoking about 0.50 packs per day. He does not have any smokeless tobacco history on file. Mr. Miguel Mendoza  reports that he does not drink alcohol.  Review of Systems Complete review of systems are found to be negative unless outlined in H&P above.  Physical Examination Blood pressure 109/60, pulse 87, temperature 98.1 F (36.7 C), temperature source Axillary, resp. rate 13, height 5\' 7"  (1.702 m), weight 188 lb 15 oz (85.7 kg), SpO2 93 %.  Intake/Output Summary (Last 24 hours) at 04/12/15 0903 Last data filed at 04/12/15 0500  Gross per 24 hour  Intake      0 ml  Output    550 ml  Net   -550 ml    Telemetry: Sinus tachycardic in the 90's. RBBB  GEN: Sedated, cannot arouse.  HEENT: Conjunctiva and lids normal, oropharynx clear  with moist mucosa. Neck: Supple, no elevated JVP or carotid bruits, no thyromegaly. Lungs: Inspiratory rales, diminished in the bases bilaterally. Assessment limited by his sedation. Wearing CPAP.  Cardiac: Regular rate and rhythm, 1/6 systolic murmur,r, no pericardial rub. Abdomen: Soft, nontender, no hepatomegaly, bowel sounds present, no guarding or rebound. Extremities: No pitting edema, distal pulses 2+. Skin: Warm and dry. Musculoskeletal: No kyphosis. Neuropsychiatric: Sedated.   Prior Cardiac Testing/Procedures 1. Echocardiogram 12/01/2001 LEFT VENTRICLE: - LVEF is approximately 50% with inferior hypokinesis. - Left ventricular size was normal. - Left ventricular wall thickness was normal.  AORTIC VALVE: - Aortic valve thickness was mildly increased.  Doppler interpretation(s): - There was no significant aortic valvular regurgitation.  MITRAL VALVE: - Mitral valve structure was normal.  Doppler interpretation(s): - There was trivial mitral valvular regurgitation.  LEFT ATRIUM: - Left atrial size was normal. RIGHT VENTRICLE: - Right ventricular size was normal. - Right ventricular systolic function was normal.  PULMONIC VALVE: - The pulmonic valve was not well visualized.  Doppler interpretation(s): - There was no significant pulmonic regurgitation.  TRICUSPID VALVE: - The tricuspid valve structure was normal.   Doppler interpretation(s): - There was mild tricuspid valvular regurgitation.  RIGHT ATRIUM: - Right atrial size was normal.  PERICARDIUM: - There was no pericardial effusion.  NM Stress Test: 11/14/2012 (From Scanned Document) Non-diagnostic stress test, Non-diagnostic changes, secondary to resting ST-T abnormalities,. There was blunted heart rate response with blunted blood pressure response to stress. The test was terminated due to completed of vasodilator infusion   Abnormal regdenoson myocardial perfusion imagining.with a  moderated sized area of moderately diminished uptake in the septal region which is fixed, suggestive of a scar. There were small areas of moderately diminished uptake in the mid-inferior, mid-inferoseptal, and inferoapical walls with minimal reveresibility, suggestive of infarct with minimal degree of peri-infarct ischemia.    Lab Results  Basic Metabolic Panel:  Recent Labs Lab 04/11/15 1812 04/12/15 0449  NA 143 142  K 4.6 5.1  CL 99* 100*  CO2 35* 34*  GLUCOSE 114* 161*  BUN 20 22*  CREATININE 1.35* 1.44*  CALCIUM 9.5 9.2    Liver Function Tests:  Recent Labs Lab 04/11/15 1812 04/12/15 0449  AST 15 12*  ALT 8* 7*  ALKPHOS 60 53  BILITOT 1.1 1.0  PROT 7.9 6.9  ALBUMIN 4.0 3.4*    CBC:  Recent Labs Lab 04/11/15 1812 04/12/15 0449  WBC 6.5 4.2  NEUTROABS 4.7  --   HGB 12.8* 11.8*  HCT 47.3 44.4  MCV 95.2 94.9  PLT 192 180    Cardiac Enzymes:  Recent Labs Lab 04/11/15 1812  TROPONINI 0.05*    Radiology: Ct Head Wo Contrast  04/11/2015  CLINICAL DATA:  Altered mental status.  Evaluate  for bleed. EXAM: CT HEAD WITHOUT CONTRAST TECHNIQUE: Contiguous axial images were obtained from the base of the skull through the vertex without intravenous contrast. COMPARISON:  02/07/2015 FINDINGS: Sinuses/Soft tissues: Mild motion degradation. Clear paranasal sinuses and mastoid air cells. Intracranial: Cerebral atrophy. 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 Mild low density in the periventricular white matter likely related to small vessel disease. Vertebral and carotid atherosclerosis. No mass lesion, hemorrhage, hydrocephalus, acute infarct, intra-axial, or extra-axial fluid collection. IMPRESSION: 1.  No acute intracranial abnormality. 2. Motion degradation. 3.  Cerebral atrophy and small vessel ischemic change. 4. 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 Electronically Signed   By: Jeronimo Greaves M.D.   On: 04/11/2015 22:28   Dg  Chest Portable 1 View  04/11/2015  CLINICAL DATA:  Hypoxia.  Cough. EXAM: PORTABLE CHEST 1 VIEW COMPARISON:  February 07, 2015. FINDINGS: Stable cardiomegaly. Status post coronary artery bypass graft. No pneumothorax is noted. Mild central pulmonary vascular congestion is again noted and stable. Mild left pleural effusion is noted. Moderate right pleural effusion is noted which is increased compared to prior exam, with probable underlying atelectasis or infiltrate. Bony thorax is unremarkable. IMPRESSION: Stable mild left pleural effusion. Significantly increased moderate right pleural effusion with probable underlying atelectasis or infiltrate. Electronically Signed   By: Lupita Raider, M.D.   On: 04/11/2015 18:31     ECG: Sinus tachycardia with incompleted RBBB with frequent PVC's.   Impression and Recommendations  1. Diastolic CHF; No evidence of decompensation currently. No edema or abdominal distention. Will repeat limited echo. Continue lasix until LV fx is assessed. Creatinine is rising slightly 1.44 from 1.35.  Very little urine output. Evaluate LV fx for medical management.   2. Bilateral pleural effusion:  Right greater than left. He is being seen by pulmonology. Started on antibiotics and steroids. CT is being completed for better assessment of lung status and effusion.   3. CAD: Hx of stent to LAD and CABG. Troponin 0,05 and not repeated. EKG demonstrates RBBB with frequent PVC's. Creatinine 1.44.   4.  COPD: Defer to Dr. Juanetta Gosling.   Signed: Bettey Mare. Lawrence NP [facility]  04/12/2015, 9:03 AM Co-Sign MD  Patient seen and discussed with NP Lyman Bishop, I agree with her documentation. 74 yo male history of CAD with prior stenting and CABG, DM2, HTN, HL, hx of CVA, chronic diastolic heart failure admitted with SOB.   01/2015 echo: LVEF 55-60%, mod RV dilatation with evidence of pressure and volume overload, diastolic function not described, PASP not reported.  TSH 1, BNP 422, Cr 1.35,  BUN 20, Hgb 12.8, Plt 192,  ABG: 7.23/85/108 CXR mild left effusion, moderate right effusion with ? Atelectasis vs infiltrate   Patient admitted with SOB. ABG shows combined hypercapneic and hypoxic respiratory failure, however hypercapnea is the primary process. Typically CHF would not cause this degree of hypercapnea, with his smoking history would suspect COPD. He has asymmetric pleural effusions I am not convinced are related to CHF, we will see what the thoracentesis shows. Agree with CT chest as well.   From CHF standpoint his weight is stable on admission around 200 lbs, BNP is somewhat elevated. Uptrend in Cr and BUN with IV lasix this AM. Will d/c IV lasix and change back to his him oral lasix. I am not convinced the primary issue is CHF. I would f/u results of thoracentesis, chest CT, and response to COPD management prior to any further aggressive diuresis.   No further cardiac  testing or interventions planned at this time. We will f/u echo results. We will sign off inpatient care. Please contact us if needed.   Dominga Ferry MD

## 2015-04-12 NOTE — Progress Notes (Signed)
PT INTUBATED W/ A 7.5 et TUBE BY BOB IDACABBAGE CRNA. PT TOLERATED PROCEDURE WELL. FIO2 70%.14 RR. TV 600 . PEEP 5. 22 ATL.

## 2015-04-12 NOTE — Consult Note (Addendum)
Consult requested by: [facility] hospital[facility] Consult requested for respiratory failure/pleural effusion:  HPI: This is a 74 year old who came to the emergency department with increasing shortness of breath. He was found to have respiratory failure and a moderate to large right pleural effusion and a smaller left pleural effusion. He is known to have coronary artery occlusive disease, diabetes and chronic diastolic heart failure. He does not have a diagnosis of COPD but he does have a significant smoking history and chest x-ray is at least suggestive that he may have COPD. He is on BiPAP now and not really able to provide any meaningful history. Based on medical records he did not have fever or chills no cough and no chest pain. He has remained hypoxic requiring 75% oxygen and has continued acidemic despite BiPAP.  Past Medical History  Diagnosis Date  . CAD (coronary artery disease)     Multivessel status post stent to LAD 1998 then CABG 2003  . Myocardial infarction (HCC)   . History of stroke 67  . Type 2 diabetes mellitus (HCC)   . Essential hypertension   . Dyslipidemia   . Migraine headache   . GERD (gastroesophageal reflux disease)   . Anxiety   . Cervical disc disease      Family History  Problem Relation Age of Onset  . Heart attack Father   . Heart attack Mother   . Heart attack Brother      Social History   Social History  . Marital Status: Divorced    Spouse Name: N/A  . Number of Children: N/A  . Years of Education: N/A   Social History Main Topics  . Smoking status: Current Every Day Smoker -- 0.50 packs/day    Types: Cigarettes  . Smokeless tobacco: None  . Alcohol Use: No  . Drug Use: No  . Sexual Activity: Not Currently   Other Topics Concern  . None   Social History Narrative     ROS: Not really obtainable    Objective: Vital signs in last 24 hours: Temp:  [97 F (36.1 C)-98.2 F (36.8 C)] 97 F (36.1 C) (12/16 0400) Pulse Rate:  [42-114] 96  (12/16 0500) Resp:  [9-27] 23 (12/16 0500) BP: (105-139)/(52-95) 121/64 mmHg (12/16 0406) SpO2:  [65 %-100 %] 96 % (12/16 0500) FiO2 (%):  [0 %-80 %] 75 % (12/16 0409) Weight:  [85.7 kg (188 lb 15 oz)-90.719 kg (200 lb)] 85.7 kg (188 lb 15 oz) (12/16 0500) Weight change:  Last BM Date: 04/10/15  Intake/Output from previous day: 12/15 0701 - 12/16 0700 In: -  Out: 550 [Urine:550]  PHYSICAL EXAM He is sleepy. He is on BiPAP. He looks relatively comfortable. HEENT generally unremarkable. Mucous membranes are moist his neck is supple I don't see any JVD with him sitting upright. His chest shows diminished breath sounds and some rhonchi on the right and rhonchi on the left. His heart is regular without gallop.Abdomen is soft without masses. he does not have any edema. Central nervous system examination is grossly intact   Lab Results: Basic Metabolic Panel:  Recent Labs  40/98/11 1812 04/12/15 0449  NA 143 142  K 4.6 5.1  CL 99* 100*  CO2 35* 34*  GLUCOSE 114* 161*  BUN 20 22*  CREATININE 1.35* 1.44*  CALCIUM 9.5 9.2   Liver Function Tests:  Recent Labs  04/11/15 1812 04/12/15 0449  AST 15 12*  ALT 8* 7*  ALKPHOS 60 53  BILITOT 1.1 1.0  PROT 7.9  6.9  ALBUMIN 4.0 3.4*   No results for input(s): LIPASE, AMYLASE in the last 72 hours. No results for input(s): AMMONIA in the last 72 hours. CBC:  Recent Labs  04/11/15 1812 04/12/15 0449  WBC 6.5 4.2  NEUTROABS 4.7  --   HGB 12.8* 11.8*  HCT 47.3 44.4  MCV 95.2 94.9  PLT 192 180   Cardiac Enzymes:  Recent Labs  04/11/15 1812  TROPONINI 0.05*   BNP: No results for input(s): PROBNP in the last 72 hours. D-Dimer: No results for input(s): DDIMER in the last 72 hours. CBG:  Recent Labs  04/11/15 1814  GLUCAP 112*   Hemoglobin A1C: No results for input(s): HGBA1C in the last 72 hours. Fasting Lipid Panel: No results for input(s): CHOL, HDL, LDLCALC, TRIG, CHOLHDL, LDLDIRECT in the last 72  hours. Thyroid Function Tests:  Recent Labs  04/11/15 1812  TSH 1.082   Anemia Panel: No results for input(s): VITAMINB12, FOLATE, FERRITIN, TIBC, IRON, RETICCTPCT in the last 72 hours. Coagulation: No results for input(s): LABPROT, INR in the last 72 hours. Urine Drug Screen: Drugs of Abuse  No results found for: LABOPIA, COCAINSCRNUR, LABBENZ, AMPHETMU, THCU, LABBARB  Alcohol Level: No results for input(s): ETH in the last 72 hours. Urinalysis: No results for input(s): COLORURINE, LABSPEC, PHURINE, GLUCOSEU, HGBUR, BILIRUBINUR, KETONESUR, PROTEINUR, UROBILINOGEN, NITRITE, LEUKOCYTESUR in the last 72 hours.  Invalid input(s): APPERANCEUR Misc. Labs:   ABGS:  Recent Labs  04/12/15 0502  PHART 7.272*  PO2ART 68.4*  TCO2 14.7  HCO3 29.3*     MICROBIOLOGY: No results found for this or any previous visit (from the past 240 hour(s)).  Studies/Results: Ct Head Wo Contrast  04/11/2015  CLINICAL DATA:  Altered mental status.  Evaluate for bleed. EXAM: CT HEAD WITHOUT CONTRAST TECHNIQUE: Contiguous axial images were obtained from the base of the skull through the vertex without intravenous contrast. COMPARISON:  02/07/2015 FINDINGS: Sinuses/Soft tissues: Mild motion degradation. Clear paranasal sinuses and mastoid air cells. Intracranial: Cerebral atrophy. 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 Mild low density in the periventricular white matter likely related to small vessel disease. Vertebral and carotid atherosclerosis. No mass lesion, hemorrhage, hydrocephalus, acute infarct, intra-axial, or extra-axial fluid collection. IMPRESSION: 1.  No acute intracranial abnormality. 2. Motion degradation. 3.  Cerebral atrophy and small vessel ischemic change. 4. 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 Electronically Signed   By: Jeronimo Greaves M.D.   On: 04/11/2015 22:28   Dg Chest Portable 1 View  04/11/2015  CLINICAL DATA:  Hypoxia.  Cough. EXAM:  PORTABLE CHEST 1 VIEW COMPARISON:  February 07, 2015. FINDINGS: Stable cardiomegaly. Status post coronary artery bypass graft. No pneumothorax is noted. Mild central pulmonary vascular congestion is again noted and stable. Mild left pleural effusion is noted. Moderate right pleural effusion is noted which is increased compared to prior exam, with probable underlying atelectasis or infiltrate. Bony thorax is unremarkable. IMPRESSION: Stable mild left pleural effusion. Significantly increased moderate right pleural effusion with probable underlying atelectasis or infiltrate. Electronically Signed   By: Lupita Raider, M.D.   On: 04/11/2015 18:31    Medications:  Prior to Admission:  Prescriptions prior to admission  Medication Sig Dispense Refill Last Dose  . amLODipine (NORVASC) 10 MG tablet Take 10 mg by mouth daily.   04/11/2015 at Unknown time  . atorvastatin (LIPITOR) 20 MG tablet Take 20 mg by mouth daily.   04/11/2015 at Unknown time  . clonazePAM (KLONOPIN) 1 MG tablet Take  1 mg by mouth 2 (two) times daily.   04/11/2015 at Unknown time  . clopidogrel (PLAVIX) 75 MG tablet Take 75 mg by mouth daily.   04/11/2015 at Unknown time  . esomeprazole (NEXIUM) 40 MG capsule Take 40 mg by mouth daily before breakfast.   04/11/2015 at Unknown time  . glimepiride (AMARYL) 2 MG tablet Take 2 mg by mouth daily.    04/11/2015 at Unknown time  . isosorbide mononitrate (IMDUR) 30 MG 24 hr tablet Take 30 mg by mouth daily.   04/11/2015 at Unknown time  . lisinopril (PRINIVIL,ZESTRIL) 2.5 MG tablet Take 2.5 mg by mouth daily.   04/11/2015 at Unknown time  . metFORMIN (GLUCOPHAGE) 500 MG tablet Take 1,000 mg by mouth 2 (two) times daily with a meal.   04/11/2015 at Unknown time  . metoprolol (LOPRESSOR) 100 MG tablet Take 100 mg by mouth every morning. & 1/2 by mouth in evening   04/11/2015 at Unknown time  . nitroGLYCERIN (NITROSTAT) 0.4 MG SL tablet Place 0.4 mg under the tongue every 5 (five) minutes as  needed.   Taking  . oxyCODONE-acetaminophen (PERCOCET) 7.5-325 MG tablet Take 1 tablet by mouth every 4 (four) hours as needed for severe pain (4-6 hours).   Taking  . tiZANidine (ZANAFLEX) 4 MG tablet Take 4 mg by mouth 2 (two) times daily.    04/11/2015 at Unknown time  . traZODone (DESYREL) 100 MG tablet Take 100 mg by mouth at bedtime.   04/10/2015 at Unknown time  . furosemide (LASIX) 40 MG tablet Take 0.5-1 tablets (20-40 mg total) by mouth every other day. Alternated a half pill with whole pill every other day (Patient taking differently: Take 40 mg by mouth daily. ) 115 tablet 3   . gabapentin (NEURONTIN) 300 MG capsule Take 300 mg by mouth 3 (three) times daily.   Taking   Scheduled: . amLODipine  10 mg Oral Daily  . atorvastatin  20 mg Oral Daily  . clonazePAM  1 mg Oral BID  . enoxaparin (LOVENOX) injection  40 mg Subcutaneous Q24H  . furosemide  40 mg Intravenous Q12H  . gabapentin  300 mg Oral TID  . glimepiride  2 mg Oral Daily  . insulin aspart  0-15 Units Subcutaneous TID WC  . insulin aspart  0-5 Units Subcutaneous QHS  . ipratropium-albuterol  3 mL Nebulization Q4H  . isosorbide mononitrate  30 mg Oral Daily  . lisinopril  2.5 mg Oral Daily  . metFORMIN  1,000 mg Oral BID WC  . metoprolol  100 mg Oral q morning - 10a  . pantoprazole  40 mg Oral Daily  . sodium chloride  3 mL Intravenous Q12H  . tiZANidine  4 mg Oral BID  . traZODone  100 mg Oral QHS   Continuous:  ZOX:[id number], ondansetron **OR** ondansetron (ZOFRAN) IV, oxyCODONE-acetaminophen  Assesment: He has acute respiratory failure. This is both hypoxic and hypercapnic. He is requiring 75% oxygen still. Some of this is related to a right pleural effusion but I think he also probably has significant COPD with his smoking history. I'm concerned that he may have an underlying pneumonia or mass based on the appearance of his chest x-ray. This can be better assessed after thoracentesis. If he doesn't improve  after thoracentesis he may require intubation and mechanical ventilation.   He has what appears to be chronic renal failure and this is stable over 24 hours.   BNP is modestly elevated at 520. He had echocardiogram done  about 2 months ago at another institution that by history shows good systolic heart function some left ventricular hypertrophy and no comment by history about diastolic dysfunction. However there is a comment that may indicate that he has some right ventricular failure (cor pulmonale)  He has diabetes and is currently on oral medications which he may not be able to take. He is also on sliding scale.  He has hypertension at baseline which is fairly well controlled at this time.   Active Problems:   Diabetes (HCC)   Essential hypertension   CORONARY ATHEROSCLEROSIS NATIVE CORONARY ARTERY   Pleural effusion, right    Plan: I would empirically start him on antibiotics and steroids for the moment and then after he has thoracentesis we can either do chest x-ray or CT to get a better idea about whether he has mass/infiltrate/atelectasis underlying the pleural effusion.  Thanks for allowing me to see him with you    LOS: 1 day   Rashawn Rolon L 04/12/2015, 7:25 AM

## 2015-04-12 NOTE — Progress Notes (Signed)
Initial Nutrition Assessment  INTERVENTION:  If unable to wean 24-48 hr recommend: Initiate Vital HP @ 20 ml/hr via OG tube and add  60 ml Prostat BID    Tube feeding regimen provides 880 kcal, 102 grams of protein, and 401 ml of H2O.    Feeding including propofol (lipids) will provide 1410 kcal (96% energy) and( 89% protein) needs.  Initiate Adult Enteral feeding protocol  NUTRITION DIAGNOSIS:   Inadequate oral intake related to inability to eat as evidenced by NPO status.  GOAL:   Provide needs based on ASPEN/SCCM guidelines   MONITOR:   Vent status, Labs, Weight trends  REASON FOR ASSESSMENT:   Ventilator   ASSESSMENT:  Patient is currently intubated on ventilator support due to acute respiratory failure. He has hx of COPD, CHF, DM and CAD.  MV: 7.6L/min Temp (24hrs), Avg:97.9 F (36.6 C), Min:97 F (36.1 C), Max:99.1 F (37.3 C)  Propofol: 20.6 ml/hr -providing 530 kcal lipids at current rate every 24 hr Labs: glucose 161, pH-7.5  NFPE-deferred until pt is able to participate.    Diet Order:  Diet NPO time specified  Skin:   intact  Last BM:   12/16  Height:   Ht Readings from Last 1 Encounters:  04/12/15 5\' 7"  (1.702 m)    Weight:   Wt Readings from Last 1 Encounters:  04/12/15 188 lb 15 oz (85.7 kg)    Ideal Body Weight:  67 kg  BMI:  Body mass index is 29.58 kg/(m^2).  Estimated Nutritional Needs:   Kcal:  1472   Protein:  115-129 gr  Fluid:  >1500 ml daily   EDUCATION NEEDS:   No education needs identified at this time  Royann ShiversLynn Nhan Qualley MS,RD,CSG,LDN Office: #[phone] Pager: [phone]

## 2015-04-12 NOTE — Progress Notes (Signed)
[facility] PROGRESS NOTE  Miguel Mendoza WUJ:[id number] DOB: 1940/08/28 DOA: 04/11/2015 PCP: Miguel Peri, MD  Assessment/Plan: Acute hypoxemic and hypercarbic respiratory failure -Etiology likely multifactorial: COPD, right greater than left pleural effusion, CHF, possibly pneumonia. -He remains acidotic despite a trial on BiPAP, he has become more lethargic, is still hypoxemic despite O2 of 75%. -We'll proceed with semi-emergent intubation. -We'll request CT scan of the chest once intubated to get a clear picture of his current disease process. -Plan has been discussed with wife Miguel Mendoza who is in agreement. -Appreciate pulmonary consultation.  Right greater than left pleural effusion -Etiology remains unclear to me at present. May represent CHF although may also be related to potential pneumonia. -Thoracentesis was requested, however as per radiology there was not sufficient fluid for drainage.  Potential community-acquired pneumonia -Agree with empiric Rocephin and azithromycin for now pending results of CT scan given the complexity of illness.  COPD with acute exacerbation -Agree with steroids started by Miguel Mendoza, will add scheduled nebulizer treatments.  Diabetes mellitus -Fair control, continue sliding scale.  Acute on chronic diastolic CHF -Per history EF appears to be 50-60%. -We will request 2-D echo. -Agree with IV Lasix, strive for negative fluid balance. -Appreciate cardiology consultation.  History of coronary artery disease -Appears stable at this venue.  Elevated troponin -Mild elevation to 0.05. Likely represents demand ischemia given complexity of overall illness and respiratory failure. -2-D echo requested to evaluate LV function.   Code Status: Full code Family Communication: via telephone with wife Miguel Mendoza  Disposition Plan: Semi-emergent intubation today, keep in intensive care  unit.   Consultants:  Pulmonary  Cardiology   Antibiotics:  Rocephin  Azathioprine   Subjective: Biliary arousable to sternal rub, on BiPAP  Objective: Filed Vitals:   04/12/15 0700 04/12/15 0743 04/12/15 0753 04/12/15 0800  BP: 102/59 102/59  109/60  Pulse: 89 89  87  Temp:   98.1 F (36.7 C)   TempSrc:   Axillary   Resp: Height:      Weight:      SpO2: 93% 93%  93%    Intake/Output Summary (Last 24 hours) at 04/12/15 1005 Last data filed at 04/12/15 0500  Gross per 24 hour  Intake      0 ml  Output    550 ml  Net   -550 ml   Filed Weights   04/11/15 1756 04/12/15 0000 04/12/15 0500  Weight: 90.719 kg (200 lb) 86.2 kg (190 lb 0.6 oz) 85.7 kg (188 lb 15 oz)    Exam:   General:  Lethargic  Cardiovascular: Regular rate and rhythm  Respiratory: Decreased breath sounds bilateral bases  Abdomen: Soft, nontender, nondistended  Extremities: Trace bilateral edema   Neurologic:  Unable to assess given current mental state  Data Reviewed: Basic Metabolic Panel:  Recent Labs Lab 04/11/15 1812 04/12/15 0449  NA 143 142  K 4.6 5.1  CL 99* 100*  CO2 35* 34*  GLUCOSE 114* 161*  BUN 20 22*  CREATININE 1.35* 1.44*  CALCIUM 9.5 9.2   Liver Function Tests:  Recent Labs Lab 04/11/15 1812 04/12/15 0449  AST 15 12*  ALT 8* 7*  ALKPHOS 60 53  BILITOT 1.1 1.0  PROT 7.9 6.9  ALBUMIN 4.0 3.4*   No results for input(s): LIPASE, AMYLASE in the last 168 hours. No results for input(s): AMMONIA in the last 168 hours. CBC:  Recent Labs Lab 04/11/15 1812 04/12/15 0449  WBC 6.5 4.2  NEUTROABS 4.7  --   HGB 12.8* 11.8*  HCT 47.3 44.4  MCV 95.2 94.9  PLT 192 180   Cardiac Enzymes:  Recent Labs Lab 04/11/15 1812  TROPONINI 0.05*   BNP (last 3 results)  Recent Labs  04/11/15 1812 04/12/15 0449  BNP 422.0* 520.0*    ProBNP (last 3 results) No results for input(s): PROBNP in the last 8760 hours.  CBG:  Recent Labs Lab  04/11/15 1814 04/12/15 0730  GLUCAP 112* 140*    No results found for this or any previous visit (from the past 240 hour(s)).   Studies: Ct Head Wo Contrast  04/11/2015  CLINICAL DATA:  Altered mental status.  Evaluate for bleed. EXAM: CT HEAD WITHOUT CONTRAST TECHNIQUE: Contiguous axial images were obtained from the base of the skull through the vertex without intravenous contrast. COMPARISON:  02/07/2015 FINDINGS: Sinuses/Soft tissues: Mild motion degradation. Clear paranasal sinuses and mastoid air cells. Intracranial: Cerebral atrophy. 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 Mild low density in the periventricular white matter likely related to small vessel disease. Vertebral and carotid atherosclerosis. No mass lesion, hemorrhage, hydrocephalus, acute infarct, intra-axial, or extra-axial fluid collection. IMPRESSION: 1.  No acute intracranial abnormality. 2. Motion degradation. 3.  Cerebral atrophy and small vessel ischemic change. 4. 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 Electronically Signed   By: Jeronimo GreavesKyle  Talbot M.D.   On: 04/11/2015 22:28   Dg Chest Portable 1 View  04/11/2015  CLINICAL DATA:  Hypoxia.  Cough. EXAM: PORTABLE CHEST 1 VIEW COMPARISON:  February 07, 2015. FINDINGS: Stable cardiomegaly. Status post coronary artery bypass graft. No pneumothorax is noted. Mild central pulmonary vascular congestion is again noted and stable. Mild left pleural effusion is noted. Moderate right pleural effusion is noted which is increased compared to prior exam, with probable underlying atelectasis or infiltrate. Bony thorax is unremarkable. IMPRESSION: Stable mild left pleural effusion. Significantly increased moderate right pleural effusion with probable underlying atelectasis or infiltrate. Electronically Signed   By: Lupita RaiderJames  Green Jr, M.D.   On: 04/11/2015 18:31    Scheduled Meds: . amLODipine  10 mg Oral Daily  . antiseptic oral rinse  7 mL Mouth Rinse QID   . atorvastatin  20 mg Oral Daily  . azithromycin  500 mg Intravenous Q24H  . cefTRIAXone (ROCEPHIN)  IV  1 g Intravenous Q24H  . chlorhexidine gluconate  15 mL Mouth Rinse BID  . clonazePAM  1 mg Oral BID  . enoxaparin (LOVENOX) injection  40 mg Subcutaneous Q24H  . furosemide  40 mg Intravenous Q12H  . gabapentin  300 mg Oral TID  . glimepiride  2 mg Oral Daily  . insulin aspart  0-15 Units Subcutaneous TID WC  . insulin aspart  0-5 Units Subcutaneous QHS  . ipratropium-albuterol  3 mL Nebulization Q4H  . isosorbide mononitrate  30 mg Oral Daily  . lisinopril  2.5 mg Oral Daily  . metFORMIN  1,000 mg Oral BID WC  . methylPREDNISolone (SOLU-MEDROL) injection  40 mg Intravenous Q12H  . metoprolol  100 mg Oral q morning - 10a  . pantoprazole  40 mg Oral Daily  . pantoprazole (PROTONIX) IV  40 mg Intravenous Daily  . sodium chloride  3 mL Intravenous Q12H  . tiZANidine  4 mg Oral BID  . traZODone  100 mg Oral QHS   Continuous Infusions: . propofol (DIPRIVAN) infusion      Active Problems:   Diabetes (HCC)   Essential hypertension   CORONARY ATHEROSCLEROSIS NATIVE  CORONARY ARTERY   Pleural effusion, right    Critical care time spent: 65 minutes. Greater than 50% of this time was spent in direct contact with the patient coordinating care.    Chaya Jan  [facility] Pager [phone]  If 7PM-7AM, please contact night-coverage at [email or web address], password [facility] 04/12/2015, 10:05 AM  LOS: 1 day

## 2015-04-12 NOTE — Care Management Important Message (Signed)
Important Message  Patient Details  Name: Miguel Mendoza MRN: [id number] Date of Birth: [birth year]   Medicare Important Message Given:  Yes    Cheryl FlashBlackwell, Jonice Cerra Crowder, RN 04/12/2015, 3:06 PM

## 2015-04-12 NOTE — Anesthesia Procedure Notes (Signed)
Procedure Name: Intubation Date/Time: 04/12/2015 10:20 AM Performed by: Despina HiddenIDACAVAGE, Henrry Feil J Pre-anesthesia Checklist: Patient identified, Emergency Drugs available, Suction available and Patient being monitored Patient Re-evaluated:Patient Re-evaluated prior to inductionOxygen Delivery Method: Ambu bag Preoxygenation: Pre-oxygenation with 100% oxygen Intubation Type: IV induction, Rapid sequence and Cricoid Pressure applied Laryngoscope Size: Mac and 3 Grade View: Grade I Tube type: Oral Tube size: 7.5 mm Number of attempts: 1 Airway Equipment and Method: Stylet Placement Confirmation: ETT inserted through vocal cords under direct vision,  positive ETCO2,  breath sounds checked- equal and bilateral and CO2 detector Secured at: 22 cm Tube secured with: Tape Dental Injury: Teeth and Oropharynx as per pre-operative assessment

## 2015-04-12 NOTE — Progress Notes (Signed)
PT ECHO COMPLETED. PHONE CONSENT OBTAINED FROM WIFE FOR PICC LINE.

## 2015-04-13 LAB — CBC
HEMATOCRIT: 39.1 % (ref 39.0–52.0)
HEMOGLOBIN: 11 g/dL — AB (ref 13.0–17.0)
MCH: 25.3 pg — ABNORMAL LOW (ref 26.0–34.0)
MCHC: 28.1 g/dL — AB (ref 30.0–36.0)
MCV: 90.1 fL (ref 78.0–100.0)
Platelets: 188 10*3/uL (ref 150–400)
RBC: 4.34 MIL/uL (ref 4.22–5.81)
RDW: 19 % — ABNORMAL HIGH (ref 11.5–15.5)
WBC: 7.3 10*3/uL (ref 4.0–10.5)

## 2015-04-13 LAB — GLUCOSE, CAPILLARY
GLUCOSE-CAPILLARY: 106 mg/dL — AB (ref 65–99)
GLUCOSE-CAPILLARY: 150 mg/dL — AB (ref 65–99)
GLUCOSE-CAPILLARY: 104 mg/dL — AB (ref 65–99)
Glucose-Capillary: 121 mg/dL — ABNORMAL HIGH (ref 65–99)
Glucose-Capillary: 137 mg/dL — ABNORMAL HIGH (ref 65–99)
Glucose-Capillary: 96 mg/dL (ref 65–99)

## 2015-04-13 LAB — BASIC METABOLIC PANEL
ANION GAP: 10 (ref 5–15)
BUN: 34 mg/dL — ABNORMAL HIGH (ref 6–20)
CHLORIDE: 100 mmol/L — AB (ref 101–111)
CO2: 31 mmol/L (ref 22–32)
Calcium: 8.8 mg/dL — ABNORMAL LOW (ref 8.9–10.3)
Creatinine, Ser: 1.65 mg/dL — ABNORMAL HIGH (ref 0.61–1.24)
GFR calc Af Amer: 46 mL/min — ABNORMAL LOW (ref >60)
GFR, EST NON AFRICAN AMERICAN: 40 mL/min — AB (ref >60)
GLUCOSE: 120 mg/dL — AB (ref 65–99)
POTASSIUM: 4.6 mmol/L (ref 3.5–5.1)
Sodium: 141 mmol/L (ref 135–145)

## 2015-04-13 MED ORDER — IPRATROPIUM BROMIDE 0.02 % IN SOLN: 0.5000 mg | Freq: Four times a day (QID) | RESPIRATORY_TRACT | Status: DC

## 2015-04-13 MED ORDER — FUROSEMIDE 10 MG/ML IJ SOLN
40.0000 mg | Freq: Every day | INTRAMUSCULAR | Status: DC
  Administered 2015-04-13 – 2015-04-16 (×4): 40 mg via INTRAVENOUS
  Filled 2015-04-13 (×4): qty 4

## 2015-04-13 MED ORDER — ALBUTEROL SULFATE (2.5 MG/3ML) 0.083% IN NEBU: 2.5000 mg | INHALATION_SOLUTION | Freq: Four times a day (QID) | RESPIRATORY_TRACT | Status: DC

## 2015-04-13 MED ORDER — METOPROLOL TARTRATE 1 MG/ML IV SOLN
5.0000 mg | Freq: Four times a day (QID) | INTRAVENOUS | Status: DC
  Administered 2015-04-13 – 2015-04-16 (×11): 5 mg via INTRAVENOUS
  Filled 2015-04-13 (×12): qty 5

## 2015-04-13 MED ORDER — IPRATROPIUM-ALBUTEROL 0.5-2.5 (3) MG/3ML IN SOLN
3.0000 mL | Freq: Four times a day (QID) | RESPIRATORY_TRACT | Status: DC
  Administered 2015-04-13 – 2015-04-26 (×53): 3 mL via RESPIRATORY_TRACT
  Filled 2015-04-13 (×53): qty 3

## 2015-04-13 NOTE — Progress Notes (Signed)
Subjective: He required intubation yesterday as anticipated. He remains intubated on the ventilator on 60% oxygen.  Objective: Vital signs in last 24 hours: Temp:  [98.2 F (36.8 C)-99.7 F (37.6 C)] 99.1 F (37.3 C) (12/17 0823) Pulse Rate:  [71-91] 81 (12/17 0600) Resp:  [9-25] 14 (12/17 0600) BP: (86-118)/(44-80) 86/69 mmHg (12/17 0600) SpO2:  [89 %-98 %] 95 % (12/17 0719) FiO2 (%):  [60 %-80 %] 60 % (12/17 0719) Weight:  [86.1 kg (189 lb 13.1 oz)] 86.1 kg (189 lb 13.1 oz) (12/17 0508) Weight change: -4.619 kg (-10 lb 2.9 oz) Last BM Date: 04/12/15  Intake/Output from previous day: 12/16 0701 - 12/17 0700 In: 819.3 [I.V.:519.3; IV Piggyback:300] Out: 475 [Urine:475]  PHYSICAL EXAM General appearance: Intubated and sedated on mechanical ventilation Resp: rhonchi bilaterally Cardio: regular rate and rhythm, S1, S2 normal, no murmur, click, rub or gallop GI: soft, non-tender; bowel sounds normal; no masses,  no organomegaly Extremities: extremities normal, atraumatic, no cyanosis or edema  Lab Results:  Results for orders placed or performed during the hospital encounter of 04/11/15 (from the past 48 hour(s))  MRSA PCR Screening     Status: None   Collection Time: 04/11/15 11:42 AM  Result Value Ref Range   MRSA by PCR NEGATIVE NEGATIVE    Comment:        The GeneXpert MRSA Assay (FDA approved for NASAL specimens only), is one component of a comprehensive MRSA colonization surveillance program. It is not intended to diagnose MRSA infection nor to guide or monitor treatment for MRSA infections.   CBC with Differential     Status: Abnormal   Collection Time: 04/11/15  6:12 PM  Result Value Ref Range   WBC 6.5 4.0 - 10.5 K/uL   RBC 4.97 4.22 - 5.81 MIL/uL   Hemoglobin 12.8 (L) 13.0 - 17.0 g/dL   HCT 47.3 39.0 - 52.0 %   MCV 95.2 78.0 - 100.0 fL   MCH 25.8 (L) 26.0 - 34.0 pg   MCHC 27.1 (L) 30.0 - 36.0 g/dL   RDW 19.3 (H) 11.5 - 15.5 %   Platelets 192 150 - 400  K/uL   Neutrophils Relative % 71 %   Neutro Abs 4.7 1.7 - 7.7 K/uL   Lymphocytes Relative 17 %   Lymphs Abs 1.1 0.7 - 4.0 K/uL   Monocytes Relative 10 %   Monocytes Absolute 0.6 0.1 - 1.0 K/uL   Eosinophils Relative 1 %   Eosinophils Absolute 0.1 0.0 - 0.7 K/uL   Basophils Relative 1 %   Basophils Absolute 0.0 0.0 - 0.1 K/uL  Comprehensive metabolic panel     Status: Abnormal   Collection Time: 04/11/15  6:12 PM  Result Value Ref Range   Sodium 143 135 - 145 mmol/L   Potassium 4.6 3.5 - 5.1 mmol/L   Chloride 99 (L) 101 - 111 mmol/L   CO2 35 (H) 22 - 32 mmol/L   Glucose, Bld 114 (H) 65 - 99 mg/dL   BUN 20 6 - 20 mg/dL   Creatinine, Ser 1.35 (H) 0.61 - 1.24 mg/dL   Calcium 9.5 8.9 - 10.3 mg/dL   Total Protein 7.9 6.5 - 8.1 g/dL   Albumin 4.0 3.5 - 5.0 g/dL   AST 15 15 - 41 U/L   ALT 8 (L) 17 - 63 U/L   Alkaline Phosphatase 60 38 - 126 U/L   Total Bilirubin 1.1 0.3 - 1.2 mg/dL   GFR calc non Af Amer 50 (L) >60  mL/min   GFR calc Af Amer 59 (L) >60 mL/min    Comment: (NOTE) The eGFR has been calculated using the CKD EPI equation. This calculation has not been validated in all clinical situations. eGFR's persistently <60 mL/min signify possible Chronic Kidney Disease.    Anion gap 9 5 - 15  Brain natriuretic peptide     Status: Abnormal   Collection Time: 04/11/15  6:12 PM  Result Value Ref Range   B Natriuretic Peptide 422.0 (H) 0.0 - 100.0 pg/mL  Troponin I     Status: Abnormal   Collection Time: 04/11/15  6:12 PM  Result Value Ref Range   Troponin I 0.05 (H) <0.031 ng/mL    Comment:        PERSISTENTLY INCREASED TROPONIN VALUES IN THE RANGE OF 0.04-0.49 ng/mL CAN BE SEEN IN:       -UNSTABLE ANGINA       -CONGESTIVE HEART FAILURE       -MYOCARDITIS       -CHEST TRAUMA       -ARRYHTHMIAS       -LATE PRESENTING MYOCARDIAL INFARCTION       -COPD   CLINICAL FOLLOW-UP RECOMMENDED.   TSH     Status: None   Collection Time: 04/11/15  6:12 PM  Result Value Ref Range    TSH 1.082 0.350 - 4.500 uIU/mL  CBG monitoring, ED     Status: Abnormal   Collection Time: 04/11/15  6:14 PM  Result Value Ref Range   Glucose-Capillary 112 (H) 65 - 99 mg/dL  Blood gas, arterial     Status: Abnormal   Collection Time: 04/11/15  6:35 PM  Result Value Ref Range   FIO2 1.00    Delivery systems OXYGEN MASK    pH, Arterial 7.237 (L) 7.350 - 7.450   pCO2 arterial 85.1 (HH) 35.0 - 45.0 mmHg    Comment:  REBECCA MINER,RN AT 1844, BY WENDY VIA,RRT,RCP ON 04/11/2015   pO2, Arterial 108 (H) 80.0 - 100.0 mmHg   Bicarbonate 35.0 (H) 20.0 - 24.0 mEq/L   Acid-Base Excess 5.3 (H) 0.0 - 2.0 mmol/L   O2 Saturation 96.5 %   Patient temperature 37.0    Collection site RIGHT RADIAL    Drawn by [phone]    Sample type ARTERIAL DRAW    Allens test (pass/fail) PASS PASS  Lactic acid, plasma     Status: None   Collection Time: 04/11/15  6:38 PM  Result Value Ref Range   Lactic Acid, Venous 1.3 0.5 - 2.0 mmol/L  Blood gas, arterial     Status: Abnormal   Collection Time: 04/11/15  8:54 PM  Result Value Ref Range   FIO2 0.75    Delivery systems BILEVEL POSITIVE AIRWAY PRESSURE    LHR 12 resp/min   Inspiratory PAP 14    Expiratory PAP 7    pH, Arterial 7.208 (L) 7.350 - 7.450   pCO2 arterial 90.3 (HH) 35.0 - 45.0 mmHg    Comment: CRITICAL RESULT CALLED TO, READ BACK BY AND VERIFIED WITH: G.PRUITT,RN BY B.STOPHEL,RRT,RCP ON 04/11/15 AT 21:04.    pO2, Arterial 79.7 (L) 80.0 - 100.0 mmHg   Bicarbonate 28.0 (H) 20.0 - 24.0 mEq/L   Acid-Base Excess 6.9 (H) 0.0 - 2.0 mmol/L   O2 Saturation 91.7 %   Patient temperature 37.0    Collection site RIGHT RADIAL    Drawn by [id number]    Sample type ARTERIAL DRAW    Allens test (pass/fail) PASS PASS  Brain natriuretic peptide     Status: Abnormal   Collection Time: 04/12/15  4:49 AM  Result Value Ref Range   B Natriuretic Peptide 520.0 (H) 0.0 - 100.0 pg/mL  Comprehensive metabolic panel     Status: Abnormal   Collection Time: 04/12/15  4:49 AM   Result Value Ref Range   Sodium 142 135 - 145 mmol/L   Potassium 5.1 3.5 - 5.1 mmol/L   Chloride 100 (L) 101 - 111 mmol/L   CO2 34 (H) 22 - 32 mmol/L   Glucose, Bld 161 (H) 65 - 99 mg/dL   BUN 22 (H) 6 - 20 mg/dL   Creatinine, Ser 1.44 (H) 0.61 - 1.24 mg/dL   Calcium 9.2 8.9 - 10.3 mg/dL   Total Protein 6.9 6.5 - 8.1 g/dL   Albumin 3.4 (L) 3.5 - 5.0 g/dL   AST 12 (L) 15 - 41 U/L   ALT 7 (L) 17 - 63 U/L   Alkaline Phosphatase 53 38 - 126 U/L   Total Bilirubin 1.0 0.3 - 1.2 mg/dL   GFR calc non Af Amer 47 (L) >60 mL/min   GFR calc Af Amer 54 (L) >60 mL/min    Comment: (NOTE) The eGFR has been calculated using the CKD EPI equation. This calculation has not been validated in all clinical situations. eGFR's persistently <60 mL/min signify possible Chronic Kidney Disease.    Anion gap 8 5 - 15  CBC     Status: Abnormal   Collection Time: 04/12/15  4:49 AM  Result Value Ref Range   WBC 4.2 4.0 - 10.5 K/uL   RBC 4.68 4.22 - 5.81 MIL/uL   Hemoglobin 11.8 (L) 13.0 - 17.0 g/dL   HCT 44.4 39.0 - 52.0 %   MCV 94.9 78.0 - 100.0 fL   MCH 25.2 (L) 26.0 - 34.0 pg   MCHC 26.6 (L) 30.0 - 36.0 g/dL   RDW 19.4 (H) 11.5 - 15.5 %   Platelets 180 150 - 400 K/uL  Triglycerides     Status: None   Collection Time: 04/12/15  4:49 AM  Result Value Ref Range   Triglycerides 72 <150 mg/dL  Blood gas, arterial     Status: Abnormal   Collection Time: 04/12/15  5:02 AM  Result Value Ref Range   FIO2 75.00    Delivery systems BILEVEL POSITIVE AIRWAY PRESSURE    LHR 10 resp/min   Inspiratory PAP 16    Expiratory PAP 5    pH, Arterial 7.272 (L) 7.350 - 7.450   pCO2 arterial 77.0 (HH) 35.0 - 45.0 mmHg    Comment: CRITICAL RESULT CALLED TO, READ BACK BY AND VERIFIED WITH:  ROBBIE W. RN BY KNICK,K RRT RCP ON 04/12/2015 AT 0509    pO2, Arterial 68.4 (L) 80.0 - 100.0 mmHg   Bicarbonate 29.3 (H) 20.0 - 24.0 mEq/L   TCO2 14.7 0 - 100 mmol/L   Acid-Base Excess 7.7 (H) 0.0 - 2.0 mmol/L   O2 Saturation  90.5 %   Patient temperature 37.0    Collection site RIGHT RADIAL    Drawn by [id number]    Sample type ARTERIAL    Allens test (pass/fail) PASS PASS  Lactate dehydrogenase     Status: None   Collection Time: 04/12/15  5:20 AM  Result Value Ref Range   LDH 119 98 - 192 U/L  Glucose, capillary     Status: Abnormal   Collection Time: 04/12/15  7:30 AM  Result Value Ref Range  Glucose-Capillary 140 (H) 65 - 99 mg/dL  Glucose, capillary     Status: Abnormal   Collection Time: 04/12/15 11:51 AM  Result Value Ref Range   Glucose-Capillary 151 (H) 65 - 99 mg/dL  Blood gas, arterial     Status: Abnormal   Collection Time: 04/12/15  2:50 PM  Result Value Ref Range   FIO2 0.70    Delivery systems VENTILATOR    Mode PRESSURE REGULATED VOLUME CONTROL    VT 530 mL   LHR 14 resp/min   Peep/cpap 5.0 cm H20   pH, Arterial 7.533 (H) 7.350 - 7.450   pCO2 arterial 38.6 35.0 - 45.0 mmHg   pO2, Arterial 98.5 80.0 - 100.0 mmHg   Bicarbonate 32.9 (H) 20.0 - 24.0 mEq/L   Acid-Base Excess 9.0 (H) 0.0 - 2.0 mmol/L   O2 Saturation 98.5 %   Patient temperature 37.0    Collection site LEFT BRACHIAL    Drawn by [id number]    Sample type ARTERIAL DRAW    Allens test (pass/fail) PASS PASS  Glucose, capillary     Status: Abnormal   Collection Time: 04/12/15  3:53 PM  Result Value Ref Range   Glucose-Capillary 102 (H) 65 - 99 mg/dL   Comment 1 Notify RN   Glucose, capillary     Status: None   Collection Time: 04/12/15  7:45 PM  Result Value Ref Range   Glucose-Capillary 68 65 - 99 mg/dL   Comment 1 Notify RN    Comment 2 Document in Chart   Glucose, capillary     Status: Abnormal   Collection Time: 04/12/15 11:41 PM  Result Value Ref Range   Glucose-Capillary 101 (H) 65 - 99 mg/dL   Comment 1 Document in Chart   Glucose, capillary     Status: Abnormal   Collection Time: 04/13/15  4:18 AM  Result Value Ref Range   Glucose-Capillary 121 (H) 65 - 99 mg/dL  Basic metabolic panel     Status: Abnormal    Collection Time: 04/13/15  4:20 AM  Result Value Ref Range   Sodium 141 135 - 145 mmol/L   Potassium 4.6 3.5 - 5.1 mmol/L   Chloride 100 (L) 101 - 111 mmol/L   CO2 31 22 - 32 mmol/L   Glucose, Bld 120 (H) 65 - 99 mg/dL   BUN 34 (H) 6 - 20 mg/dL   Creatinine, Ser 1.65 (H) 0.61 - 1.24 mg/dL   Calcium 8.8 (L) 8.9 - 10.3 mg/dL   GFR calc non Af Amer 40 (L) >60 mL/min   GFR calc Af Amer 46 (L) >60 mL/min    Comment: (NOTE) The eGFR has been calculated using the CKD EPI equation. This calculation has not been validated in all clinical situations. eGFR's persistently <60 mL/min signify possible Chronic Kidney Disease.    Anion gap 10 5 - 15  CBC     Status: Abnormal   Collection Time: 04/13/15  4:20 AM  Result Value Ref Range   WBC 7.3 4.0 - 10.5 K/uL   RBC 4.34 4.22 - 5.81 MIL/uL   Hemoglobin 11.0 (L) 13.0 - 17.0 g/dL   HCT 39.1 39.0 - 52.0 %   MCV 90.1 78.0 - 100.0 fL   MCH 25.3 (L) 26.0 - 34.0 pg   MCHC 28.1 (L) 30.0 - 36.0 g/dL   RDW 19.0 (H) 11.5 - 15.5 %   Platelets 188 150 - 400 K/uL  Glucose, capillary     Status: Abnormal   Collection  Time: 04/13/15  7:51 AM  Result Value Ref Range   Glucose-Capillary 106 (H) 65 - 99 mg/dL   Comment 1 Notify RN    Comment 2 Document in Chart     ABGS  Recent Labs  04/12/15 0502 04/12/15 1450  PHART 7.272* 7.533*  PO2ART 68.4* 98.5  TCO2 14.7  --   HCO3 29.3* 32.9*   CULTURES Recent Results (from the past 240 hour(s))  MRSA PCR Screening     Status: None   Collection Time: 04/11/15 11:42 AM  Result Value Ref Range Status   MRSA by PCR NEGATIVE NEGATIVE Final    Comment:        The GeneXpert MRSA Assay (FDA approved for NASAL specimens only), is one component of a comprehensive MRSA colonization surveillance program. It is not intended to diagnose MRSA infection nor to guide or monitor treatment for MRSA infections.    Studies/Results: Ct Head Wo Contrast  04/11/2015  CLINICAL DATA:  Altered mental status.   Evaluate for bleed. EXAM: CT HEAD WITHOUT CONTRAST TECHNIQUE: Contiguous axial images were obtained from the base of the skull through the vertex without intravenous contrast. COMPARISON:  02/07/2015 FINDINGS: Sinuses/Soft tissues: Mild motion degradation. Clear paranasal sinuses and mastoid air cells. Intracranial: Cerebral atrophy. 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 Mild low density in the periventricular white matter likely related to small vessel disease. Vertebral and carotid atherosclerosis. No mass lesion, hemorrhage, hydrocephalus, acute infarct, intra-axial, or extra-axial fluid collection. IMPRESSION: 1.  No acute intracranial abnormality. 2. Motion degradation. 3.  Cerebral atrophy and small vessel ischemic change. 4. 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 Electronically Signed   By: Abigail Miyamoto M.D.   On: 04/11/2015 22:28   Ct Chest Wo Contrast  04/12/2015  CLINICAL DATA:  Respiratory failure. EXAM: CT CHEST WITHOUT CONTRAST TECHNIQUE: Multidetector CT imaging of the chest was performed following the standard protocol without IV contrast. COMPARISON:  02/07/2015 FINDINGS: Mediastinum: Previous median sternotomy and CABG procedure. Moderate cardiac enlargement. Aortic atherosclerosis noted. The trachea is patent and midline. The ET tube tip is above the carina. OG tube appears scratch set there is a nasogastric tube within a normal appearing esophagus. Prominent mediastinal nodes are identified. Index right paratracheal lymph node measures 1.4 cm, image 23/ series 2. Unchanged from previous exam. No axillary or supraclavicular adenopathy. Lungs/Pleura: Moderate to large right pleural effusion identified. There is airspace consolidation involving the entire right lower lobe. This appears progressive when compared with previous exam. Small left pleural effusion with posterior medial consolidation is identified. Upper Abdomen: The adrenal glands are normal.  Normal appearance of the liver and spleen. The visualized portions of the pancreas appear normal. Musculoskeletal: Spondylosis noted within the thoracic spine. No aggressive lytic or sclerotic bone lesions. IMPRESSION: 1. Interval increase in volume of right pleural effusion with complete consolidation of the right lower lobe. 2. New posterior medial consolidation of the left lower lobe. 3. Cardiac enlargement and aortic atherosclerosis. 4. Enlarged right paratracheal lymph node. Similar to previous exam. Electronically Signed   By: Kerby Moors M.D.   On: 04/12/2015 16:31   [place] Chest  04/12/2015  CLINICAL DATA:  Pleural effusion. EXAM: CHEST ULTRASOUND COMPARISON:  Chest x-ray 04/11/2015. FINDINGS: Small right pleural effusion is noted. Patient could not tolerate positioning for evaluation. Given the small size of the effusion and difficulty in positioning patient, thoracentesis not performed. IMPRESSION: Small pleural effusion.  Thoracentesis not performed. Electronically Signed   By: Marcello Moores  Register  On: 04/12/2015 10:29   Dg Chest Port 1 View  04/12/2015  CLINICAL DATA:  Endotracheal tube placement. EXAM: PORTABLE CHEST 1 VIEW COMPARISON:  April 11, 2015. FINDINGS: Stable cardiomegaly. Status post coronary artery bypass graft. Patient is rotated to the right. Mild central pulmonary vascular congestion is noted. Moderate right pleural effusion is again noted with probable underlying atelectasis or infiltrate. No pneumothorax is noted. Endotracheal tube is seen projected over tracheal air shadow with distal tip at least 2 cm above the carina. Nasogastric tube is seen entering the stomach. IMPRESSION: Endotracheal tube appears to be in grossly good position. Stable cardiomegaly and central pulmonary vascular congestion is noted. Stable moderate right pleural effusion with probable underlying atelectasis or infiltrate. Electronically Signed   By: Marijo Conception, M.D.   On: 04/12/2015 11:04   Dg  Chest Portable 1 View  04/11/2015  CLINICAL DATA:  Hypoxia.  Cough. EXAM: PORTABLE CHEST 1 VIEW COMPARISON:  February 07, 2015. FINDINGS: Stable cardiomegaly. Status post coronary artery bypass graft. No pneumothorax is noted. Mild central pulmonary vascular congestion is again noted and stable. Mild left pleural effusion is noted. Moderate right pleural effusion is noted which is increased compared to prior exam, with probable underlying atelectasis or infiltrate. Bony thorax is unremarkable. IMPRESSION: Stable mild left pleural effusion. Significantly increased moderate right pleural effusion with probable underlying atelectasis or infiltrate. Electronically Signed   By: Marijo Conception, M.D.   On: 04/11/2015 18:31    Medications:  Prior to Admission:  Prescriptions prior to admission  Medication Sig Dispense Refill Last Dose  . amLODipine (NORVASC) 10 MG tablet Take 10 mg by mouth daily.   04/11/2015 at Unknown time  . atorvastatin (LIPITOR) 20 MG tablet Take 20 mg by mouth daily.   04/11/2015 at Unknown time  . clonazePAM (KLONOPIN) 1 MG tablet Take 1 mg by mouth 2 (two) times daily.   04/11/2015 at Unknown time  . clopidogrel (PLAVIX) 75 MG tablet Take 75 mg by mouth daily.   04/11/2015 at Unknown time  . esomeprazole (NEXIUM) 40 MG capsule Take 40 mg by mouth daily before breakfast.   04/11/2015 at Unknown time  . furosemide (LASIX) 40 MG tablet Take 0.5-1 tablets (20-40 mg total) by mouth every other day. Alternated a half pill with whole pill every other day (Patient taking differently: Take 40 mg by mouth daily. ) 115 tablet 3 unknown  . glimepiride (AMARYL) 2 MG tablet Take 2 mg by mouth daily.    04/11/2015 at Unknown time  . isosorbide mononitrate (IMDUR) 30 MG 24 hr tablet Take 30 mg by mouth daily.   04/11/2015 at Unknown time  . lisinopril (PRINIVIL,ZESTRIL) 2.5 MG tablet Take 2.5 mg by mouth daily.   04/11/2015 at Unknown time  . metFORMIN (GLUCOPHAGE) 500 MG tablet Take 1,000 mg by  mouth 2 (two) times daily with a meal.   04/11/2015 at Unknown time  . metoprolol (LOPRESSOR) 100 MG tablet Take 100 mg by mouth every morning. & 1/2 by mouth in evening   04/11/2015 at Unknown time  . nitroGLYCERIN (NITROSTAT) 0.4 MG SL tablet Place 0.4 mg under the tongue every 5 (five) minutes as needed.   unknown  . oxyCODONE-acetaminophen (PERCOCET) 7.5-325 MG tablet Take 1 tablet by mouth every 4 (four) hours as needed for severe pain (4-6 hours).   unknown  . tiZANidine (ZANAFLEX) 4 MG tablet Take 4 mg by mouth 2 (two) times daily.    04/11/2015 at Unknown time  .  traZODone (DESYREL) 100 MG tablet Take 100 mg by mouth at bedtime.   04/10/2015 at Unknown time  . gabapentin (NEURONTIN) 300 MG capsule Take 300 mg by mouth 3 (three) times daily.   Taking   Scheduled: . amLODipine  10 mg Oral Daily  . antiseptic oral rinse  7 mL Mouth Rinse QID  . atorvastatin  20 mg Oral Daily  . azithromycin  500 mg Intravenous Q24H  . cefTRIAXone (ROCEPHIN)  IV  1 g Intravenous Q24H  . chlorhexidine gluconate  15 mL Mouth Rinse BID  . clonazePAM  1 mg Oral BID  . enoxaparin (LOVENOX) injection  40 mg Subcutaneous Q24H  . furosemide  40 mg Oral Daily  . gabapentin  300 mg Oral TID  . glimepiride  2 mg Oral Daily  . insulin aspart  0-15 Units Subcutaneous 6 times per day  . ipratropium-albuterol  3 mL Nebulization Q4H  . isosorbide mononitrate  30 mg Oral Daily  . lisinopril  2.5 mg Oral Daily  . metFORMIN  1,000 mg Oral BID WC  . methylPREDNISolone (SOLU-MEDROL) injection  40 mg Intravenous Q12H  . metoprolol  100 mg Oral q morning - 10a  . pantoprazole  40 mg Oral Daily  . pantoprazole (PROTONIX) IV  40 mg Intravenous Daily  . sodium chloride  3 mL Intravenous Q12H  . tiZANidine  4 mg Oral BID  . traZODone  100 mg Oral QHS   Continuous: . propofol (DIPRIVAN) infusion 50 mcg/kg/min (04/13/15 0600)   UUV:OZDGUYQI (SUBLIMAZE) injection, fentaNYL (SUBLIMAZE) injection, nitroGLYCERIN, ondansetron  **OR** ondansetron (ZOFRAN) IV, oxyCODONE-acetaminophen  Assesment: He was admitted with respiratory failure and failed BiPAP. He has pneumonia on CT and although it was not felt that he had a large pleural effusion when this was checked by ultrasound it looks like on his CT that he does have a pretty large pleural effusion. He has COPD at baseline. He is still on 60% oxygen. Principal Problem:   Ventilator dependence ([place]) Active Problems:   Diabetes ([place])   TOBACCO USER   Essential hypertension   CORONARY ATHEROSCLEROSIS NATIVE CORONARY ARTERY   Pleural effusion, right   Acute respiratory failure with hypercapnia ([facility])   COPD with acute exacerbation ([facility])   Diastolic CHF, acute on chronic (HCC)   CAP (community acquired pneumonia)    Plan: Continue antibiotics and steroids ventilator support. He is not able to come off the ventilator today.    LOS: 2 days   Ruairi Stutsman L 04/13/2015, 8:54 AM

## 2015-04-13 NOTE — Progress Notes (Addendum)
[facility] PROGRESS NOTE  LYAL HUSTED WUJ:[id number] DOB: 26-Jan-1941 DOA: 04/11/2015 PCP: [name], MD  Assessment/Plan: Acute hypoxemic and hypercarbic respiratory failure -Etiology likely multifactorial: COPD, right greater than left pleural effusion, CHF,  pneumonia. -Was intubated 12/16 due to progression of respiratory failure on BiPAP. -CT scan of the chest performed 12/16 show: Interval increase in volume of right pleural effusion with complete consolidation of the right lower lobe. New posterior medial consolidation of the left lower lobe.  -Please see below for further details. -Appreciate pulmonary consultation.  Right greater than left pleural effusion -Etiology remains unclear to me at present. May represent CHF although may also be related to potential pneumonia. -Thoracentesis was requested, however as per radiology there was not sufficient fluid for drainage. Effusion however appears quite large on CT chest, may be of benefit to try and reattempt thoracentesis at a later date.  Potential community-acquired pneumonia -Agree with Rocephin and azithromycin. -We'll request sputum and blood cultures.  COPD with acute exacerbation -Agree with steroids started by Dr. Juanetta Gosling, will add scheduled nebulizer treatments.  Diabetes mellitus -Fair control, continue sliding scale.  Acute on chronic diastolic CHF -Per history EF appears to be 50-60%. -We will request 2-D echo. -Agree with IV Lasix, strive for negative fluid balance. -Appreciate cardiology consultation.  History of coronary artery disease -Appears stable at this venue.  Elevated troponin -Mild elevation to 0.05. Likely represents demand ischemia given complexity of overall illness and respiratory failure. -2-D echo: Ejection fraction of 55-60%, normal wall motion, grade 1 diastolic dysfunction. -No further workup planned for at present.  Acute renal failure -Related to acute illness,  ATN. -Monitor renal function. -May worsen with diuresis given acute CHF.   Code Status: Full code Family Communication: Patient only Disposition Plan: Semi-emergent intubation today, keep in intensive care unit.   Consultants:  Pulmonary  Cardiology   Antibiotics:  Rocephin  Azathioprine   Subjective: Biliary arousable to sternal rub, on BiPAP  Objective: Filed Vitals:   04/13/15 0823 04/13/15 0830 04/13/15 0845 04/13/15 0900  BP:  101/56 104/61 102/59  Pulse:  69 72 71  Temp: 99.1 F (37.3 C)     TempSrc: Axillary     Resp:  Height:      Weight:      SpO2:  95% 95% 95%    Intake/Output Summary (Last 24 hours) at 04/13/15 0941 Last data filed at 04/13/15 0902  Gross per 24 hour  Intake 1148.2 ml  Output    475 ml  Net  673.2 ml   Filed Weights   04/12/15 0000 04/12/15 0500 04/13/15 0508  Weight: 86.2 kg (190 lb 0.6 oz) 85.7 kg (188 lb 15 oz) 86.1 kg (189 lb 13.1 oz)    Exam:   General:  Lethargic  Cardiovascular: Regular rate and rhythm  Respiratory: Decreased breath sounds bilateral bases  Abdomen: Soft, nontender, nondistended  Extremities: Trace bilateral edema   Neurologic:  Unable to assess given current mental state  Data Reviewed: Basic Metabolic Panel:  Recent Labs Lab 04/11/15 1812 04/12/15 0449 04/13/15 0420  NA 143 142 141  K 4.6 5.1 4.6  CL 99* 100* 100*  CO2 35* 34* 31  GLUCOSE 114* 161* 120*  BUN 20 22* 34*  CREATININE 1.35* 1.44* 1.65*  CALCIUM 9.5 9.2 8.8*   Liver Function Tests:  Recent Labs Lab 04/11/15 1812 04/12/15 0449  AST 15 12*  ALT 8* 7*  ALKPHOS 60 53  BILITOT 1.1 1.0  PROT 7.9 6.9  ALBUMIN 4.0 3.4*   No results for input(s): LIPASE, AMYLASE in the last 168 hours. No results for input(s): AMMONIA in the last 168 hours. CBC:  Recent Labs Lab 04/11/15 1812 04/12/15 0449 04/13/15 0420  WBC 6.5 4.2 7.3  NEUTROABS 4.7  --   --   HGB 12.8* 11.8* 11.0*  HCT 47.3 44.4 39.1  MCV  95.2 94.9 90.1  PLT 192 180 188   Cardiac Enzymes:  Recent Labs Lab 04/11/15 1812  TROPONINI 0.05*   BNP (last 3 results)  Recent Labs  04/11/15 1812 04/12/15 0449  BNP 422.0* 520.0*    ProBNP (last 3 results) No results for input(s): PROBNP in the last 8760 hours.  CBG:  Recent Labs Lab 04/12/15 1553 04/12/15 1945 04/12/15 2341 04/13/15 0418 04/13/15 0751  GLUCAP 102* 68 101* 121* 106*    Recent Results (from the past 240 hour(s))  MRSA PCR Screening     Status: None   Collection Time: 04/11/15 11:42 AM  Result Value Ref Range Status   MRSA by PCR NEGATIVE NEGATIVE Final    Comment:        The GeneXpert MRSA Assay (FDA approved for NASAL specimens only), is one component of a comprehensive MRSA colonization surveillance program. It is not intended to diagnose MRSA infection nor to guide or monitor treatment for MRSA infections.      Studies: Ct Head Wo Contrast  04/11/2015  CLINICAL DATA:  Altered mental status.  Evaluate for bleed. EXAM: CT HEAD WITHOUT CONTRAST TECHNIQUE: Contiguous axial images were obtained from the base of the skull through the vertex without intravenous contrast. COMPARISON:  02/07/2015 FINDINGS: Sinuses/Soft tissues: Mild motion degradation. Clear paranasal sinuses and mastoid air cells. Intracranial: Cerebral atrophy. 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 Mild low density in the periventricular white matter likely related to small vessel disease. Vertebral and carotid atherosclerosis. No mass lesion, hemorrhage, hydrocephalus, acute infarct, intra-axial, or extra-axial fluid collection. IMPRESSION: 1.  No acute intracranial abnormality. 2. Motion degradation. 3.  Cerebral atrophy and small vessel ischemic change. 4. 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 Electronically Signed   By: Jeronimo Greaves M.D.   On: 04/11/2015 22:28   Ct Chest Wo Contrast  04/12/2015  CLINICAL DATA:  Respiratory failure.  EXAM: CT CHEST WITHOUT CONTRAST TECHNIQUE: Multidetector CT imaging of the chest was performed following the standard protocol without IV contrast. COMPARISON:  02/07/2015 FINDINGS: Mediastinum: Previous median sternotomy and CABG procedure. Moderate cardiac enlargement. Aortic atherosclerosis noted. The trachea is patent and midline. The ET tube tip is above the carina. OG tube appears scratch set there is a nasogastric tube within a normal appearing esophagus. Prominent mediastinal nodes are identified. Index right paratracheal lymph node measures 1.4 cm, image 23/ series 2. Unchanged from previous exam. No axillary or supraclavicular adenopathy. Lungs/Pleura: Moderate to large right pleural effusion identified. There is airspace consolidation involving the entire right lower lobe. This appears progressive when compared with previous exam. Small left pleural effusion with posterior medial consolidation is identified. Upper Abdomen: The adrenal glands are normal. Normal appearance of the liver and spleen. The visualized portions of the pancreas appear normal. Musculoskeletal: Spondylosis noted within the thoracic spine. No aggressive lytic or sclerotic bone lesions. IMPRESSION: 1. Interval increase in volume of right pleural effusion with complete consolidation of the right lower lobe. 2. New posterior medial consolidation of the left lower lobe. 3. Cardiac enlargement and aortic atherosclerosis. 4. Enlarged right paratracheal lymph node. Similar  to previous exam. Electronically Signed   By: Signa Kellaylor  Stroud M.D.   On: 04/12/2015 16:31   [place] Chest  04/12/2015  CLINICAL DATA:  Pleural effusion. EXAM: CHEST ULTRASOUND COMPARISON:  Chest x-ray 04/11/2015. FINDINGS: Small right pleural effusion is noted. Patient could not tolerate positioning for evaluation. Given the small size of the effusion and difficulty in positioning patient, thoracentesis not performed. IMPRESSION: Small pleural effusion.  Thoracentesis not  performed. Electronically Signed   By: Maisie Fushomas  Register   On: 04/12/2015 10:29   Dg Chest Port 1 View  04/12/2015  CLINICAL DATA:  Endotracheal tube placement. EXAM: PORTABLE CHEST 1 VIEW COMPARISON:  April 11, 2015. FINDINGS: Stable cardiomegaly. Status post coronary artery bypass graft. Patient is rotated to the right. Mild central pulmonary vascular congestion is noted. Moderate right pleural effusion is again noted with probable underlying atelectasis or infiltrate. No pneumothorax is noted. Endotracheal tube is seen projected over tracheal air shadow with distal tip at least 2 cm above the carina. Nasogastric tube is seen entering the stomach. IMPRESSION: Endotracheal tube appears to be in grossly good position. Stable cardiomegaly and central pulmonary vascular congestion is noted. Stable moderate right pleural effusion with probable underlying atelectasis or infiltrate. Electronically Signed   By: Lupita RaiderJames  Green Jr, M.D.   On: 04/12/2015 11:04   Dg Chest Portable 1 View  04/11/2015  CLINICAL DATA:  Hypoxia.  Cough. EXAM: PORTABLE CHEST 1 VIEW COMPARISON:  February 07, 2015. FINDINGS: Stable cardiomegaly. Status post coronary artery bypass graft. No pneumothorax is noted. Mild central pulmonary vascular congestion is again noted and stable. Mild left pleural effusion is noted. Moderate right pleural effusion is noted which is increased compared to prior exam, with probable underlying atelectasis or infiltrate. Bony thorax is unremarkable. IMPRESSION: Stable mild left pleural effusion. Significantly increased moderate right pleural effusion with probable underlying atelectasis or infiltrate. Electronically Signed   By: Lupita RaiderJames  Green Jr, M.D.   On: 04/11/2015 18:31    Scheduled Meds: . antiseptic oral rinse  7 mL Mouth Rinse QID  . azithromycin  500 mg Intravenous Q24H  . cefTRIAXone (ROCEPHIN)  IV  1 g Intravenous Q24H  . chlorhexidine gluconate  15 mL Mouth Rinse BID  . clonazePAM  1 mg Oral BID   . enoxaparin (LOVENOX) injection  40 mg Subcutaneous Q24H  . furosemide  40 mg Intravenous Daily  . insulin aspart  0-15 Units Subcutaneous 6 times per day  . ipratropium-albuterol  3 mL Nebulization Q4H  . methylPREDNISolone (SOLU-MEDROL) injection  40 mg Intravenous Q12H  . metoprolol  5 mg Intravenous 4 times per day  . pantoprazole (PROTONIX) IV  40 mg Intravenous Daily  . sodium chloride  3 mL Intravenous Q12H   Continuous Infusions: . propofol (DIPRIVAN) infusion 50 mcg/kg/min (04/13/15 0902)    Principal Problem:   Ventilator dependence (HCC) Active Problems:   Acute respiratory failure with hypercapnia (HCC)   Diabetes (HCC)   TOBACCO USER   Essential hypertension   CORONARY ATHEROSCLEROSIS NATIVE CORONARY ARTERY   Pleural effusion, right   COPD with acute exacerbation (HCC)   Diastolic CHF, acute on chronic (HCC)   CAP (community acquired pneumonia)    Critical care time spent: 35 minutes. Greater than 50% of this time was spent in direct contact with the patient coordinating care.    Chaya JanHERNANDEZ ACOSTA,ESTELA  [facility] Pager [phone]  If 7PM-7AM, please contact night-coverage at [email or web address], password [email or web address] 04/13/2015, 9:41 AM  LOS: 2 days

## 2015-04-14 ENCOUNTER — Inpatient Hospital Stay (HOSPITAL_COMMUNITY): Payer: Medicare HMO

## 2015-04-14 LAB — BLOOD GAS, ARTERIAL
Acid-Base Excess: 6.4 mmol/L — ABNORMAL HIGH (ref 0.0–2.0)
Bicarbonate: 30 mEq/L — ABNORMAL HIGH (ref 20.0–24.0)
Drawn by: 105551
FIO2: 0.5
LHR: 14 {breaths}/min
MECHVT: 540 mL
O2 Saturation: 86.2 %
PEEP: 5 cmH2O
PO2 ART: 54.9 mmHg — AB (ref 80.0–100.0)
pCO2 arterial: 40.1 mmHg (ref 35.0–45.0)
pH, Arterial: 7.486 — ABNORMAL HIGH (ref 7.350–7.450)

## 2015-04-14 LAB — CBC
HCT: 41.7 % (ref 39.0–52.0)
Hemoglobin: 12.4 g/dL — ABNORMAL LOW (ref 13.0–17.0)
MCH: 26.2 pg (ref 26.0–34.0)
MCHC: 29.7 g/dL — ABNORMAL LOW (ref 30.0–36.0)
MCV: 88 fL (ref 78.0–100.0)
PLATELETS: 144 10*3/uL — AB (ref 150–400)
RBC: 4.74 MIL/uL (ref 4.22–5.81)
RDW: 19.2 % — AB (ref 11.5–15.5)
WBC: 6.6 10*3/uL (ref 4.0–10.5)

## 2015-04-14 LAB — GLUCOSE, CAPILLARY
GLUCOSE-CAPILLARY: 115 mg/dL — AB (ref 65–99)
GLUCOSE-CAPILLARY: 121 mg/dL — AB (ref 65–99)
GLUCOSE-CAPILLARY: 149 mg/dL — AB (ref 65–99)
GLUCOSE-CAPILLARY: 80 mg/dL (ref 65–99)
Glucose-Capillary: 88 mg/dL (ref 65–99)
Glucose-Capillary: 138 mg/dL — ABNORMAL HIGH (ref 65–99)

## 2015-04-14 LAB — BASIC METABOLIC PANEL
Anion gap: 12 (ref 5–15)
BUN: 43 mg/dL — ABNORMAL HIGH (ref 6–20)
CHLORIDE: 105 mmol/L (ref 101–111)
CO2: 25 mmol/L (ref 22–32)
CREATININE: 1.58 mg/dL — AB (ref 0.61–1.24)
Calcium: 8.9 mg/dL (ref 8.9–10.3)
GFR, EST AFRICAN AMERICAN: 48 mL/min — AB (ref >60)
GFR, EST NON AFRICAN AMERICAN: 42 mL/min — AB (ref >60)
Glucose, Bld: 82 mg/dL (ref 65–99)
POTASSIUM: 4.3 mmol/L (ref 3.5–5.1)
SODIUM: 142 mmol/L (ref 135–145)

## 2015-04-14 LAB — EXPECTORATED SPUTUM ASSESSMENT W REFEX TO RESP CULTURE

## 2015-04-14 LAB — EXPECTORATED SPUTUM ASSESSMENT W GRAM STAIN, RFLX TO RESP C

## 2015-04-14 MED ORDER — VITAL HIGH PROTEIN PO LIQD
1000.0000 mL | ORAL | Status: DC
  Administered 2015-04-14: 1000 mL
  Administered 2015-04-14 – 2015-04-15 (×12)
  Filled 2015-04-14 (×4): qty 1000

## 2015-04-14 NOTE — Progress Notes (Signed)
Subjective: He remains intubated and on mechanical ventilation. He is sedated. He is still requiring greater than 50% oxygen. Chest x-ray this morning is pending.  Objective: Vital signs in last 24 hours: Temp:  [97 F (36.1 C)-98.7 F (37.1 C)] 97 F (36.1 C) (12/18 0725) Pulse Rate:  [51-78] 65 (12/18 0845) Resp:  [12-28] 14 (12/18 0845) BP: (95-130)/(52-90) 114/61 mmHg (12/18 0845) SpO2:  [90 %-100 %] 97 % (12/18 0845) FiO2 (%):  [50 %-60 %] 55 % (12/18 0828) Weight:  [85.6 kg (188 lb 11.4 oz)] 85.6 kg (188 lb 11.4 oz) (12/18 0400) Weight change: -0.5 kg (-1 lb 1.6 oz) Last BM Date: 04/11/15  Intake/Output from previous day: 12/17 0701 - 12/18 0700 In: 1765.4 [I.V.:565.4; IV Piggyback:300] Out: 675 [Urine:675]  PHYSICAL EXAM General appearance: Intubated sedated on mechanical ventilation Resp: rhonchi bilaterally Cardio: regular rate and rhythm, S1, S2 normal, no murmur, click, rub or gallop GI: soft, non-tender; bowel sounds normal; no masses,  no organomegaly Extremities: extremities normal, atraumatic, no cyanosis or edema  Lab Results:  Results for orders placed or performed during the hospital encounter of 04/11/15 (from the past 48 hour(s))  Glucose, capillary     Status: Abnormal   Collection Time: 04/12/15 11:51 AM  Result Value Ref Range   Glucose-Capillary 151 (H) 65 - 99 mg/dL  Blood gas, arterial     Status: Abnormal   Collection Time: 04/12/15  2:50 PM  Result Value Ref Range   FIO2 0.70    Delivery systems VENTILATOR    Mode PRESSURE REGULATED VOLUME CONTROL    VT 530 mL   LHR 14 resp/min   Peep/cpap 5.0 cm H20   pH, Arterial 7.533 (H) 7.350 - 7.450   pCO2 arterial 38.6 35.0 - 45.0 mmHg   pO2, Arterial 98.5 80.0 - 100.0 mmHg   Bicarbonate 32.9 (H) 20.0 - 24.0 mEq/L   Acid-Base Excess 9.0 (H) 0.0 - 2.0 mmol/L   O2 Saturation 98.5 %   Patient temperature 37.0    Collection site LEFT BRACHIAL    Drawn by [id number]    Sample type ARTERIAL DRAW    Allens test (pass/fail) PASS PASS  Glucose, capillary     Status: Abnormal   Collection Time: 04/12/15  3:53 PM  Result Value Ref Range   Glucose-Capillary 102 (H) 65 - 99 mg/dL   Comment 1 Notify RN   Glucose, capillary     Status: None   Collection Time: 04/12/15  7:45 PM  Result Value Ref Range   Glucose-Capillary 68 65 - 99 mg/dL   Comment 1 Notify RN    Comment 2 Document in Chart   Glucose, capillary     Status: Abnormal   Collection Time: 04/12/15 11:41 PM  Result Value Ref Range   Glucose-Capillary 101 (H) 65 - 99 mg/dL   Comment 1 Document in Chart   Glucose, capillary     Status: Abnormal   Collection Time: 04/13/15  4:18 AM  Result Value Ref Range   Glucose-Capillary 121 (H) 65 - 99 mg/dL  Basic metabolic panel     Status: Abnormal   Collection Time: 04/13/15  4:20 AM  Result Value Ref Range   Sodium 141 135 - 145 mmol/L   Potassium 4.6 3.5 - 5.1 mmol/L   Chloride 100 (L) 101 - 111 mmol/L   CO2 31 22 - 32 mmol/L   Glucose, Bld 120 (H) 65 - 99 mg/dL   BUN 34 (H) 6 - 20 mg/dL  Creatinine, Ser 1.65 (H) 0.61 - 1.24 mg/dL   Calcium 8.8 (L) 8.9 - 10.3 mg/dL   GFR calc non Af Amer 40 (L) >60 mL/min   GFR calc Af Amer 46 (L) >60 mL/min    Comment: (NOTE) The eGFR has been calculated using the CKD EPI equation. This calculation has not been validated in all clinical situations. eGFR's persistently <60 mL/min signify possible Chronic Kidney Disease.    Anion gap 10 5 - 15  CBC     Status: Abnormal   Collection Time: 04/13/15  4:20 AM  Result Value Ref Range   WBC 7.3 4.0 - 10.5 K/uL   RBC 4.34 4.22 - 5.81 MIL/uL   Hemoglobin 11.0 (L) 13.0 - 17.0 g/dL   HCT 39.1 39.0 - 52.0 %   MCV 90.1 78.0 - 100.0 fL   MCH 25.3 (L) 26.0 - 34.0 pg   MCHC 28.1 (L) 30.0 - 36.0 g/dL   RDW 19.0 (H) 11.5 - 15.5 %   Platelets 188 150 - 400 K/uL  Glucose, capillary     Status: Abnormal   Collection Time: 04/13/15  7:51 AM  Result Value Ref Range   Glucose-Capillary 106 (H) 65 - 99  mg/dL   Comment 1 Notify RN    Comment 2 Document in Chart   Culture, blood (Routine X 2) w Reflex to ID Panel     Status: None (Preliminary result)   Collection Time: 04/13/15 10:02 AM  Result Value Ref Range   Specimen Description BLOOD LEFT ARM    Special Requests BOTTLES DRAWN AEROBIC AND ANAEROBIC 6CC EACH    Culture NO GROWTH < 24 HOURS    Report Status PENDING   Culture, blood (Routine X 2) w Reflex to ID Panel     Status: None (Preliminary result)   Collection Time: 04/13/15 10:08 AM  Result Value Ref Range   Specimen Description BLOOD LEFT HAND    Special Requests BOTTLES DRAWN AEROBIC AND ANAEROBIC 5CC EACH    Culture NO GROWTH < 24 HOURS    Report Status PENDING   Glucose, capillary     Status: Abnormal   Collection Time: 04/13/15 11:29 AM  Result Value Ref Range   Glucose-Capillary 150 (H) 65 - 99 mg/dL   Comment 1 Notify RN    Comment 2 Document in Chart   Glucose, capillary     Status: Abnormal   Collection Time: 04/13/15  3:53 PM  Result Value Ref Range   Glucose-Capillary 137 (H) 65 - 99 mg/dL   Comment 1 Document in Chart    Comment 2 Repeat Test   Glucose, capillary     Status: Abnormal   Collection Time: 04/13/15  8:51 PM  Result Value Ref Range   Glucose-Capillary 104 (H) 65 - 99 mg/dL   Comment 1 Notify RN    Comment 2 Document in Chart   Glucose, capillary     Status: None   Collection Time: 04/13/15 11:42 PM  Result Value Ref Range   Glucose-Capillary 96 65 - 99 mg/dL  Glucose, capillary     Status: None   Collection Time: 04/14/15  4:17 AM  Result Value Ref Range   Glucose-Capillary 88 65 - 99 mg/dL  Basic metabolic panel     Status: Abnormal   Collection Time: 04/14/15  4:55 AM  Result Value Ref Range   Sodium 142 135 - 145 mmol/L   Potassium 4.3 3.5 - 5.1 mmol/L   Chloride 105 101 - 111 mmol/L  CO2 25 22 - 32 mmol/L   Glucose, Bld 82 65 - 99 mg/dL   BUN 43 (H) 6 - 20 mg/dL   Creatinine, Ser 1.58 (H) 0.61 - 1.24 mg/dL   Calcium 8.9 8.9 -  10.3 mg/dL   GFR calc non Af Amer 42 (L) >60 mL/min   GFR calc Af Amer 48 (L) >60 mL/min    Comment: (NOTE) The eGFR has been calculated using the CKD EPI equation. This calculation has not been validated in all clinical situations. eGFR's persistently <60 mL/min signify possible Chronic Kidney Disease.    Anion gap 12 5 - 15  CBC     Status: Abnormal   Collection Time: 04/14/15  4:55 AM  Result Value Ref Range   WBC 6.6 4.0 - 10.5 K/uL   RBC 4.74 4.22 - 5.81 MIL/uL   Hemoglobin 12.4 (L) 13.0 - 17.0 g/dL   HCT 41.7 39.0 - 52.0 %   MCV 88.0 78.0 - 100.0 fL   MCH 26.2 26.0 - 34.0 pg   MCHC 29.7 (L) 30.0 - 36.0 g/dL   RDW 19.2 (H) 11.5 - 15.5 %   Platelets 144 (L) 150 - 400 K/uL    Comment: SPECIMEN CHECKED FOR CLOTS PLATELET COUNT CONFIRMED BY SMEAR LARGE PLATELETS PRESENT   Blood gas, arterial     Status: Abnormal   Collection Time: 04/14/15  5:20 AM  Result Value Ref Range   FIO2 0.50    Delivery systems PRESSURE REGULATED VOLUME CONTROL    Mode VENTILATOR    VT 540 mL   LHR 14 resp/min   Peep/cpap 5.0 cm H20   pH, Arterial 7.486 (H) 7.350 - 7.450   pCO2 arterial 40.1 35.0 - 45.0 mmHg   pO2, Arterial 54.9 (L) 80.0 - 100.0 mmHg   Bicarbonate 30.0 (H) 20.0 - 24.0 mEq/L   Acid-Base Excess 6.4 (H) 0.0 - 2.0 mmol/L   O2 Saturation 86.2 %   Collection site BRACHIAL ARTERY    Drawn by [id number]    Sample type ARTERIAL    Allens test (pass/fail) NOT INDICATED (A) PASS  Glucose, capillary     Status: None   Collection Time: 04/14/15  7:29 AM  Result Value Ref Range   Glucose-Capillary 80 65 - 99 mg/dL   Comment 1 Document in Chart    Comment 2 Repeat Test     ABGS  Recent Labs  04/12/15 0502  04/14/15 0520  PHART 7.272*  < > 7.486*  PO2ART 68.4*  < > 54.9*  TCO2 14.7  --   --   HCO3 29.3*  < > 30.0*  < > = values in this interval not displayed. CULTURES Recent Results (from the past 240 hour(s))  MRSA PCR Screening     Status: None   Collection Time: 04/11/15  11:42 AM  Result Value Ref Range Status   MRSA by PCR NEGATIVE NEGATIVE Final    Comment:        The GeneXpert MRSA Assay (FDA approved for NASAL specimens only), is one component of a comprehensive MRSA colonization surveillance program. It is not intended to diagnose MRSA infection nor to guide or monitor treatment for MRSA infections.   Culture, blood (Routine X 2) w Reflex to ID Panel     Status: None (Preliminary result)   Collection Time: 04/13/15 10:02 AM  Result Value Ref Range Status   Specimen Description BLOOD LEFT ARM  Final   Special Requests BOTTLES DRAWN AEROBIC AND ANAEROBIC [place]  Final   Culture NO GROWTH < 24 HOURS  Final   Report Status PENDING  Incomplete  Culture, blood (Routine X 2) w Reflex to ID Panel     Status: None (Preliminary result)   Collection Time: 04/13/15 10:08 AM  Result Value Ref Range Status   Specimen Description BLOOD LEFT HAND  Final   Special Requests BOTTLES DRAWN AEROBIC AND ANAEROBIC 5CC EACH  Final   Culture NO GROWTH < 24 HOURS  Final   Report Status PENDING  Incomplete   Studies/Results: Ct Chest Wo Contrast  04/12/2015  CLINICAL DATA:  Respiratory failure. EXAM: CT CHEST WITHOUT CONTRAST TECHNIQUE: Multidetector CT imaging of the chest was performed following the standard protocol without IV contrast. COMPARISON:  02/07/2015 FINDINGS: Mediastinum: Previous median sternotomy and CABG procedure. Moderate cardiac enlargement. Aortic atherosclerosis noted. The trachea is patent and midline. The ET tube tip is above the carina. OG tube appears scratch set there is a nasogastric tube within a normal appearing esophagus. Prominent mediastinal nodes are identified. Index right paratracheal lymph node measures 1.4 cm, image 23/ series 2. Unchanged from previous exam. No axillary or supraclavicular adenopathy. Lungs/Pleura: Moderate to large right pleural effusion identified. There is airspace consolidation involving the entire right lower  lobe. This appears progressive when compared with previous exam. Small left pleural effusion with posterior medial consolidation is identified. Upper Abdomen: The adrenal glands are normal. Normal appearance of the liver and spleen. The visualized portions of the pancreas appear normal. Musculoskeletal: Spondylosis noted within the thoracic spine. No aggressive lytic or sclerotic bone lesions. IMPRESSION: 1. Interval increase in volume of right pleural effusion with complete consolidation of the right lower lobe. 2. New posterior medial consolidation of the left lower lobe. 3. Cardiac enlargement and aortic atherosclerosis. 4. Enlarged right paratracheal lymph node. Similar to previous exam. Electronically Signed   By: Kerby Moors M.D.   On: 04/12/2015 16:31   [place] Chest  04/12/2015  CLINICAL DATA:  Pleural effusion. EXAM: CHEST ULTRASOUND COMPARISON:  Chest x-ray 04/11/2015. FINDINGS: Small right pleural effusion is noted. Patient could not tolerate positioning for evaluation. Given the small size of the effusion and difficulty in positioning patient, thoracentesis not performed. IMPRESSION: Small pleural effusion.  Thoracentesis not performed. Electronically Signed   By: [place]   On: 04/12/2015 10:29   Dg Chest Port 1 View  04/12/2015  CLINICAL DATA:  Endotracheal tube placement. EXAM: PORTABLE CHEST 1 VIEW COMPARISON:  April 11, 2015. FINDINGS: Stable cardiomegaly. Status post coronary artery bypass graft. Patient is rotated to the right. Mild central pulmonary vascular congestion is noted. Moderate right pleural effusion is again noted with probable underlying atelectasis or infiltrate. No pneumothorax is noted. Endotracheal tube is seen projected over tracheal air shadow with distal tip at least 2 cm above the carina. Nasogastric tube is seen entering the stomach. IMPRESSION: Endotracheal tube appears to be in grossly good position. Stable cardiomegaly and central pulmonary vascular  congestion is noted. Stable moderate right pleural effusion with probable underlying atelectasis or infiltrate. Electronically Signed   By: Marijo Conception, M.D.   On: 04/12/2015 11:04    Medications:  Prior to Admission:  Prescriptions prior to admission  Medication Sig Dispense Refill Last Dose  . amLODipine (NORVASC) 10 MG tablet Take 10 mg by mouth daily.   04/11/2015 at Unknown time  . atorvastatin (LIPITOR) 20 MG tablet Take 20 mg by mouth daily.   04/11/2015 at Unknown time  . clonazePAM (KLONOPIN) 1 MG tablet  Take 1 mg by mouth 2 (two) times daily.   04/11/2015 at Unknown time  . clopidogrel (PLAVIX) 75 MG tablet Take 75 mg by mouth daily.   04/11/2015 at Unknown time  . esomeprazole (NEXIUM) 40 MG capsule Take 40 mg by mouth daily before breakfast.   04/11/2015 at Unknown time  . furosemide (LASIX) 40 MG tablet Take 0.5-1 tablets (20-40 mg total) by mouth every other day. Alternated a half pill with whole pill every other day (Patient taking differently: Take 40 mg by mouth daily. ) 115 tablet 3 unknown  . glimepiride (AMARYL) 2 MG tablet Take 2 mg by mouth daily.    04/11/2015 at Unknown time  . isosorbide mononitrate (IMDUR) 30 MG 24 hr tablet Take 30 mg by mouth daily.   04/11/2015 at Unknown time  . lisinopril (PRINIVIL,ZESTRIL) 2.5 MG tablet Take 2.5 mg by mouth daily.   04/11/2015 at Unknown time  . metFORMIN (GLUCOPHAGE) 500 MG tablet Take 1,000 mg by mouth 2 (two) times daily with a meal.   04/11/2015 at Unknown time  . metoprolol (LOPRESSOR) 100 MG tablet Take 100 mg by mouth every morning. & 1/2 by mouth in evening   04/11/2015 at Unknown time  . nitroGLYCERIN (NITROSTAT) 0.4 MG SL tablet Place 0.4 mg under the tongue every 5 (five) minutes as needed.   unknown  . oxyCODONE-acetaminophen (PERCOCET) 7.5-325 MG tablet Take 1 tablet by mouth every 4 (four) hours as needed for severe pain (4-6 hours).   unknown  . tiZANidine (ZANAFLEX) 4 MG tablet Take 4 mg by mouth 2 (two) times  daily.    04/11/2015 at Unknown time  . traZODone (DESYREL) 100 MG tablet Take 100 mg by mouth at bedtime.   04/10/2015 at Unknown time  . gabapentin (NEURONTIN) 300 MG capsule Take 300 mg by mouth 3 (three) times daily.   Taking   Scheduled: . antiseptic oral rinse  7 mL Mouth Rinse QID  . azithromycin  500 mg Intravenous Q24H  . cefTRIAXone (ROCEPHIN)  IV  1 g Intravenous Q24H  . chlorhexidine gluconate  15 mL Mouth Rinse BID  . enoxaparin (LOVENOX) injection  40 mg Subcutaneous Q24H  . feeding supplement (VITAL HIGH PROTEIN)  1,000 mL Per Tube Q24H  . furosemide  40 mg Intravenous Daily  . insulin aspart  0-15 Units Subcutaneous 6 times per day  . ipratropium-albuterol  3 mL Nebulization Q6H  . methylPREDNISolone (SOLU-MEDROL) injection  40 mg Intravenous Q12H  . metoprolol  5 mg Intravenous 4 times per day  . pantoprazole (PROTONIX) IV  40 mg Intravenous Daily  . sodium chloride  3 mL Intravenous Q12H   Continuous: . propofol (DIPRIVAN) infusion 50 mcg/kg/min (04/14/15 0826)   ZOX:WRUEAVWU (SUBLIMAZE) injection, fentaNYL (SUBLIMAZE) injection, nitroGLYCERIN, ondansetron **OR** ondansetron (ZOFRAN) IV  Assesment: He was admitted with acute respiratory failure. He has community-acquired pneumonia and also has acute on chronic diastolic heart failure. He has a pleural effusion on the right and this was felt not to be enough to do thoracentesis last week based on ultrasound. CT shows what looks like at least a moderate sized pleural effusion. The he has COPD at baseline and this is also contributing to his respiratory failure. His oxygen flow is too high to allow weaning process today. Principal Problem:   Ventilator dependence ([place]) Active Problems:   Diabetes ([place])   TOBACCO USER   Essential hypertension   CORONARY ATHEROSCLEROSIS NATIVE CORONARY ARTERY   Pleural effusion, right   Acute respiratory  failure with hypercapnia ([facility])   COPD with acute exacerbation ([facility])   Diastolic  CHF, acute on chronic ([facility])   CAP (community acquired pneumonia)    Plan: Continue current treatments. No change in medications. Recheck chest x-ray and blood gases tomorrow see if we can wean his oxygen and if so we can look at trying to get him off the ventilator    LOS: 3 days   Contrina Orona L 04/14/2015, 9:37 AM

## 2015-04-14 NOTE — Progress Notes (Signed)
[facility] PROGRESS NOTE  York GriceMarvin C Rowlands ZOX:[id number] DOB: [birth year] DOA: 04/11/2015 PCP: [name],ASHISH, MD  Assessment/Plan: Acute hypoxemic and hypercarbic respiratory failure -Etiology likely multifactorial: COPD, right greater than left pleural effusion, CHF,  pneumonia. -Was intubated 12/16 due to progression of respiratory failure on BiPAP. -CT scan of the chest performed 12/16 show: Interval increase in volume of right pleural effusion with complete consolidation of the right lower lobe. New posterior medial consolidation of the left lower lobe.  -Please see below for further details. -Appreciate pulmonary consultation.  Right greater than left pleural effusion -Etiology remains unclear to me at present. May represent CHF although may also be related to potential pneumonia. -Thoracentesis was requested, however as per radiology there was not sufficient fluid for drainage. Effusion however appears quite large on CT chest, may be of benefit to try and reattempt thoracentesis at a later date.  Potential community-acquired pneumonia -Agree with Rocephin and azithromycin. -Blood cx remain negative to date. Sputum cx pending.  COPD with acute exacerbation -Continue steroids/nebs.  Diabetes mellitus -Fair control, continue sliding scale. -May need to adjust as starting tube feeds today.  Acute on chronic diastolic CHF -Per history EF appears to be 50-60%. -2-D echo: EF 55-60%, grade 1 diastolic dysfunction. -Agree with IV Lasix, strive for negative fluid balance. -Appreciate cardiology consultation.  History of coronary artery disease -Appears stable at this venue.  Elevated troponin -Mild elevation to 0.05. Likely represents demand ischemia given complexity of overall illness and respiratory failure. -2-D echo: Ejection fraction of 55-60%, normal wall motion, grade 1 diastolic dysfunction. -No further workup planned for at present.  Acute renal  failure -Improving. -Related to acute illness, ATN. -Monitor renal function. -May worsen with diuresis given acute CHF.   Code Status: Full code Family Communication:Wife Annice PihJackie at bedside updated on plan of care and all questions answered. Disposition Plan: Keep in ICU.   Consultants:  Pulmonary  Cardiology   Antibiotics:  Rocephin  Azathioprine   Subjective: Sedated on vent  Objective: Filed Vitals:   04/14/15 0825 04/14/15 0828 04/14/15 0830 04/14/15 0845  BP:   113/62 114/61  Pulse:  77 61 65  Temp:      TempSrc:      Resp:  14 14 14   Height:      Weight:      SpO2: 99% 96% 99% 97%    Intake/Output Summary (Last 24 hours) at 04/14/15 1052 Last data filed at 04/14/15 0831  Gross per 24 hour  Intake   1714 ml  Output    675 ml  Net   1039 ml   Filed Weights   04/12/15 0500 04/13/15 0508 04/14/15 0400  Weight: 85.7 kg (188 lb 15 oz) 86.1 kg (189 lb 13.1 oz) 85.6 kg (188 lb 11.4 oz)    Exam:   General:  Sedated  Cardiovascular: Regular rate and rhythm  Respiratory: Decreased breath sounds bilateral bases  Abdomen: Soft, nontender, nondistended  Extremities: Trace bilateral edema   Neurologic:  Unable to assess given current mental state  Data Reviewed: Basic Metabolic Panel:  Recent Labs Lab 04/11/15 1812 04/12/15 0449 04/13/15 0420 04/14/15 0455  NA 143 142 141 142  K 4.6 5.1 4.6 4.3  CL 99* 100* 100* 105  CO2 35* 34* 31 25  GLUCOSE 114* 161* 120* 82  BUN 20 22* 34* 43*  CREATININE 1.35* 1.44* 1.65* 1.58*  CALCIUM 9.5 9.2 8.8* 8.9   Liver Function Tests:  Recent Labs Lab 04/11/15 1812 04/12/15  0449  AST 15 12*  ALT 8* 7*  ALKPHOS 60 53  BILITOT 1.1 1.0  PROT 7.9 6.9  ALBUMIN 4.0 3.4*   No results for input(s): LIPASE, AMYLASE in the last 168 hours. No results for input(s): AMMONIA in the last 168 hours. CBC:  Recent Labs Lab 04/11/15 1812 04/12/15 0449 04/13/15 0420 04/14/15 0455  WBC 6.5 4.2 7.3 6.6   NEUTROABS 4.7  --   --   --   HGB 12.8* 11.8* 11.0* 12.4*  HCT 47.3 44.4 39.1 41.7  MCV 95.2 94.9 90.1 88.0  PLT 192 180 188 144*   Cardiac Enzymes:  Recent Labs Lab 04/11/15 1812  TROPONINI 0.05*   BNP (last 3 results)  Recent Labs  04/11/15 1812 04/12/15 0449  BNP 422.0* 520.0*    ProBNP (last 3 results) No results for input(s): PROBNP in the last 8760 hours.  CBG:  Recent Labs Lab 04/13/15 1553 04/13/15 2051 04/13/15 2342 04/14/15 0417 04/14/15 0729  GLUCAP 137* 104* 96 88 80    Recent Results (from the past 240 hour(s))  MRSA PCR Screening     Status: None   Collection Time: 04/11/15 11:42 AM  Result Value Ref Range Status   MRSA by PCR NEGATIVE NEGATIVE Final    Comment:        The GeneXpert MRSA Assay (FDA approved for NASAL specimens only), is one component of a comprehensive MRSA colonization surveillance program. It is not intended to diagnose MRSA infection nor to guide or monitor treatment for MRSA infections.   Culture, blood (Routine X 2) w Reflex to ID Panel     Status: None (Preliminary result)   Collection Time: 04/13/15 10:02 AM  Result Value Ref Range Status   Specimen Description BLOOD LEFT ARM  Final   Special Requests BOTTLES DRAWN AEROBIC AND ANAEROBIC 6CC EACH  Final   Culture NO GROWTH < 24 HOURS  Final   Report Status PENDING  Incomplete  Culture, blood (Routine X 2) w Reflex to ID Panel     Status: None (Preliminary result)   Collection Time: 04/13/15 10:08 AM  Result Value Ref Range Status   Specimen Description BLOOD LEFT HAND  Final   Special Requests BOTTLES DRAWN AEROBIC AND ANAEROBIC 5CC EACH  Final   Culture NO GROWTH < 24 HOURS  Final   Report Status PENDING  Incomplete     Studies: Ct Chest Wo Contrast  04/12/2015  CLINICAL DATA:  Respiratory failure. EXAM: CT CHEST WITHOUT CONTRAST TECHNIQUE: Multidetector CT imaging of the chest was performed following the standard protocol without IV contrast. COMPARISON:   02/07/2015 FINDINGS: Mediastinum: Previous median sternotomy and CABG procedure. Moderate cardiac enlargement. Aortic atherosclerosis noted. The trachea is patent and midline. The ET tube tip is above the carina. OG tube appears scratch set there is a nasogastric tube within a normal appearing esophagus. Prominent mediastinal nodes are identified. Index right paratracheal lymph node measures 1.4 cm, image 23/ series 2. Unchanged from previous exam. No axillary or supraclavicular adenopathy. Lungs/Pleura: Moderate to large right pleural effusion identified. There is airspace consolidation involving the entire right lower lobe. This appears progressive when compared with previous exam. Small left pleural effusion with posterior medial consolidation is identified. Upper Abdomen: The adrenal glands are normal. Normal appearance of the liver and spleen. The visualized portions of the pancreas appear normal. Musculoskeletal: Spondylosis noted within the thoracic spine. No aggressive lytic or sclerotic bone lesions. IMPRESSION: 1. Interval increase in volume of right pleural effusion  with complete consolidation of the right lower lobe. 2. New posterior medial consolidation of the left lower lobe. 3. Cardiac enlargement and aortic atherosclerosis. 4. Enlarged right paratracheal lymph node. Similar to previous exam. Electronically Signed   By: Signa Kell M.D.   On: 04/12/2015 16:31   Dg Chest Port 1 View  04/12/2015  CLINICAL DATA:  Endotracheal tube placement. EXAM: PORTABLE CHEST 1 VIEW COMPARISON:  April 11, 2015. FINDINGS: Stable cardiomegaly. Status post coronary artery bypass graft. Patient is rotated to the right. Mild central pulmonary vascular congestion is noted. Moderate right pleural effusion is again noted with probable underlying atelectasis or infiltrate. No pneumothorax is noted. Endotracheal tube is seen projected over tracheal air shadow with distal tip at least 2 cm above the carina. Nasogastric  tube is seen entering the stomach. IMPRESSION: Endotracheal tube appears to be in grossly good position. Stable cardiomegaly and central pulmonary vascular congestion is noted. Stable moderate right pleural effusion with probable underlying atelectasis or infiltrate. Electronically Signed   By: Lupita Raider, M.D.   On: 04/12/2015 11:04    Scheduled Meds: . antiseptic oral rinse  7 mL Mouth Rinse QID  . azithromycin  500 mg Intravenous Q24H  . cefTRIAXone (ROCEPHIN)  IV  1 g Intravenous Q24H  . chlorhexidine gluconate  15 mL Mouth Rinse BID  . enoxaparin (LOVENOX) injection  40 mg Subcutaneous Q24H  . feeding supplement (VITAL HIGH PROTEIN)  1,000 mL Per Tube Q24H  . furosemide  40 mg Intravenous Daily  . insulin aspart  0-15 Units Subcutaneous 6 times per day  . ipratropium-albuterol  3 mL Nebulization Q6H  . methylPREDNISolone (SOLU-MEDROL) injection  40 mg Intravenous Q12H  . metoprolol  5 mg Intravenous 4 times per day  . pantoprazole (PROTONIX) IV  40 mg Intravenous Daily  . sodium chloride  3 mL Intravenous Q12H   Continuous Infusions: . propofol (DIPRIVAN) infusion 50 mcg/kg/min (04/14/15 0826)    Principal Problem:   Ventilator dependence (HCC) Active Problems:   Acute respiratory failure with hypercapnia (HCC)   Diabetes (HCC)   TOBACCO USER   Essential hypertension   CORONARY ATHEROSCLEROSIS NATIVE CORONARY ARTERY   Pleural effusion, right   COPD with acute exacerbation (HCC)   Diastolic CHF, acute on chronic (HCC)   CAP (community acquired pneumonia)    Critical care time spent: 35 minutes. Greater than 50% of this time was spent in direct contact with the patient coordinating care.    Chaya Jan  [facility] Pager [phone]  If 7PM-7AM, please contact night-coverage at [email or web address], password [facility] 04/14/2015, 10:52 AM  LOS: 3 days

## 2015-04-14 NOTE — Progress Notes (Signed)
Patient has been wearing pulse ox probe on forehead which usually gives a false high reading. Usually higher than when placed on digits, Blood gas results show saturation high 85-88. Oxygen increased to 55 from 50.

## 2015-04-14 NOTE — Progress Notes (Signed)
Have moved pulse oximetry from forehead to rt leg toe. It has good wave form but saturations are lower. Oxygen has been increased to 60  Percent. Suspect oxygen has been lower than reading on forehead.

## 2015-04-15 ENCOUNTER — Inpatient Hospital Stay (HOSPITAL_COMMUNITY): Payer: Medicare HMO

## 2015-04-15 DIAGNOSIS — I2510 Atherosclerotic heart disease of native coronary artery without angina pectoris: Secondary | ICD-10-CM

## 2015-04-15 LAB — TRIGLYCERIDES: Triglycerides: 126 mg/dL (ref <150)

## 2015-04-15 LAB — BLOOD GAS, ARTERIAL
ACID-BASE EXCESS: 7 mmol/L — AB (ref 0.0–2.0)
BICARBONATE: 30.4 meq/L — AB (ref 20.0–24.0)
Drawn by: 105551
FIO2: 0.6
LHR: 14 {breaths}/min
O2 SAT: 91.5 %
PATIENT TEMPERATURE: 98.6
PCO2 ART: 45.1 mmHg — AB (ref 35.0–45.0)
PEEP: 5 cmH2O
PH ART: 7.452 — AB (ref 7.350–7.450)
VT: 540 mL
pO2, Arterial: 68.4 mmHg — ABNORMAL LOW (ref 80.0–100.0)

## 2015-04-15 LAB — GLUCOSE, CAPILLARY
GLUCOSE-CAPILLARY: 135 mg/dL — AB (ref 65–99)
Glucose-Capillary: 115 mg/dL — ABNORMAL HIGH (ref 65–99)
Glucose-Capillary: 143 mg/dL — ABNORMAL HIGH (ref 65–99)
Glucose-Capillary: 120 mg/dL — ABNORMAL HIGH (ref 65–99)

## 2015-04-15 MED ORDER — VITAL HIGH PROTEIN PO LIQD
1000.0000 mL | ORAL | Status: DC
  Administered 2015-04-16 – 2015-04-17 (×2): 1000 mL
  Administered 2015-04-17 (×12)
  Administered 2015-04-17: 1000 mL
  Administered 2015-04-17 – 2015-04-18 (×5)
  Administered 2015-04-18: 1000 mL
  Administered 2015-04-18 (×2)
  Administered 2015-04-18: 1000 mL
  Administered 2015-04-18 – 2015-04-19 (×5)
  Filled 2015-04-15 (×7): qty 1000

## 2015-04-15 MED ORDER — PRO-STAT SUGAR FREE PO LIQD
60.0000 mL | Freq: Three times a day (TID) | ORAL | Status: DC
Start: 2015-04-15 — End: 2015-04-22
  Administered 2015-04-15 – 2015-04-22 (×22): 60 mL
  Filled 2015-04-15 (×20): qty 60

## 2015-04-15 NOTE — Progress Notes (Signed)
[facility] PROGRESS NOTE  NHIA HEAPHY ZOX:[id number] DOB: 1941-03-10 DOA: 04/11/2015 PCP: [name], MD  Assessment/Plan: Acute hypoxemic and hypercarbic respiratory failure -Etiology likely multifactorial: COPD, right greater than left pleural effusion, CHF,  pneumonia. -Was intubated 12/16 due to progression of respiratory failure on BiPAP. -CT scan of the chest performed 12/16 show: Interval increase in volume of right pleural effusion with complete consolidation of the right lower lobe. New posterior medial consolidation of the left lower lobe.  -Please see below for further details. -Appreciate pulmonary consultation. -Still on 60% oxygen. No weaning attempts today.  Right greater than left pleural effusion -Etiology remains unclear to me at present. May represent CHF although may also be parapneumonic. -Thoracentesis was requested, however as per radiology there was not sufficient fluid for drainage. Effusion however appears quite large on CT chest, may be of benefit to try and reattempt thoracentesis at a later date.  Potential community-acquired pneumonia -Agree with Rocephin and azithromycin. -Blood cx remain negative to date. Sputum cx pending.  COPD with acute exacerbation -Continue steroids/nebs.  Diabetes mellitus -Fair control, continue sliding scale.  Nutrition -Started on tube feeds.  Acute on chronic diastolic CHF -Per history EF appears to be 50-60%. -2-D echo: EF 55-60%, grade 1 diastolic dysfunction. -Agree with IV Lasix, strive for negative fluid balance. (918 cc negative since admission). -Appreciate cardiology consultation.  History of coronary artery disease -Appears stable at this venue.  Elevated troponin -Mild elevation to 0.05. Likely represents demand ischemia given complexity of overall illness and respiratory failure. -2-D echo: Ejection fraction of 55-60%, normal wall motion, grade 1 diastolic dysfunction. -No further  workup planned for at present.  Acute renal failure -Improving. -Related to acute illness, ATN. -Monitor renal function. -May worsen with diuresis given acute CHF.   Code Status: Full code Family Communication:Wife Jackie at bedside 12/18 and updated on plan of care and all questions answered. Disposition Plan: Keep in ICU.   Consultants:  Pulmonary  Cardiology   Antibiotics:  Rocephin  Azathioprine   Subjective: Sedated on vent  Objective: Filed Vitals:   04/15/15 0800 04/15/15 0815 04/15/15 0835 04/15/15 0836  BP: 111/61     Pulse: 57   56  Temp:  98 F (36.7 C)    TempSrc:  Axillary    Resp: 14   14  Height:      Weight:      SpO2: 91%  91% 91%    Intake/Output Summary (Last 24 hours) at 04/15/15 1003 Last data filed at 04/15/15 0600  Gross per 24 hour  Intake   1267 ml  Output   3475 ml  Net  -2208 ml   Filed Weights   04/13/15 0508 04/14/15 0400 04/15/15 0500  Weight: 86.1 kg (189 lb 13.1 oz) 85.6 kg (188 lb 11.4 oz) 84.1 kg (185 lb 6.5 oz)    Exam:   General:  Sedated  Cardiovascular: Regular rate and rhythm  Respiratory: Decreased breath sounds bilateral bases  Abdomen: Soft, nontender, nondistended  Extremities: Trace bilateral edema   Neurologic:  Unable to assess given current mental state  Data Reviewed: Basic Metabolic Panel:  Recent Labs Lab 04/11/15 1812 04/12/15 0449 04/13/15 0420 04/14/15 0455  NA 143 142 141 142  K 4.6 5.1 4.6 4.3  CL 99* 100* 100* 105  CO2 35* 34* 31 25  GLUCOSE 114* 161* 120* 82  BUN 20 22* 34* 43*  CREATININE 1.35* 1.44* 1.65* 1.58*  CALCIUM 9.5 9.2 8.8* 8.9  Liver Function Tests:  Recent Labs Lab 04/11/15 1812 04/12/15 0449  AST 15 12*  ALT 8* 7*  ALKPHOS 60 53  BILITOT 1.1 1.0  PROT 7.9 6.9  ALBUMIN 4.0 3.4*   No results for input(s): LIPASE, AMYLASE in the last 168 hours. No results for input(s): AMMONIA in the last 168 hours. CBC:  Recent Labs Lab 04/11/15 1812  04/12/15 0449 04/13/15 0420 04/14/15 0455  WBC 6.5 4.2 7.3 6.6  NEUTROABS 4.7  --   --   --   HGB 12.8* 11.8* 11.0* 12.4*  HCT 47.3 44.4 39.1 41.7  MCV 95.2 94.9 90.1 88.0  PLT 192 180 188 144*   Cardiac Enzymes:  Recent Labs Lab 04/11/15 1812  TROPONINI 0.05*   BNP (last 3 results)  Recent Labs  04/11/15 1812 04/12/15 0449  BNP 422.0* 520.0*    ProBNP (last 3 results) No results for input(s): PROBNP in the last 8760 hours.  CBG:  Recent Labs Lab 04/14/15 1616 04/14/15 1941 04/14/15 2346 04/15/15 0512 04/15/15 0719  GLUCAP 138* 115* 149* 135* 115*    Recent Results (from the past 240 hour(s))  MRSA PCR Screening     Status: None   Collection Time: 04/11/15 11:42 AM  Result Value Ref Range Status   MRSA by PCR NEGATIVE NEGATIVE Final    Comment:        The GeneXpert MRSA Assay (FDA approved for NASAL specimens only), is one component of a comprehensive MRSA colonization surveillance program. It is not intended to diagnose MRSA infection nor to guide or monitor treatment for MRSA infections.   Culture, blood (Routine X 2) w Reflex to ID Panel     Status: None (Preliminary result)   Collection Time: 04/13/15 10:02 AM  Result Value Ref Range Status   Specimen Description BLOOD LEFT ARM  Final   Special Requests BOTTLES DRAWN AEROBIC AND ANAEROBIC 6CC EACH  Final   Culture NO GROWTH 2 DAYS  Final   Report Status PENDING  Incomplete  Culture, blood (Routine X 2) w Reflex to ID Panel     Status: None (Preliminary result)   Collection Time: 04/13/15 10:08 AM  Result Value Ref Range Status   Specimen Description BLOOD LEFT HAND  Final   Special Requests BOTTLES DRAWN AEROBIC AND ANAEROBIC 5CC EACH  Final   Culture NO GROWTH 2 DAYS  Final   Report Status PENDING  Incomplete  Culture, expectorated sputum-assessment     Status: None   Collection Time: 04/14/15  9:48 PM  Result Value Ref Range Status   Specimen Description ENDOTRACHEAL  Final   Special  Requests NONE  Final   Sputum evaluation THIS SPECIMEN IS ACCEPTABLE FOR SPUTUM CULTURE  Final   Report Status 04/14/2015 FINAL  Final     Studies: Dg Chest Port 1 View  04/15/2015  CLINICAL DATA:  Shortness of breath. EXAM: PORTABLE CHEST 1 VIEW COMPARISON:  04/14/2015.  CT 04/12/2015. FINDINGS: Endotracheal tube and right PICC line stable position PICC line in stable position. Prior CABG. Cardiomegaly with pulmonary vascular prominence and interstitial prominence suggesting mild congestive heart failure. Persistent right mid and lower lung infiltrate. Small bilateral pleural effusions. No pneumothorax . IMPRESSION: 1. Endotracheal tube and right PICC line in stable position. 2. Prior CABG. Stable cardiomegaly. Mild pulmonary vascular prominence interstitial prominence noted suggesting mild congestive heart failure. Small bilateral pleural effusions noted. 3. Set right mid lung and lower lung infiltrate. Electronically Signed   By: Maisie Fus  Register  On: 04/15/2015 07:43   Dg Chest Port 1 View  04/14/2015  CLINICAL DATA:  Acute respiratory failure, disorientation. EXAM: PORTABLE CHEST 1 VIEW COMPARISON:  Chest x-ray and chest CT dated 04/12/2015, chest x-ray dated 04/11/2015 and chest x-ray dated 02/07/2015. FINDINGS: Endotracheal tube appears well positioned with tip just above the level of the carina. Enteric tube passes below the diaphragm. A right subclavian central line is better seen on previous chest CT, presumably obscured on this chest x-ray by the numerous lines overlying the right chest and mediastinum. Lung aeration is improved bilaterally. Suspect decreased right pleural effusion. Persistent dense opacities at each lung base compatible with the atelectasis identified on earlier chest CT. IMPRESSION: Improved aeration of both lungs indicating improved fluid status. Suspect at least some decrease in the size of the right pleural effusion. Probably stable atelectasis at each lung base. Tubes  and lines grossly stable in position. Electronically Signed   By: Bary RichardStan  Maynard M.D.   On: 04/14/2015 11:03    Scheduled Meds: . antiseptic oral rinse  7 mL Mouth Rinse QID  . azithromycin  500 mg Intravenous Q24H  . cefTRIAXone (ROCEPHIN)  IV  1 g Intravenous Q24H  . chlorhexidine gluconate  15 mL Mouth Rinse BID  . enoxaparin (LOVENOX) injection  40 mg Subcutaneous Q24H  . feeding supplement (VITAL HIGH PROTEIN)  1,000 mL Per Tube Q24H  . furosemide  40 mg Intravenous Daily  . insulin aspart  0-15 Units Subcutaneous 6 times per day  . ipratropium-albuterol  3 mL Nebulization Q6H  . methylPREDNISolone (SOLU-MEDROL) injection  40 mg Intravenous Q12H  . metoprolol  5 mg Intravenous 4 times per day  . pantoprazole (PROTONIX) IV  40 mg Intravenous Daily  . sodium chloride  3 mL Intravenous Q12H   Continuous Infusions: . propofol (DIPRIVAN) infusion 50 mcg/kg/min (04/15/15 0834)    Principal Problem:   Ventilator dependence (HCC) Active Problems:   Acute respiratory failure with hypercapnia (HCC)   Diabetes (HCC)   TOBACCO USER   Essential hypertension   CORONARY ATHEROSCLEROSIS NATIVE CORONARY ARTERY   Pleural effusion, right   COPD with acute exacerbation (HCC)   Diastolic CHF, acute on chronic (HCC)   CAP (community acquired pneumonia)    Critical care time spent: 35 minutes. Greater than 50% of this time was spent in direct contact with the patient coordinating care.    Chaya JanHERNANDEZ ACOSTA,Derenda Giddings  [facility] Pager [phone]  If 7PM-7AM, please contact night-coverage at [email or web address], password [facility] 04/15/2015, 10:03 AM  LOS: 4 days

## 2015-04-15 NOTE — Progress Notes (Signed)
Subjective: He remains intubated and on the ventilator. He has a combination of pneumonia, COPD exacerbation and congestive heart failure. He is still on 60% oxygen but his blood gas is a little bit better this morning  Objective: Vital signs in last 24 hours: Temp:  [97.4 F (36.3 C)-98.1 F (36.7 C)] 98 F (36.7 C) (12/19 0815) Pulse Rate:  [40-76] 64 (12/19 0600) Resp:  [10-26] 14 (12/19 0600) BP: (94-121)/(54-95) 111/65 mmHg (12/19 0600) SpO2:  [88 %-100 %] 91 % (12/19 0600) FiO2 (%):  [55 %-60 %] 60 % (12/19 0400) Weight:  [84.1 kg (185 lb 6.5 oz)] 84.1 kg (185 lb 6.5 oz) (12/19 0500) Weight change: -1.5 kg (-3 lb 4.9 oz) Last BM Date: 04/11/15  Intake/Output from previous day: 12/18 0701 - 12/19 0700 In: 1644.1 [I.V.:594.1; NG/GT:750; IV Piggyback:300] Out: 3475 [Urine:3475]  PHYSICAL EXAM General appearance: He is intubated sedated and on mechanical ventilation Resp: rhonchi bilaterally Cardio: regular rate and rhythm, S1, S2 normal, no murmur, click, rub or gallop GI: soft, non-tender; bowel sounds normal; no masses,  no organomegaly Extremities: extremities normal, atraumatic, no cyanosis or edema  Lab Results:  Results for orders placed or performed during the hospital encounter of 04/11/15 (from the past 48 hour(s))  Culture, blood (Routine X 2) w Reflex to ID Panel     Status: None (Preliminary result)   Collection Time: 04/13/15 10:02 AM  Result Value Ref Range   Specimen Description BLOOD LEFT ARM    Special Requests BOTTLES DRAWN AEROBIC AND ANAEROBIC 6CC EACH    Culture NO GROWTH < 24 HOURS    Report Status PENDING   Culture, blood (Routine X 2) w Reflex to ID Panel     Status: None (Preliminary result)   Collection Time: 04/13/15 10:08 AM  Result Value Ref Range   Specimen Description BLOOD LEFT HAND    Special Requests BOTTLES DRAWN AEROBIC AND ANAEROBIC 5CC EACH    Culture NO GROWTH < 24 HOURS    Report Status PENDING   Glucose, capillary     Status:  Abnormal   Collection Time: 04/13/15 11:29 AM  Result Value Ref Range   Glucose-Capillary 150 (H) 65 - 99 mg/dL   Comment 1 Notify RN    Comment 2 Document in Chart   Glucose, capillary     Status: Abnormal   Collection Time: 04/13/15  3:53 PM  Result Value Ref Range   Glucose-Capillary 137 (H) 65 - 99 mg/dL   Comment 1 Document in Chart    Comment 2 Repeat Test   Glucose, capillary     Status: Abnormal   Collection Time: 04/13/15  8:51 PM  Result Value Ref Range   Glucose-Capillary 104 (H) 65 - 99 mg/dL   Comment 1 Notify RN    Comment 2 Document in Chart   Glucose, capillary     Status: None   Collection Time: 04/13/15 11:42 PM  Result Value Ref Range   Glucose-Capillary 96 65 - 99 mg/dL  Glucose, capillary     Status: None   Collection Time: 04/14/15  4:17 AM  Result Value Ref Range   Glucose-Capillary 88 65 - 99 mg/dL  Basic metabolic panel     Status: Abnormal   Collection Time: 04/14/15  4:55 AM  Result Value Ref Range   Sodium 142 135 - 145 mmol/L   Potassium 4.3 3.5 - 5.1 mmol/L   Chloride 105 101 - 111 mmol/L   CO2 25 22 - 32 mmol/L  Glucose, Bld 82 65 - 99 mg/dL   BUN 43 (H) 6 - 20 mg/dL   Creatinine, Ser 1.58 (H) 0.61 - 1.24 mg/dL   Calcium 8.9 8.9 - 10.3 mg/dL   GFR calc non Af Amer 42 (L) >60 mL/min   GFR calc Af Amer 48 (L) >60 mL/min    Comment: (NOTE) The eGFR has been calculated using the CKD EPI equation. This calculation has not been validated in all clinical situations. eGFR's persistently <60 mL/min signify possible Chronic Kidney Disease.    Anion gap 12 5 - 15  CBC     Status: Abnormal   Collection Time: 04/14/15  4:55 AM  Result Value Ref Range   WBC 6.6 4.0 - 10.5 K/uL   RBC 4.74 4.22 - 5.81 MIL/uL   Hemoglobin 12.4 (L) 13.0 - 17.0 g/dL   HCT 41.7 39.0 - 52.0 %   MCV 88.0 78.0 - 100.0 fL   MCH 26.2 26.0 - 34.0 pg   MCHC 29.7 (L) 30.0 - 36.0 g/dL   RDW 19.2 (H) 11.5 - 15.5 %   Platelets 144 (L) 150 - 400 K/uL    Comment: SPECIMEN  CHECKED FOR CLOTS PLATELET COUNT CONFIRMED BY SMEAR LARGE PLATELETS PRESENT   Blood gas, arterial     Status: Abnormal   Collection Time: 04/14/15  5:20 AM  Result Value Ref Range   FIO2 0.50    Delivery systems PRESSURE REGULATED VOLUME CONTROL    Mode VENTILATOR    VT 540 mL   LHR 14 resp/min   Peep/cpap 5.0 cm H20   pH, Arterial 7.486 (H) 7.350 - 7.450   pCO2 arterial 40.1 35.0 - 45.0 mmHg   pO2, Arterial 54.9 (L) 80.0 - 100.0 mmHg   Bicarbonate 30.0 (H) 20.0 - 24.0 mEq/L   Acid-Base Excess 6.4 (H) 0.0 - 2.0 mmol/L   O2 Saturation 86.2 %   Collection site BRACHIAL ARTERY    Drawn by [id number]    Sample type ARTERIAL    Allens test (pass/fail) NOT INDICATED (A) PASS  Glucose, capillary     Status: None   Collection Time: 04/14/15  7:29 AM  Result Value Ref Range   Glucose-Capillary 80 65 - 99 mg/dL   Comment 1 Document in Chart    Comment 2 Repeat Test   Glucose, capillary     Status: Abnormal   Collection Time: 04/14/15 11:40 AM  Result Value Ref Range   Glucose-Capillary 121 (H) 65 - 99 mg/dL  Glucose, capillary     Status: Abnormal   Collection Time: 04/14/15  4:16 PM  Result Value Ref Range   Glucose-Capillary 138 (H) 65 - 99 mg/dL   Comment 1 Notify RN    Comment 2 Document in Chart   Glucose, capillary     Status: Abnormal   Collection Time: 04/14/15  7:41 PM  Result Value Ref Range   Glucose-Capillary 115 (H) 65 - 99 mg/dL   Comment 1 Notify RN    Comment 2 Document in Chart   Culture, expectorated sputum-assessment     Status: None   Collection Time: 04/14/15  9:48 PM  Result Value Ref Range   Specimen Description ENDOTRACHEAL    Special Requests NONE    Sputum evaluation THIS SPECIMEN IS ACCEPTABLE FOR SPUTUM CULTURE    Report Status 04/14/2015 FINAL   Glucose, capillary     Status: Abnormal   Collection Time: 04/14/15 11:46 PM  Result Value Ref Range   Glucose-Capillary 149 (H) 65 -  99 mg/dL  Blood gas, arterial     Status: Abnormal   Collection Time:  04/15/15  4:00 AM  Result Value Ref Range   FIO2 0.60    Delivery systems VENTILATOR    Mode PRESSURE REGULATED VOLUME CONTROL    VT 540 mL   LHR 14 resp/min   Peep/cpap 5.0 cm H20   pH, Arterial 7.452 (H) 7.350 - 7.450   pCO2 arterial 45.1 (H) 35.0 - 45.0 mmHg   pO2, Arterial 68.4 (L) 80.0 - 100.0 mmHg   Bicarbonate 30.4 (H) 20.0 - 24.0 mEq/L   Acid-Base Excess 7.0 (H) 0.0 - 2.0 mmol/L   O2 Saturation 91.5 %   Patient temperature 98.6    Collection site RADIAL    Drawn by [id number]    Sample type ARTERIAL    Allens test (pass/fail) PASS PASS  Glucose, capillary     Status: Abnormal   Collection Time: 04/15/15  5:12 AM  Result Value Ref Range   Glucose-Capillary 135 (H) 65 - 99 mg/dL  Glucose, capillary     Status: Abnormal   Collection Time: 04/15/15  7:19 AM  Result Value Ref Range   Glucose-Capillary 115 (H) 65 - 99 mg/dL   Comment 1 Notify RN    Comment 2 Document in Chart     ABGS  Recent Labs  04/15/15 0400  PHART 7.452*  PO2ART 68.4*  HCO3 30.4*   CULTURES Recent Results (from the past 240 hour(s))  MRSA PCR Screening     Status: None   Collection Time: 04/11/15 11:42 AM  Result Value Ref Range Status   MRSA by PCR NEGATIVE NEGATIVE Final    Comment:        The GeneXpert MRSA Assay (FDA approved for NASAL specimens only), is one component of a comprehensive MRSA colonization surveillance program. It is not intended to diagnose MRSA infection nor to guide or monitor treatment for MRSA infections.   Culture, blood (Routine X 2) w Reflex to ID Panel     Status: None (Preliminary result)   Collection Time: 04/13/15 10:02 AM  Result Value Ref Range Status   Specimen Description BLOOD LEFT ARM  Final   Special Requests BOTTLES DRAWN AEROBIC AND ANAEROBIC 6CC EACH  Final   Culture NO GROWTH < 24 HOURS  Final   Report Status PENDING  Incomplete  Culture, blood (Routine X 2) w Reflex to ID Panel     Status: None (Preliminary result)   Collection Time:  04/13/15 10:08 AM  Result Value Ref Range Status   Specimen Description BLOOD LEFT HAND  Final   Special Requests BOTTLES DRAWN AEROBIC AND ANAEROBIC 5CC EACH  Final   Culture NO GROWTH < 24 HOURS  Final   Report Status PENDING  Incomplete  Culture, expectorated sputum-assessment     Status: None   Collection Time: 04/14/15  9:48 PM  Result Value Ref Range Status   Specimen Description ENDOTRACHEAL  Final   Special Requests NONE  Final   Sputum evaluation THIS SPECIMEN IS ACCEPTABLE FOR SPUTUM CULTURE  Final   Report Status 04/14/2015 FINAL  Final   Studies/Results: Dg Chest Port 1 View  04/15/2015  CLINICAL DATA:  Shortness of breath. EXAM: PORTABLE CHEST 1 VIEW COMPARISON:  04/14/2015.  CT 04/12/2015. FINDINGS: Endotracheal tube and right PICC line stable position PICC line in stable position. Prior CABG. Cardiomegaly with pulmonary vascular prominence and interstitial prominence suggesting mild congestive heart failure. Persistent right mid and lower lung infiltrate. Small bilateral  pleural effusions. No pneumothorax . IMPRESSION: 1. Endotracheal tube and right PICC line in stable position. 2. Prior CABG. Stable cardiomegaly. Mild pulmonary vascular prominence interstitial prominence noted suggesting mild congestive heart failure. Small bilateral pleural effusions noted. 3. Set right mid lung and lower lung infiltrate. Electronically Signed   By: [place]   On: 04/15/2015 07:43   Dg Chest Port 1 View  04/14/2015  CLINICAL DATA:  Acute respiratory failure, disorientation. EXAM: PORTABLE CHEST 1 VIEW COMPARISON:  Chest x-ray and chest CT dated 04/12/2015, chest x-ray dated 04/11/2015 and chest x-ray dated 02/07/2015. FINDINGS: Endotracheal tube appears well positioned with tip just above the level of the carina. Enteric tube passes below the diaphragm. A right subclavian central line is better seen on previous chest CT, presumably obscured on this chest x-ray by the numerous lines  overlying the right chest and mediastinum. Lung aeration is improved bilaterally. Suspect decreased right pleural effusion. Persistent dense opacities at each lung base compatible with the atelectasis identified on earlier chest CT. IMPRESSION: Improved aeration of both lungs indicating improved fluid status. Suspect at least some decrease in the size of the right pleural effusion. Probably stable atelectasis at each lung base. Tubes and lines grossly stable in position. Electronically Signed   By: Franki Cabot M.D.   On: 04/14/2015 11:03    Medications:  Prior to Admission:  Prescriptions prior to admission  Medication Sig Dispense Refill Last Dose  . amLODipine (NORVASC) 10 MG tablet Take 10 mg by mouth daily.   04/11/2015 at Unknown time  . atorvastatin (LIPITOR) 20 MG tablet Take 20 mg by mouth daily.   04/11/2015 at Unknown time  . clonazePAM (KLONOPIN) 1 MG tablet Take 1 mg by mouth 2 (two) times daily.   04/11/2015 at Unknown time  . clopidogrel (PLAVIX) 75 MG tablet Take 75 mg by mouth daily.   04/11/2015 at Unknown time  . esomeprazole (NEXIUM) 40 MG capsule Take 40 mg by mouth daily before breakfast.   04/11/2015 at Unknown time  . furosemide (LASIX) 40 MG tablet Take 0.5-1 tablets (20-40 mg total) by mouth every other day. Alternated a half pill with whole pill every other day (Patient taking differently: Take 40 mg by mouth daily. ) 115 tablet 3 unknown  . glimepiride (AMARYL) 2 MG tablet Take 2 mg by mouth daily.    04/11/2015 at Unknown time  . isosorbide mononitrate (IMDUR) 30 MG 24 hr tablet Take 30 mg by mouth daily.   04/11/2015 at Unknown time  . lisinopril (PRINIVIL,ZESTRIL) 2.5 MG tablet Take 2.5 mg by mouth daily.   04/11/2015 at Unknown time  . metFORMIN (GLUCOPHAGE) 500 MG tablet Take 1,000 mg by mouth 2 (two) times daily with a meal.   04/11/2015 at Unknown time  . metoprolol (LOPRESSOR) 100 MG tablet Take 100 mg by mouth every morning. & 1/2 by mouth in evening    04/11/2015 at Unknown time  . nitroGLYCERIN (NITROSTAT) 0.4 MG SL tablet Place 0.4 mg under the tongue every 5 (five) minutes as needed.   unknown  . oxyCODONE-acetaminophen (PERCOCET) 7.5-325 MG tablet Take 1 tablet by mouth every 4 (four) hours as needed for severe pain (4-6 hours).   unknown  . tiZANidine (ZANAFLEX) 4 MG tablet Take 4 mg by mouth 2 (two) times daily.    04/11/2015 at Unknown time  . traZODone (DESYREL) 100 MG tablet Take 100 mg by mouth at bedtime.   04/10/2015 at Unknown time  . gabapentin (NEURONTIN) 300 MG  capsule Take 300 mg by mouth 3 (three) times daily.   Taking   Scheduled: . antiseptic oral rinse  7 mL Mouth Rinse QID  . azithromycin  500 mg Intravenous Q24H  . cefTRIAXone (ROCEPHIN)  IV  1 g Intravenous Q24H  . chlorhexidine gluconate  15 mL Mouth Rinse BID  . enoxaparin (LOVENOX) injection  40 mg Subcutaneous Q24H  . feeding supplement (VITAL HIGH PROTEIN)  1,000 mL Per Tube Q24H  . furosemide  40 mg Intravenous Daily  . insulin aspart  0-15 Units Subcutaneous 6 times per day  . ipratropium-albuterol  3 mL Nebulization Q6H  . methylPREDNISolone (SOLU-MEDROL) injection  40 mg Intravenous Q12H  . metoprolol  5 mg Intravenous 4 times per day  . pantoprazole (PROTONIX) IV  40 mg Intravenous Daily  . sodium chloride  3 mL Intravenous Q12H   Continuous: . propofol (DIPRIVAN) infusion 50 mcg/kg/min (04/15/15 0600)   FVW:AQLRJPVG (SUBLIMAZE) injection, fentaNYL (SUBLIMAZE) injection, nitroGLYCERIN, ondansetron **OR** ondansetron (ZOFRAN) IV  Assesment: He has acute hypercapnic hypoxic respiratory failure requiring ventilator support. This is a combination of COPD exacerbation and community-acquired pneumonia and heart failure. He has a pleural effusion probably parapneumonic. He is slowly improving. His oxygenation is getting a little better. He is still on too much oxygen to make a serious attempt at weaning. Principal Problem:   Ventilator dependence  ([place]) Active Problems:   Diabetes ([place])   TOBACCO USER   Essential hypertension   CORONARY ATHEROSCLEROSIS NATIVE CORONARY ARTERY   Pleural effusion, right   Acute respiratory failure with hypercapnia ([facility])   COPD with acute exacerbation ([facility])   Diastolic CHF, acute on chronic ([facility])   CAP (community acquired pneumonia)    Plan: Continue current treatments. No weaning attempt today    LOS: 4 days   Nylan Nakatani L 04/15/2015, 8:29 AM

## 2015-04-15 NOTE — Progress Notes (Signed)
Primary cardiologist: Dr. Jonelle SidleSamuel G. Kensly Bowmer  Seen for followup: CHF, CAD  Subjective:    Sedated on the ventilator.  Objective:   Temp:  [97.4 F (36.3 C)-98.1 F (36.7 C)] 98 F (36.7 C) (12/19 0400) Pulse Rate:  [40-77] 64 (12/19 0600) Resp:  [10-26] 14 (12/19 0600) BP: (94-121)/(54-95) 111/65 mmHg (12/19 0600) SpO2:  [88 %-100 %] 91 % (12/19 0600) FiO2 (%):  [55 %-60 %] 60 % (12/19 0400) Weight:  [185 lb 6.5 oz (84.1 kg)] 185 lb 6.5 oz (84.1 kg) (12/19 0500) Last BM Date: 04/11/15  Filed Weights   04/13/15 0508 04/14/15 0400 04/15/15 0500  Weight: 189 lb 13.1 oz (86.1 kg) 188 lb 11.4 oz (85.6 kg) 185 lb 6.5 oz (84.1 kg)    Intake/Output Summary (Last 24 hours) at 04/15/15 0812 Last data filed at 04/15/15 0600  Gross per 24 hour  Intake 1618.4 ml  Output   3475 ml  Net -1856.6 ml    Telemetry: Sinus rhythm, rare PVCs.  Exam:  General: Elderly male, sedated on the ventilator.  HEENT: ET tube and feeding tube in place.  Lungs: Diminished breath sounds, particular at the bases, no wheezes.  Cardiac: RRR without gallop or rub.  Abdomen: Diminished bowel sounds, nontender.  Extremities: No pitting edema, distal pulses full.  Lab Results:  Basic Metabolic Panel:  Recent Labs Lab 04/12/15 0449 04/13/15 0420 04/14/15 0455  NA 142 141 142  K 5.1 4.6 4.3  CL 100* 100* 105  CO2 34* 31 25  GLUCOSE 161* 120* 82  BUN 22* 34* 43*  CREATININE 1.44* 1.65* 1.58*  CALCIUM 9.2 8.8* 8.9    Liver Function Tests:  Recent Labs Lab 04/11/15 1812 04/12/15 0449  AST 15 12*  ALT 8* 7*  ALKPHOS 60 53  BILITOT 1.1 1.0  PROT 7.9 6.9  ALBUMIN 4.0 3.4*    CBC:  Recent Labs Lab 04/12/15 0449 04/13/15 0420 04/14/15 0455  WBC 4.2 7.3 6.6  HGB 11.8* 11.0* 12.4*  HCT 44.4 39.1 41.7  MCV 94.9 90.1 88.0  PLT 180 188 144*    Cardiac Enzymes:  Recent Labs Lab 04/11/15 1812  TROPONINI 0.05*   Echocardiogram 04/12/2015: Study Conclusions  - Left  ventricle: The cavity size was normal. Systolic function was normal. The estimated ejection fraction was in the range of 55% to 60%. Wall motion was normal; there were no regional wall motion abnormalities. Doppler parameters are consistent with abnormal left ventricular relaxation (grade 1 diastolic dysfunction). Mild basal septal hypertrophy. - Ventricular septum: Septal motion showed abnormal function and dyssynergy. These changes are consistent with intraventricular conduction delay. - Aortic valve: Moderately to severely calcified annulus. Trileaflet; mildly thickened, mildly calcified leaflets. 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 Peak velocity (S): 247 cm/s. Mean gradient (S): 12 mm Hg. Valve area (Vmean): 1.45 cm^2. - Mitral valve: Calcified annulus. Mildly thickened leaflets . - Left atrium: The atrium was mildly to moderately dilated. - Right ventricle: The cavity size was moderately dilated. Systolic function was moderately reduced. - Tricuspid valve: There was mild regurgitation. - Systemic veins: Unable to accurately assess respiratory variation as patient is ventilated.  Impressions:  - When compared to the study report dated 02/08/15, there have been no significant changes.  Chest x-ray 04/15/2015: FINDINGS: Endotracheal tube and right PICC line stable position PICC line in stable position. Prior CABG. Cardiomegaly with pulmonary vascular prominence and interstitial prominence suggesting mild congestive heart failure. Persistent right mid and lower lung infiltrate. Small bilateral pleural  effusions. No pneumothorax .  IMPRESSION: 1. Endotracheal tube and right PICC line in stable position. 2. Prior CABG. Stable cardiomegaly. Mild pulmonary vascular prominence interstitial prominence noted suggesting mild congestive heart failure. Small bilateral pleural effusions noted. 3. Set right mid lung and lower lung infiltrate.   Medications:   Scheduled  Medications: . antiseptic oral rinse  7 mL Mouth Rinse QID  . azithromycin  500 mg Intravenous Q24H  . cefTRIAXone (ROCEPHIN)  IV  1 g Intravenous Q24H  . chlorhexidine gluconate  15 mL Mouth Rinse BID  . enoxaparin (LOVENOX) injection  40 mg Subcutaneous Q24H  . feeding supplement (VITAL HIGH PROTEIN)  1,000 mL Per Tube Q24H  . furosemide  40 mg Intravenous Daily  . insulin aspart  0-15 Units Subcutaneous 6 times per day  . ipratropium-albuterol  3 mL Nebulization Q6H  . methylPREDNISolone (SOLU-MEDROL) injection  40 mg Intravenous Q12H  . metoprolol  5 mg Intravenous 4 times per day  . pantoprazole (PROTONIX) IV  40 mg Intravenous Daily  . sodium chloride  3 mL Intravenous Q12H     Infusions: . propofol (DIPRIVAN) infusion 50 mcg/kg/min (04/15/15 0600)     PRN Medications:  fentaNYL (SUBLIMAZE) injection, fentaNYL (SUBLIMAZE) injection, nitroGLYCERIN, ondansetron **OR** ondansetron (ZOFRAN) IV   Assessment:   1. Acute on chronic diastolic heart failure contributing at least partially to current presentation. Echocardiogram demonstrates LVEF 55-60% with only grade 1 diastolic dysfunction. He also has right ventricular dysfunction. 1800 cc out more than an last 24 hours.  2. Acute hypoxic respiratory failure, currently on the ventilator. Being managed for community-acquired pneumonia with small pleural effusions. On broad-spectrum antibiotics per primary team, Dr. Juanetta Gosling also the following.  3. CAD status post CABG. No definite ACS by troponin I or ECG. LVEF stable.  4. Acute on chronic renal insufficiency, CKD stage 3 at baseline. Recent creatinine 1.58.   Plan/Discussion:    Continue supportive measures, wean from ventilator as tolerated. He is on IV Lopressor and Lasix, also Lovenox. Good diuresis noted yesterday, can advance Lasix if needed, but will continue current dose for now. Renal function has been relatively stable.   Jonelle Sidle, M.D., F.A.C.C.

## 2015-04-15 NOTE — Progress Notes (Signed)
Initial Nutrition Assessment  INTERVENTION:  Decrease Vital HP from 40 ml/hr to 10 ml/hr via OG tube and add  60 ml Prostat TID.  Tube feeding regimen provides 840 kcal, 111 grams of protein, and 200 ml of H2O.     Feeding including propofol (lipids) will provide 1518 kcal (103% energy) and( 97% protein) needs.   NUTRITION DIAGNOSIS:   Inadequate oral intake related to inability to eat as evidenced by NPO status.  GOAL:   Provide needs based on ASPEN/SCCM guidelines   MONITOR:   Vent status, Labs, Weight trends  REASON FOR ASSESSMENT:   Ventilator/ Consult to manage tube feeding    ASSESSMENT:  Patient remains intubated on ventilator support due to acute respiratory failure. He has hx of COPD, CHF, DM and CAD. Last BM 12/15???   MV: 7.7 L/min Temp (24hrs), Avg:97.8 F (36.6 C), Min:97.4 F (36.3 C), Max:98.1 F (36.7 C)  Propofol: 25.7 ml/hr -providing 678 kcal lipids at current rate every 24 hr Labs: glucose 126, BUN 43, and Cr 1.58     Diet Order:  Diet NPO time specified  Skin:   intact  Last BM:   12/15  Height:   Ht Readings from Last 1 Encounters:  04/15/15 5\' 6"  (1.676 m)    Weight:   Wt Readings from Last 1 Encounters:  04/15/15 185 lb 6.5 oz (84.1 kg)    Ideal Body Weight:  67 kg  BMI:  Body mass index is 29.94 kg/(m^2).  Estimated Nutritional Needs:   Kcal:  1472   Protein:  115-129 gr  Fluid:  Negative fluid balance per MD fluid goal   EDUCATION NEEDS:   No education needs identified at this time  Royann ShiversLynn Marshe Shrestha MS,RD,CSG,LDN Office: #[phone] Pager: [phone]

## 2015-04-16 ENCOUNTER — Inpatient Hospital Stay (HOSPITAL_COMMUNITY): Payer: Medicare HMO

## 2015-04-16 DIAGNOSIS — Z9911 Dependence on respirator [ventilator] status: Secondary | ICD-10-CM

## 2015-04-16 DIAGNOSIS — J441 Chronic obstructive pulmonary disease with (acute) exacerbation: Secondary | ICD-10-CM

## 2015-04-16 LAB — BLOOD GAS, ARTERIAL
Acid-Base Excess: 5.8 mmol/L — ABNORMAL HIGH (ref 0.0–2.0)
Bicarbonate: 29.1 mEq/L — ABNORMAL HIGH (ref 20.0–24.0)
DRAWN BY: 317771
FIO2: 0.8
MECHVT: 540 mL
O2 Saturation: 91.2 %
PEEP: 5 cmH2O
Patient temperature: 98.7
RATE: 14 resp/min
pCO2 arterial: 44.5 mmHg (ref 35.0–45.0)
pH, Arterial: 7.443 (ref 7.350–7.450)
pO2, Arterial: 68.1 mmHg — ABNORMAL LOW (ref 80.0–100.0)

## 2015-04-16 LAB — BASIC METABOLIC PANEL
ANION GAP: 11 (ref 5–15)
BUN: 57 mg/dL — ABNORMAL HIGH (ref 6–20)
CHLORIDE: 98 mmol/L — AB (ref 101–111)
CO2: 32 mmol/L (ref 22–32)
CREATININE: 1.39 mg/dL — AB (ref 0.61–1.24)
Calcium: 8.6 mg/dL — ABNORMAL LOW (ref 8.9–10.3)
GFR calc non Af Amer: 49 mL/min — ABNORMAL LOW (ref >60)
GFR, EST AFRICAN AMERICAN: 56 mL/min — AB (ref >60)
Glucose, Bld: 153 mg/dL — ABNORMAL HIGH (ref 65–99)
POTASSIUM: 4.5 mmol/L (ref 3.5–5.1)
SODIUM: 141 mmol/L (ref 135–145)

## 2015-04-16 LAB — GLUCOSE, CAPILLARY
GLUCOSE-CAPILLARY: 127 mg/dL — AB (ref 65–99)
GLUCOSE-CAPILLARY: 143 mg/dL — AB (ref 65–99)
Glucose-Capillary: 119 mg/dL — ABNORMAL HIGH (ref 65–99)
Glucose-Capillary: 120 mg/dL — ABNORMAL HIGH (ref 65–99)
Glucose-Capillary: 130 mg/dL — ABNORMAL HIGH (ref 65–99)
Glucose-Capillary: 140 mg/dL — ABNORMAL HIGH (ref 65–99)

## 2015-04-16 LAB — CBC
HEMATOCRIT: 40.5 % (ref 39.0–52.0)
HEMOGLOBIN: 11.3 g/dL — AB (ref 13.0–17.0)
MCH: 24.8 pg — ABNORMAL LOW (ref 26.0–34.0)
MCHC: 27.9 g/dL — ABNORMAL LOW (ref 30.0–36.0)
MCV: 88.8 fL (ref 78.0–100.0)
PLATELETS: 149 10*3/uL — AB (ref 150–400)
RBC: 4.56 MIL/uL (ref 4.22–5.81)
RDW: 19 % — ABNORMAL HIGH (ref 11.5–15.5)
WBC: 4.9 10*3/uL (ref 4.0–10.5)

## 2015-04-16 NOTE — Progress Notes (Signed)
[facility] PROGRESS NOTE  Miguel Mendoza:[id number] DOB: 1940/09/12 DOA: 04/11/2015 PCP: [name], MD  Assessment/Plan: Acute hypoxemic and hypercarbic respiratory failure -Etiology likely multifactorial: COPD, right greater than left pleural effusion, CHF,  pneumonia. -Was intubated 12/16 due to progression of respiratory failure on BiPAP. -CT scan of the chest performed 12/16 show: Interval increase in volume of right pleural effusion with complete consolidation of the right lower lobe. New posterior medial consolidation of the left lower lobe.  -Please see below for further details. -Appreciate pulmonary consultation. -Increased oxygen requirements to 80%. No weaning attempts today. -CXR pending. -Poor prognosis.  Right greater than left pleural effusion -Etiology remains unclear to me at present. May represent CHF although may also be parapneumonic. -Thoracentesis was requested, however as per radiology there was not sufficient fluid for drainage. Effusion however appears quite large on CT chest, may be of benefit to try and reattempt thoracentesis at a later date.  Potential community-acquired pneumonia -Agree with Rocephin and azithromycin. -Blood cx remain negative to date. Sputum cx pending.  COPD with acute exacerbation -Continue steroids/nebs.  Diabetes mellitus -Fair control, continue sliding scale.  Nutrition -Started on tube feeds.  Acute on chronic diastolic CHF -Per history EF appears to be 50-60%. -2-D echo: EF 55-60%, grade 1 diastolic dysfunction. -Agree with IV Lasix, strive for negative fluid balance. (1.7 L negative since admission). -Appreciate cardiology consultation.  History of coronary artery disease -Appears stable at this venue.  Elevated troponin -Mild elevation to 0.05. Likely represents demand ischemia given complexity of overall illness and respiratory failure. -2-D echo: Ejection fraction of 55-60%, normal wall motion,  grade 1 diastolic dysfunction. -No further workup planned for at present.  Acute renal failure -Improving. -Related to acute illness, ATN. -Monitor renal function. -May worsen with diuresis given acute CHF.   Code Status: Full code Family Communication:Wife Jackie at bedside 12/18 and updated on plan of care and all questions answered. Disposition Plan: Keep in ICU.   Consultants:  Pulmonary  Cardiology   Antibiotics:  Rocephin  Azythromycin  Subjective: Sedated on vent  Objective: Filed Vitals:   04/16/15 0400 04/16/15 0500 04/16/15 0600 04/16/15 0820  BP: 122/64 116/67 115/62   Pulse: 67 59 59   Temp: 98.5 F (36.9 C)   97.5 F (36.4 C)  TempSrc: Axillary   Axillary  Resp: Height:      Weight:  83.6 kg (184 lb 4.9 oz)    SpO2: 93% 93% 92%     Intake/Output Summary (Last 24 hours) at 04/16/15 0841 Last data filed at 04/16/15 0600  Gross per 24 hour  Intake 1889.8 ml  Output   2700 ml  Net -810.2 ml   Filed Weights   04/14/15 0400 04/15/15 0500 04/16/15 0500  Weight: 85.6 kg (188 lb 11.4 oz) 84.1 kg (185 lb 6.5 oz) 83.6 kg (184 lb 4.9 oz)    Exam:   General:  Sedated  Cardiovascular: Regular rate and rhythm  Respiratory: Decreased breath sounds bilateral bases  Abdomen: Soft, nontender, nondistended  Extremities: Trace bilateral edema   Neurologic:  Unable to assess given current mental state  Data Reviewed: Basic Metabolic Panel:  Recent Labs Lab 04/11/15 1812 04/12/15 0449 04/13/15 0420 04/14/15 0455 04/16/15 0441  NA 143 142 141 142 141  K 4.6 5.1 4.6 4.3 4.5  CL 99* 100* 100* 105 98*  CO2 35* 34* 31 25 32  GLUCOSE 114* 161* 120* 82 153*  BUN 20 22*  34* 43* 57*  CREATININE 1.35* 1.44* 1.65* 1.58* 1.39*  CALCIUM 9.5 9.2 8.8* 8.9 8.6*   Liver Function Tests:  Recent Labs Lab 04/11/15 1812 04/12/15 0449  AST 15 12*  ALT 8* 7*  ALKPHOS 60 53  BILITOT 1.1 1.0  PROT 7.9 6.9  ALBUMIN 4.0 3.4*   No results  for input(s): LIPASE, AMYLASE in the last 168 hours. No results for input(s): AMMONIA in the last 168 hours. CBC:  Recent Labs Lab 04/11/15 1812 04/12/15 0449 04/13/15 0420 04/14/15 0455 04/16/15 0441  WBC 6.5 4.2 7.3 6.6 4.9  NEUTROABS 4.7  --   --   --   --   HGB 12.8* 11.8* 11.0* 12.4* 11.3*  HCT 47.3 44.4 39.1 41.7 40.5  MCV 95.2 94.9 90.1 88.0 88.8  PLT 192 180 188 144* 149*   Cardiac Enzymes:  Recent Labs Lab 04/11/15 1812  TROPONINI 0.05*   BNP (last 3 results)  Recent Labs  04/11/15 1812 04/12/15 0449  BNP 422.0* 520.0*    ProBNP (last 3 results) No results for input(s): PROBNP in the last 8760 hours.  CBG:  Recent Labs Lab 04/15/15 1136 04/15/15 1947 04/16/15 0004 04/16/15 0348 04/16/15 0757  GLUCAP 143* 120* 130* 140* 127*    Recent Results (from the past 240 hour(s))  MRSA PCR Screening     Status: None   Collection Time: 04/11/15 11:42 AM  Result Value Ref Range Status   MRSA by PCR NEGATIVE NEGATIVE Final    Comment:        The GeneXpert MRSA Assay (FDA approved for NASAL specimens only), is one component of a comprehensive MRSA colonization surveillance program. It is not intended to diagnose MRSA infection nor to guide or monitor treatment for MRSA infections.   Culture, blood (Routine X 2) w Reflex to ID Panel     Status: None (Preliminary result)   Collection Time: 04/13/15 10:02 AM  Result Value Ref Range Status   Specimen Description BLOOD LEFT ARM  Final   Special Requests BOTTLES DRAWN AEROBIC AND ANAEROBIC 6CC EACH  Final   Culture NO GROWTH 2 DAYS  Final   Report Status PENDING  Incomplete  Culture, blood (Routine X 2) w Reflex to ID Panel     Status: None (Preliminary result)   Collection Time: 04/13/15 10:08 AM  Result Value Ref Range Status   Specimen Description BLOOD LEFT HAND  Final   Special Requests BOTTLES DRAWN AEROBIC AND ANAEROBIC 5CC EACH  Final   Culture NO GROWTH 2 DAYS  Final   Report Status PENDING   Incomplete  Culture, respiratory (NON-Expectorated)     Status: None (Preliminary result)   Collection Time: 04/14/15  9:32 PM  Result Value Ref Range Status   Specimen Description ENDOTRACHEAL  Final   Special Requests NONE  Final   Gram Stain   Final    MODERATE WBC PRESENT, PREDOMINANTLY PMN NO SQUAMOUS EPITHELIAL CELLS SEEN MODERATE GRAM POSITIVE RODS THIS SPECIMEN IS ACCEPTABLE FOR SPUTUM CULTURE Performed at [facility]    Culture PENDING  Incomplete   Report Status PENDING  Incomplete  Culture, expectorated sputum-assessment     Status: None   Collection Time: 04/14/15  9:48 PM  Result Value Ref Range Status   Specimen Description ENDOTRACHEAL  Final   Special Requests NONE  Final   Sputum evaluation THIS SPECIMEN IS ACCEPTABLE FOR SPUTUM CULTURE  Final   Report Status 04/14/2015 FINAL  Final     Studies: Dg  Chest Port 1 View  04/15/2015  CLINICAL DATA:  Shortness of breath. EXAM: PORTABLE CHEST 1 VIEW COMPARISON:  04/14/2015.  CT 04/12/2015. FINDINGS: Endotracheal tube and right PICC line stable position PICC line in stable position. Prior CABG. Cardiomegaly with pulmonary vascular prominence and interstitial prominence suggesting mild congestive heart failure. Persistent right mid and lower lung infiltrate. Small bilateral pleural effusions. No pneumothorax . IMPRESSION: 1. Endotracheal tube and right PICC line in stable position. 2. Prior CABG. Stable cardiomegaly. Mild pulmonary vascular prominence interstitial prominence noted suggesting mild congestive heart failure. Small bilateral pleural effusions noted. 3. Set right mid lung and lower lung infiltrate. Electronically Signed   By: Maisie Fus  Register   On: 04/15/2015 07:43   Dg Chest Port 1 View  04/14/2015  CLINICAL DATA:  Acute respiratory failure, disorientation. EXAM: PORTABLE CHEST 1 VIEW COMPARISON:  Chest x-ray and chest CT dated 04/12/2015, chest x-ray dated 04/11/2015 and chest x-ray dated 02/07/2015.  FINDINGS: Endotracheal tube appears well positioned with tip just above the level of the carina. Enteric tube passes below the diaphragm. A right subclavian central line is better seen on previous chest CT, presumably obscured on this chest x-ray by the numerous lines overlying the right chest and mediastinum. Lung aeration is improved bilaterally. Suspect decreased right pleural effusion. Persistent dense opacities at each lung base compatible with the atelectasis identified on earlier chest CT. IMPRESSION: Improved aeration of both lungs indicating improved fluid status. Suspect at least some decrease in the size of the right pleural effusion. Probably stable atelectasis at each lung base. Tubes and lines grossly stable in position. Electronically Signed   By: Bary Richard M.D.   On: 04/14/2015 11:03    Scheduled Meds: . antiseptic oral rinse  7 mL Mouth Rinse QID  . azithromycin  500 mg Intravenous Q24H  . cefTRIAXone (ROCEPHIN)  IV  1 g Intravenous Q24H  . chlorhexidine gluconate  15 mL Mouth Rinse BID  . enoxaparin (LOVENOX) injection  40 mg Subcutaneous Q24H  . feeding supplement (PRO-STAT SUGAR FREE 64)  60 mL Per Tube TID  . feeding supplement (VITAL HIGH PROTEIN)  1,000 mL Per Tube Q24H  . furosemide  40 mg Intravenous Daily  . insulin aspart  0-15 Units Subcutaneous 6 times per day  . ipratropium-albuterol  3 mL Nebulization Q6H  . methylPREDNISolone (SOLU-MEDROL) injection  40 mg Intravenous Q12H  . metoprolol  5 mg Intravenous 4 times per day  . pantoprazole (PROTONIX) IV  40 mg Intravenous Daily  . sodium chloride  3 mL Intravenous Q12H   Continuous Infusions: . propofol (DIPRIVAN) infusion 50 mcg/kg/min (04/16/15 0740)    Principal Problem:   Ventilator dependence (HCC) Active Problems:   Acute respiratory failure with hypercapnia (HCC)   Diabetes (HCC)   TOBACCO USER   Essential hypertension   CORONARY ATHEROSCLEROSIS NATIVE CORONARY ARTERY   Pleural effusion, right    COPD with acute exacerbation (HCC)   Diastolic CHF, acute on chronic (HCC)   CAP (community acquired pneumonia)    Critical care time spent: 25 minutes. Greater than 50% of this time was spent in direct contact with the patient coordinating care.    Chaya Jan  [facility] Pager [phone]  If 7PM-7AM, please contact night-coverage at [email or web address], password [facility] 04/16/2015, 8:41 AM  LOS: 5 days

## 2015-04-16 NOTE — Progress Notes (Signed)
Subjective: He remains intubated and on the ventilator. He is requiring higher FiO2 and is now up to 80% with a PO2 in the 60s. Chest x-ray is pending.  Objective: Vital signs in last 24 hours: Temp:  [97.7 F (36.5 C)-98.7 F (37.1 C)] 98.5 F (36.9 C) (12/20 0400) Pulse Rate:  [54-67] 59 (12/20 0600) Resp:  [13-19] 16 (12/20 0600) BP: (94-122)/(52-72) 115/62 mmHg (12/20 0600) SpO2:  [88 %-93 %] 92 % (12/20 0600) FiO2 (%):  [60 %-80 %] 80 % (12/20 0400) Weight:  [83.6 kg (184 lb 4.9 oz)] 83.6 kg (184 lb 4.9 oz) (12/20 0500) Weight change: -0.5 kg (-1 lb 1.6 oz) Last BM Date: 04/15/15  Intake/Output from previous day: 12/19 0701 - 12/20 0700 In: 1889.8 [I.V.:619.8; NG/GT:1020; IV Piggyback:250] Out: 2700 [Urine:2700]  PHYSICAL EXAM General appearance: Intubated sedated on mechanical ventilation Resp: rhonchi bilaterally Cardio: regular rate and rhythm, S1, S2 normal, no murmur, click, rub or gallop GI: soft, non-tender; bowel sounds normal; no masses,  no organomegaly Extremities: extremities normal, atraumatic, no cyanosis or edema  Lab Results:  Results for orders placed or performed during the hospital encounter of 04/11/15 (from the past 48 hour(s))  Glucose, capillary     Status: Abnormal   Collection Time: 04/14/15 11:40 AM  Result Value Ref Range   Glucose-Capillary 121 (H) 65 - 99 mg/dL  Glucose, capillary     Status: Abnormal   Collection Time: 04/14/15  4:16 PM  Result Value Ref Range   Glucose-Capillary 138 (H) 65 - 99 mg/dL   Comment 1 Notify RN    Comment 2 Document in Chart   Glucose, capillary     Status: Abnormal   Collection Time: 04/14/15  7:41 PM  Result Value Ref Range   Glucose-Capillary 115 (H) 65 - 99 mg/dL   Comment 1 Notify RN    Comment 2 Document in Chart   Culture, respiratory (NON-Expectorated)     Status: None (Preliminary result)   Collection Time: 04/14/15  9:32 PM  Result Value Ref Range   Specimen Description ENDOTRACHEAL    Special Requests NONE    Gram Stain      MODERATE WBC PRESENT, PREDOMINANTLY PMN NO SQUAMOUS EPITHELIAL CELLS SEEN MODERATE GRAM POSITIVE RODS THIS SPECIMEN IS ACCEPTABLE FOR SPUTUM CULTURE Performed at [facility]    Culture PENDING    Report Status PENDING   Culture, expectorated sputum-assessment     Status: None   Collection Time: 04/14/15  9:48 PM  Result Value Ref Range   Specimen Description ENDOTRACHEAL    Special Requests NONE    Sputum evaluation THIS SPECIMEN IS ACCEPTABLE FOR SPUTUM CULTURE    Report Status 04/14/2015 FINAL   Glucose, capillary     Status: Abnormal   Collection Time: 04/14/15 11:46 PM  Result Value Ref Range   Glucose-Capillary 149 (H) 65 - 99 mg/dL  Blood gas, arterial     Status: Abnormal   Collection Time: 04/15/15  4:00 AM  Result Value Ref Range   FIO2 0.60    Delivery systems VENTILATOR    Mode PRESSURE REGULATED VOLUME CONTROL    VT 540 mL   LHR 14 resp/min   Peep/cpap 5.0 cm H20   pH, Arterial 7.452 (H) 7.350 - 7.450   pCO2 arterial 45.1 (H) 35.0 - 45.0 mmHg   pO2, Arterial 68.4 (L) 80.0 - 100.0 mmHg   Bicarbonate 30.4 (H) 20.0 - 24.0 mEq/L   Acid-Base Excess 7.0 (H) 0.0 - 2.0 mmol/L  O2 Saturation 91.5 %   Patient temperature 98.6    Collection site RADIAL    Drawn by [id number]    Sample type ARTERIAL    Allens test (pass/fail) PASS PASS  Glucose, capillary     Status: Abnormal   Collection Time: 04/15/15  5:12 AM  Result Value Ref Range   Glucose-Capillary 135 (H) 65 - 99 mg/dL  Glucose, capillary     Status: Abnormal   Collection Time: 04/15/15  7:19 AM  Result Value Ref Range   Glucose-Capillary 115 (H) 65 - 99 mg/dL   Comment 1 Notify RN    Comment 2 Document in Chart   Triglycerides     Status: None   Collection Time: 04/15/15  9:37 AM  Result Value Ref Range   Triglycerides 126 <150 mg/dL  Glucose, capillary     Status: Abnormal   Collection Time: 04/15/15 11:36 AM  Result Value Ref Range   Glucose-Capillary  143 (H) 65 - 99 mg/dL  Glucose, capillary     Status: Abnormal   Collection Time: 04/15/15  7:47 PM  Result Value Ref Range   Glucose-Capillary 120 (H) 65 - 99 mg/dL   Comment 1 Notify RN   Glucose, capillary     Status: Abnormal   Collection Time: 04/16/15 12:04 AM  Result Value Ref Range   Glucose-Capillary 130 (H) 65 - 99 mg/dL   Comment 1 Notify RN   Glucose, capillary     Status: Abnormal   Collection Time: 04/16/15  3:48 AM  Result Value Ref Range   Glucose-Capillary 140 (H) 65 - 99 mg/dL   Comment 1 Notify RN   Blood gas, arterial     Status: Abnormal   Collection Time: 04/16/15  4:10 AM  Result Value Ref Range   FIO2 0.80    Delivery systems VENTILATOR    Mode PRESSURE REGULATED VOLUME CONTROL    VT 540 mL   LHR 14.0 resp/min   Peep/cpap 5.0 cm H20   pH, Arterial 7.443 7.350 - 7.450   pCO2 arterial 44.5 35.0 - 45.0 mmHg   pO2, Arterial 68.1 (L) 80.0 - 100.0 mmHg   Bicarbonate 29.1 (H) 20.0 - 24.0 mEq/L   Acid-Base Excess 5.8 (H) 0.0 - 2.0 mmol/L   O2 Saturation 91.2 %   Patient temperature 98.7    Collection site LEFT RADIAL    Drawn by [id number]    Sample type ARTERIAL DRAW    Allens test (pass/fail) PASS PASS  Basic metabolic panel     Status: Abnormal   Collection Time: 04/16/15  4:41 AM  Result Value Ref Range   Sodium 141 135 - 145 mmol/L   Potassium 4.5 3.5 - 5.1 mmol/L   Chloride 98 (L) 101 - 111 mmol/L   CO2 32 22 - 32 mmol/L   Glucose, Bld 153 (H) 65 - 99 mg/dL   BUN 57 (H) 6 - 20 mg/dL   Creatinine, Ser 1.39 (H) 0.61 - 1.24 mg/dL   Calcium 8.6 (L) 8.9 - 10.3 mg/dL   GFR calc non Af Amer 49 (L) >60 mL/min   GFR calc Af Amer 56 (L) >60 mL/min    Comment: (NOTE) The eGFR has been calculated using the CKD EPI equation. This calculation has not been validated in all clinical situations. eGFR's persistently <60 mL/min signify possible Chronic Kidney Disease.    Anion gap 11 5 - 15  CBC     Status: Abnormal   Collection Time: 04/16/15  4:41 AM  Result  Value Ref Range   WBC 4.9 4.0 - 10.5 K/uL   RBC 4.56 4.22 - 5.81 MIL/uL   Hemoglobin 11.3 (L) 13.0 - 17.0 g/dL   HCT 40.5 39.0 - 52.0 %   MCV 88.8 78.0 - 100.0 fL   MCH 24.8 (L) 26.0 - 34.0 pg   MCHC 27.9 (L) 30.0 - 36.0 g/dL   RDW 19.0 (H) 11.5 - 15.5 %   Platelets 149 (L) 150 - 400 K/uL    ABGS  Recent Labs  04/16/15 0410  PHART 7.443  PO2ART 68.1*  HCO3 29.1*   CULTURES Recent Results (from the past 240 hour(s))  MRSA PCR Screening     Status: None   Collection Time: 04/11/15 11:42 AM  Result Value Ref Range Status   MRSA by PCR NEGATIVE NEGATIVE Final    Comment:        The GeneXpert MRSA Assay (FDA approved for NASAL specimens only), is one component of a comprehensive MRSA colonization surveillance program. It is not intended to diagnose MRSA infection nor to guide or monitor treatment for MRSA infections.   Culture, blood (Routine X 2) w Reflex to ID Panel     Status: None (Preliminary result)   Collection Time: 04/13/15 10:02 AM  Result Value Ref Range Status   Specimen Description BLOOD LEFT ARM  Final   Special Requests BOTTLES DRAWN AEROBIC AND ANAEROBIC 6CC EACH  Final   Culture NO GROWTH 2 DAYS  Final   Report Status PENDING  Incomplete  Culture, blood (Routine X 2) w Reflex to ID Panel     Status: None (Preliminary result)   Collection Time: 04/13/15 10:08 AM  Result Value Ref Range Status   Specimen Description BLOOD LEFT HAND  Final   Special Requests BOTTLES DRAWN AEROBIC AND ANAEROBIC 5CC EACH  Final   Culture NO GROWTH 2 DAYS  Final   Report Status PENDING  Incomplete  Culture, respiratory (NON-Expectorated)     Status: None (Preliminary result)   Collection Time: 04/14/15  9:32 PM  Result Value Ref Range Status   Specimen Description ENDOTRACHEAL  Final   Special Requests NONE  Final   Gram Stain   Final    MODERATE WBC PRESENT, PREDOMINANTLY PMN NO SQUAMOUS EPITHELIAL CELLS SEEN MODERATE GRAM POSITIVE RODS THIS SPECIMEN IS ACCEPTABLE  FOR SPUTUM CULTURE Performed at [facility]    Culture PENDING  Incomplete   Report Status PENDING  Incomplete  Culture, expectorated sputum-assessment     Status: None   Collection Time: 04/14/15  9:48 PM  Result Value Ref Range Status   Specimen Description ENDOTRACHEAL  Final   Special Requests NONE  Final   Sputum evaluation THIS SPECIMEN IS ACCEPTABLE FOR SPUTUM CULTURE  Final   Report Status 04/14/2015 FINAL  Final   Studies/Results: Dg Chest Port 1 View  04/15/2015  CLINICAL DATA:  Shortness of breath. EXAM: PORTABLE CHEST 1 VIEW COMPARISON:  04/14/2015.  CT 04/12/2015. FINDINGS: Endotracheal tube and right PICC line stable position PICC line in stable position. Prior CABG. Cardiomegaly with pulmonary vascular prominence and interstitial prominence suggesting mild congestive heart failure. Persistent right mid and lower lung infiltrate. Small bilateral pleural effusions. No pneumothorax . IMPRESSION: 1. Endotracheal tube and right PICC line in stable position. 2. Prior CABG. Stable cardiomegaly. Mild pulmonary vascular prominence interstitial prominence noted suggesting mild congestive heart failure. Small bilateral pleural effusions noted. 3. Set right mid lung and lower lung infiltrate. Electronically Signed   By: Marcello Moores  Register   On: 04/15/2015 07:43   Dg Chest Port 1 View  04/14/2015  CLINICAL DATA:  Acute respiratory failure, disorientation. EXAM: PORTABLE CHEST 1 VIEW COMPARISON:  Chest x-ray and chest CT dated 04/12/2015, chest x-ray dated 04/11/2015 and chest x-ray dated 02/07/2015. FINDINGS: Endotracheal tube appears well positioned with tip just above the level of the carina. Enteric tube passes below the diaphragm. A right subclavian central line is better seen on previous chest CT, presumably obscured on this chest x-ray by the numerous lines overlying the right chest and mediastinum. Lung aeration is improved bilaterally. Suspect decreased right pleural effusion.  Persistent dense opacities at each lung base compatible with the atelectasis identified on earlier chest CT. IMPRESSION: Improved aeration of both lungs indicating improved fluid status. Suspect at least some decrease in the size of the right pleural effusion. Probably stable atelectasis at each lung base. Tubes and lines grossly stable in position. Electronically Signed   By: Franki Cabot M.D.   On: 04/14/2015 11:03    Medications:  Prior to Admission:  Prescriptions prior to admission  Medication Sig Dispense Refill Last Dose  . amLODipine (NORVASC) 10 MG tablet Take 10 mg by mouth daily.   04/11/2015 at Unknown time  . atorvastatin (LIPITOR) 20 MG tablet Take 20 mg by mouth daily.   04/11/2015 at Unknown time  . clonazePAM (KLONOPIN) 1 MG tablet Take 1 mg by mouth 2 (two) times daily.   04/11/2015 at Unknown time  . clopidogrel (PLAVIX) 75 MG tablet Take 75 mg by mouth daily.   04/11/2015 at Unknown time  . esomeprazole (NEXIUM) 40 MG capsule Take 40 mg by mouth daily before breakfast.   04/11/2015 at Unknown time  . furosemide (LASIX) 40 MG tablet Take 0.5-1 tablets (20-40 mg total) by mouth every other day. Alternated a half pill with whole pill every other day (Patient taking differently: Take 40 mg by mouth daily. ) 115 tablet 3 unknown  . glimepiride (AMARYL) 2 MG tablet Take 2 mg by mouth daily.    04/11/2015 at Unknown time  . isosorbide mononitrate (IMDUR) 30 MG 24 hr tablet Take 30 mg by mouth daily.   04/11/2015 at Unknown time  . lisinopril (PRINIVIL,ZESTRIL) 2.5 MG tablet Take 2.5 mg by mouth daily.   04/11/2015 at Unknown time  . metFORMIN (GLUCOPHAGE) 500 MG tablet Take 1,000 mg by mouth 2 (two) times daily with a meal.   04/11/2015 at Unknown time  . metoprolol (LOPRESSOR) 100 MG tablet Take 100 mg by mouth every morning. & 1/2 by mouth in evening   04/11/2015 at Unknown time  . nitroGLYCERIN (NITROSTAT) 0.4 MG SL tablet Place 0.4 mg under the tongue every 5 (five) minutes as  needed.   unknown  . oxyCODONE-acetaminophen (PERCOCET) 7.5-325 MG tablet Take 1 tablet by mouth every 4 (four) hours as needed for severe pain (4-6 hours).   unknown  . tiZANidine (ZANAFLEX) 4 MG tablet Take 4 mg by mouth 2 (two) times daily.    04/11/2015 at Unknown time  . traZODone (DESYREL) 100 MG tablet Take 100 mg by mouth at bedtime.   04/10/2015 at Unknown time  . gabapentin (NEURONTIN) 300 MG capsule Take 300 mg by mouth 3 (three) times daily.   Taking   Scheduled: . antiseptic oral rinse  7 mL Mouth Rinse QID  . azithromycin  500 mg Intravenous Q24H  . cefTRIAXone (ROCEPHIN)  IV  1 g Intravenous Q24H  . chlorhexidine gluconate  15 mL Mouth Rinse  BID  . enoxaparin (LOVENOX) injection  40 mg Subcutaneous Q24H  . feeding supplement (PRO-STAT SUGAR FREE 64)  60 mL Per Tube TID  . feeding supplement (VITAL HIGH PROTEIN)  1,000 mL Per Tube Q24H  . furosemide  40 mg Intravenous Daily  . insulin aspart  0-15 Units Subcutaneous 6 times per day  . ipratropium-albuterol  3 mL Nebulization Q6H  . methylPREDNISolone (SOLU-MEDROL) injection  40 mg Intravenous Q12H  . metoprolol  5 mg Intravenous 4 times per day  . pantoprazole (PROTONIX) IV  40 mg Intravenous Daily  . sodium chloride  3 mL Intravenous Q12H   Continuous: . propofol (DIPRIVAN) infusion 50 mcg/kg/min (04/16/15 0600)   JAS:[id number] (SUBLIMAZE) injection, fentaNYL (SUBLIMAZE) injection, nitroGLYCERIN, ondansetron **OR** ondansetron (ZOFRAN) IV  Assesment: He has acute hypoxic/hypercapnic respiratory failure. This is related to a combination of problems including community-acquired pneumonia, COPD exacerbation and diastolic heart failure. Chest x-ray yesterday showed pneumonia stable, pleural effusions bilaterally which are small and some element of heart failure. He is negative about 1.7 L since admission. Principal Problem:   Ventilator dependence ([place]) Active Problems:   Diabetes ([place])   TOBACCO USER   Essential  hypertension   CORONARY ATHEROSCLEROSIS NATIVE CORONARY ARTERY   Pleural effusion, right   Acute respiratory failure with hypercapnia (HCC)   COPD with acute exacerbation (HCC)   Diastolic CHF, acute on chronic (HCC)   CAP (community acquired pneumonia)    Plan: Continue ventilator support. He has required increased FiO2. I will review chest x-ray from today.    LOS: 5 days   Kimiko Common L 04/16/2015, 7:40 AM

## 2015-04-16 NOTE — Progress Notes (Signed)
Primary cardiologist: Dr. Jonelle Sidle  Seen for followup: CHF, CAD  Subjective:    Sedated on the ventilator. Still requiring high FiO2.  Objective:   Temp:  [97.5 F (36.4 C)-98.7 F (37.1 C)] 97.5 F (36.4 C) (12/20 0820) Pulse Rate:  [54-67] 59 (12/20 0600) Resp:  [13-19] 16 (12/20 0600) BP: (94-122)/(52-72) 115/62 mmHg (12/20 0600) SpO2:  [88 %-93 %] 92 % (12/20 0600) FiO2 (%):  [60 %-80 %] 80 % (12/20 0755) Weight:  [184 lb 4.9 oz (83.6 kg)] 184 lb 4.9 oz (83.6 kg) (12/20 0500) Last BM Date: 04/15/15  Filed Weights   04/14/15 0400 04/15/15 0500 04/16/15 0500  Weight: 188 lb 11.4 oz (85.6 kg) 185 lb 6.5 oz (84.1 kg) 184 lb 4.9 oz (83.6 kg)    Intake/Output Summary (Last 24 hours) at 04/16/15 0981 Last data filed at 04/16/15 0600  Gross per 24 hour  Intake 1889.8 ml  Output   2700 ml  Net -810.2 ml    Telemetry: Sinus rhythm, rare PVCs.  Exam:  General: Elderly male, sedated on the ventilator.  HEENT: ET tube and feeding tube in place.  Lungs: Diminished breath sounds, particular at the bases, no wheezes.  Cardiac: RRR without gallop or rub.  Abdomen: Diminished bowel sounds, nontender.  Extremities: No pitting edema, distal pulses full.  Lab Results:  Basic Metabolic Panel:  Recent Labs Lab 04/13/15 0420 04/14/15 0455 04/16/15 0441  NA 141 142 141  K 4.6 4.3 4.5  CL 100* 105 98*  CO2 31 25 32  GLUCOSE 120* 82 153*  BUN 34* 43* 57*  CREATININE 1.65* 1.58* 1.39*  CALCIUM 8.8* 8.9 8.6*    Liver Function Tests:  Recent Labs Lab 04/11/15 1812 04/12/15 0449  AST 15 12*  ALT 8* 7*  ALKPHOS 60 53  BILITOT 1.1 1.0  PROT 7.9 6.9  ALBUMIN 4.0 3.4*    CBC:  Recent Labs Lab 04/13/15 0420 04/14/15 0455 04/16/15 0441  WBC 7.3 6.6 4.9  HGB 11.0* 12.4* 11.3*  HCT 39.1 41.7 40.5  MCV 90.1 88.0 88.8  PLT 188 144* 149*    Cardiac Enzymes:  Recent Labs Lab 04/11/15 1812  TROPONINI 0.05*   Echocardiogram  04/12/2015: Study Conclusions  - Left ventricle: The cavity size was normal. Systolic function was normal. The estimated ejection fraction was in the range of 55% to 60%. Wall motion was normal; there were no regional wall motion abnormalities. Doppler parameters are consistent with abnormal left ventricular relaxation (grade 1 diastolic dysfunction). Mild basal septal hypertrophy. - Ventricular septum: Septal motion showed abnormal function and dyssynergy. These changes are consistent with intraventricular conduction delay. - Aortic valve: Moderately to severely calcified annulus. Trileaflet; mildly thickened, mildly calcified leaflets. 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 Peak velocity (S): 247 cm/s. Mean gradient (S): 12 mm Hg. Valve area (Vmean): 1.45 cm^2. - Mitral valve: Calcified annulus. Mildly thickened leaflets . - Left atrium: The atrium was mildly to moderately dilated. - Right ventricle: The cavity size was moderately dilated. Systolic function was moderately reduced. - Tricuspid valve: There was mild regurgitation. - Systemic veins: Unable to accurately assess respiratory variation as patient is ventilated.  Impressions:  - When compared to the study report dated 02/08/15, there have been no significant changes.  Chest x-ray 04/15/2015: FINDINGS: Endotracheal tube and right PICC line stable position PICC line in stable position. Prior CABG. Cardiomegaly with pulmonary vascular prominence and interstitial prominence suggesting mild congestive heart failure. Persistent right mid and lower lung  infiltrate. Small bilateral pleural effusions. No pneumothorax .  IMPRESSION: 1. Endotracheal tube and right PICC line in stable position. 2. Prior CABG. Stable cardiomegaly. Mild pulmonary vascular prominence interstitial prominence noted suggesting mild congestive heart failure. Small bilateral pleural effusions noted. 3. Set right mid lung and lower lung  infiltrate.   Medications:   Scheduled Medications: . antiseptic oral rinse  7 mL Mouth Rinse QID  . azithromycin  500 mg Intravenous Q24H  . cefTRIAXone (ROCEPHIN)  IV  1 g Intravenous Q24H  . chlorhexidine gluconate  15 mL Mouth Rinse BID  . enoxaparin (LOVENOX) injection  40 mg Subcutaneous Q24H  . feeding supplement (PRO-STAT SUGAR FREE 64)  60 mL Per Tube TID  . feeding supplement (VITAL HIGH PROTEIN)  1,000 mL Per Tube Q24H  . furosemide  40 mg Intravenous Daily  . insulin aspart  0-15 Units Subcutaneous 6 times per day  . ipratropium-albuterol  3 mL Nebulization Q6H  . methylPREDNISolone (SOLU-MEDROL) injection  40 mg Intravenous Q12H  . metoprolol  5 mg Intravenous 4 times per day  . pantoprazole (PROTONIX) IV  40 mg Intravenous Daily  . sodium chloride  3 mL Intravenous Q12H    Infusions: . propofol (DIPRIVAN) infusion 50 mcg/kg/min (04/16/15 0740)    PRN Medications: fentaNYL (SUBLIMAZE) injection, fentaNYL (SUBLIMAZE) injection, nitroGLYCERIN, ondansetron **OR** ondansetron (ZOFRAN) IV   Assessment:   1. Acute on chronic diastolic heart failure contributing at least partially to current presentation. Echocardiogram demonstrates LVEF 55-60% with only grade 1 diastolic dysfunction. He also has right ventricular dysfunction. 2600 cc out more than in last 48 hours.  2. Acute hypoxic respiratory failure, currently on the ventilator. Being managed for community-acquired pneumonia with small pleural effusions. On broad-spectrum antibiotics per primary team, Dr. Juanetta GoslingHawkins also following.  3. CAD status post CABG. No definite ACS by troponin I or ECG. LVEF stable.  4. Acute on chronic renal insufficiency, CKD stage 3 at baseline. Recent creatinine down to 1.39.   Plan/Discussion:    Continue supportive measures. Continue IV Lopressor and Lasix, also Lovenox. Reasonable diuresis on current dose Lasix. Followup CXR today. If concerns remain about additional fluid overload  contributing to weaning from ventilator, could increase Lasix further as long as renal function remains stable.  Jonelle SidleSamuel G. McDowell, M.D., F.A.C.C.

## 2015-04-16 NOTE — Care Management Note (Signed)
Case Management Note  Patient Details  Name: York GriceMarvin C Balderston MRN: [id number] Date of Birth: 1941/04/04  Subjective/Objective:                    Action/Plan:   Expected Discharge Date:  04/15/15               Expected Discharge Plan:  Home w Home Health Services  In-House Referral:  NA  Discharge planning Services  CM Consult  Post Acute Care Choice:    Choice offered to:     DME Arranged:    DME Agency:     HH Arranged:    HH Agency:     Status of Service:  In process, will continue to follow  Medicare Important Message Given:  Yes Date Medicare IM Given:    Medicare IM give by:    Date Additional Medicare IM Given:    Additional Medicare Important Message give by:     If discussed at Long Length of Stay Meetings, dates discussed:04/16/15    Additional Comments:  Cheryl FlashBlackwell, Tahara Ruffini Crowder, RN 04/16/2015, 4:03 PM

## 2015-04-17 ENCOUNTER — Inpatient Hospital Stay (HOSPITAL_COMMUNITY): Payer: Medicare HMO

## 2015-04-17 DIAGNOSIS — I5033 Acute on chronic diastolic (congestive) heart failure: Secondary | ICD-10-CM | POA: Insufficient documentation

## 2015-04-17 LAB — BLOOD GAS, ARTERIAL
ACID-BASE EXCESS: 7.5 mmol/L — AB (ref 0.0–2.0)
BICARBONATE: 31.3 meq/L — AB (ref 20.0–24.0)
DRAWN BY: 22223
FIO2: 80
MECHVT: 540 mL
O2 SAT: 91.6 %
PATIENT TEMPERATURE: 37
PEEP: 5 cmH2O
PO2 ART: 61.4 mmHg — AB (ref 80.0–100.0)
RATE: 14 resp/min
TCO2: 14.8 mmol/L (ref 0–100)
pCO2 arterial: 37.5 mmHg (ref 35.0–45.0)
pH, Arterial: 7.524 — ABNORMAL HIGH (ref 7.350–7.450)

## 2015-04-17 LAB — CBC
HEMATOCRIT: 40.5 % (ref 39.0–52.0)
HEMOGLOBIN: 11.7 g/dL — AB (ref 13.0–17.0)
MCH: 25.5 pg — ABNORMAL LOW (ref 26.0–34.0)
MCHC: 28.9 g/dL — ABNORMAL LOW (ref 30.0–36.0)
MCV: 88.2 fL (ref 78.0–100.0)
Platelets: 136 10*3/uL — ABNORMAL LOW (ref 150–400)
RBC: 4.59 MIL/uL (ref 4.22–5.81)
RDW: 18.7 % — ABNORMAL HIGH (ref 11.5–15.5)
WBC: 4.6 10*3/uL (ref 4.0–10.5)

## 2015-04-17 LAB — BASIC METABOLIC PANEL
ANION GAP: 13 (ref 5–15)
BUN: 57 mg/dL — ABNORMAL HIGH (ref 6–20)
CO2: 30 mmol/L (ref 22–32)
Calcium: 9 mg/dL (ref 8.9–10.3)
Chloride: 99 mmol/L — ABNORMAL LOW (ref 101–111)
Creatinine, Ser: 1.33 mg/dL — ABNORMAL HIGH (ref 0.61–1.24)
GFR calc Af Amer: 60 mL/min — ABNORMAL LOW (ref >60)
GFR, EST NON AFRICAN AMERICAN: 51 mL/min — AB (ref >60)
Glucose, Bld: 132 mg/dL — ABNORMAL HIGH (ref 65–99)
POTASSIUM: 3.7 mmol/L (ref 3.5–5.1)
SODIUM: 142 mmol/L (ref 135–145)

## 2015-04-17 LAB — GLUCOSE, CAPILLARY
GLUCOSE-CAPILLARY: 120 mg/dL — AB (ref 65–99)
GLUCOSE-CAPILLARY: 119 mg/dL — AB (ref 65–99)
GLUCOSE-CAPILLARY: 126 mg/dL — AB (ref 65–99)
GLUCOSE-CAPILLARY: 111 mg/dL — AB (ref 65–99)
Glucose-Capillary: 127 mg/dL — ABNORMAL HIGH (ref 65–99)
Glucose-Capillary: 149 mg/dL — ABNORMAL HIGH (ref 65–99)

## 2015-04-17 LAB — CULTURE, RESPIRATORY: CULTURE: NORMAL

## 2015-04-17 LAB — CULTURE, RESPIRATORY W GRAM STAIN

## 2015-04-17 MED ORDER — BUMETANIDE 0.25 MG/ML IJ SOLN
2.0000 mg | Freq: Two times a day (BID) | INTRAMUSCULAR | Status: DC
  Administered 2015-04-17 – 2015-04-22 (×10): 2 mg via INTRAVENOUS
  Filled 2015-04-17 (×10): qty 8

## 2015-04-17 MED ORDER — BUMETANIDE 0.25 MG/ML IJ SOLN
1.0000 mg | Freq: Two times a day (BID) | INTRAMUSCULAR | Status: DC
  Administered 2015-04-17: 1 mg via INTRAVENOUS
  Filled 2015-04-17: qty 4

## 2015-04-17 MED ORDER — METOPROLOL TARTRATE 1 MG/ML IV SOLN: 5.0000 mg | INTRAVENOUS | Status: DC

## 2015-04-17 NOTE — Progress Notes (Signed)
Nutrition Follow-up  INTERVENTION:  Continue Vital HP @ 10 ml/hr via OG tube and  60 ml Prostat TID until pt is able to decrease his need for propofol.  Tube feeding regimen provides 840 kcal, 111 grams of protein, and 200 ml of H2O.     NUTRITION DIAGNOSIS:   Inadequate oral intake related to inability to eat as evidenced by NPO status.  GOAL:   Provide needs based on ASPEN/SCCM guidelines   MONITOR:   Vent status, Labs, Weight trends  REASON FOR ASSESSMENT:   Ventilator/ Consult to manage tube feeding    ASSESSMENT:  Patient remains intubated on ventilator support due to acute respiratory failure. He has hx of COPD, CHF, DM and CAD.   His weight is decreasing which is expected with diuretics. He was 90.7 kg on admission now down to 81.2 kg. His output documented at negative -3.4 liters since admission.  MV: 7.7 L/min Temp (24hrs), Avg:98.5 F (36.9 C), Min:98.2 F (36.8 C), Max:99.1 F (37.3 C) His propofol was increased since yesterday and is making  a significant contribution to his caloric intake. If possible if we could transition him to a different sedative his tube feeds would be able advanced to better meet his est needs. Propofol: 36 ml/hr -providing 950 kcal lipids at current rate every 24 hr Labs: glucose 127, BUN 57, and Cr 1.33    Diet Order:  Diet NPO time specified  Skin:   intact  Last BM:   12/20  Height:   Ht Readings from Last 1 Encounters:  04/17/15 5\' 6"  (1.676 m)    Weight:   Wt Readings from Last 1 Encounters:  04/17/15 179 lb 0.2 oz (81.2 kg)  Admit weight-90.7 kg  Ideal Body Weight:  67 kg  BMI:  Body mass index is 28.91 kg/(m^2).  Estimated Nutritional Needs:   Kcal:  1472   Protein:  115-129 gr  Fluid:  Negative fluid balance per MD fluid goal   EDUCATION NEEDS:   No education needs identified at this time  Royann ShiversLynn George Alcantar MS,RD,CSG,LDN Office: #[phone] Pager: [phone]

## 2015-04-17 NOTE — Progress Notes (Signed)
Subjective: Pt intubated, sedated.   Objective: Filed Vitals:   04/17/15 0800 04/17/15 0802 04/17/15 0803 04/17/15 0815  BP: 116/67     Pulse: 46     Temp:      TempSrc:   Oral   Resp: 14     Height:     (1.676 m)  Weight:      SpO2: 90% 91%     Weight change: -2.4 kg (-5 lb 4.7 oz)  Intake/Output Summary (Last 24 hours) at 04/17/15 1043 Last data filed at 04/17/15 0805  Gross per 24 hour  Intake 841.59 ml  Output   3000 ml  Net -2158.41 ml   Net I/O  -3.5 L   General: Pt intubated   Neck:  JVP is icnreased   Heart: Regular rate and rhythm, without murmurs, rubs, gallops.  Lungs: Clear to auscultation.  No rales or wheezes. Exemities:  No edema.   Neuro: Grossly intact, nonfocal.  Tele:  SB  50s    Lab Results: Results for orders placed or performed during the hospital encounter of 04/11/15 (from the past 24 hour(s))  Glucose, capillary     Status: Abnormal   Collection Time: 04/16/15 12:11 PM  Result Value Ref Range   Glucose-Capillary 143 (H) 65 - 99 mg/dL   Comment 1 Notify RN    Comment 2 Document in Chart   Glucose, capillary     Status: Abnormal   Collection Time: 04/16/15  3:38 PM  Result Value Ref Range   Glucose-Capillary 119 (H) 65 - 99 mg/dL   Comment 1 Notify RN    Comment 2 Document in Chart   Glucose, capillary     Status: Abnormal   Collection Time: 04/16/15  7:38 PM  Result Value Ref Range   Glucose-Capillary 120 (H) 65 - 99 mg/dL   Comment 1 Notify RN   Glucose, capillary     Status: Abnormal   Collection Time: 04/17/15 12:09 AM  Result Value Ref Range   Glucose-Capillary 149 (H) 65 - 99 mg/dL   Comment 1 Notify RN   Glucose, capillary     Status: Abnormal   Collection Time: 04/17/15  4:44 AM  Result Value Ref Range   Glucose-Capillary 120 (H) 65 - 99 mg/dL  Basic metabolic panel     Status: Abnormal   Collection Time: 04/17/15  4:45 AM  Result Value Ref Range   Sodium 142 135 - 145 mmol/L   Potassium 3.7 3.5 - 5.1 mmol/L   Chloride 99 (L) 101 - 111 mmol/L   CO2 30 22 - 32 mmol/L   Glucose, Bld 132 (H) 65 - 99 mg/dL   BUN 57 (H) 6 - 20 mg/dL   Creatinine, Ser 1.61 (H) 0.61 - 1.24 mg/dL   Calcium 9.0 8.9 - 09.6 mg/dL   GFR calc non Af Amer 51 (L) >60 mL/min   GFR calc Af Amer 60 (L) >60 mL/min   Anion gap 13 5 - 15  CBC     Status: Abnormal   Collection Time: 04/17/15  4:45 AM  Result Value Ref Range   WBC 4.6 4.0 - 10.5 K/uL   RBC 4.59 4.22 - 5.81 MIL/uL   Hemoglobin 11.7 (L) 13.0 - 17.0 g/dL   HCT 04.5 40.9 - 81.1 %   MCV 88.2 78.0 - 100.0 fL   MCH 25.5 (L) 26.0 - 34.0 pg   MCHC 28.9 (L) 30.0 - 36.0 g/dL   RDW 91.4 (H) 78.2 - 95.6 %  Platelets 136 (L) 150 - 400 K/uL  Blood gas, arterial     Status: Abnormal   Collection Time: 04/17/15  5:38 AM  Result Value Ref Range   FIO2 80.00    Delivery systems VENTILATOR    Mode PRESSURE REGULATED VOLUME CONTROL    VT 540 mL   LHR 14 resp/min   Peep/cpap 5.0 cm H20   pH, Arterial 7.524 (H) 7.350 - 7.450   pCO2 arterial 37.5 35.0 - 45.0 mmHg   pO2, Arterial 61.4 (L) 80.0 - 100.0 mmHg   Bicarbonate 31.3 (H) 20.0 - 24.0 mEq/L   TCO2 14.8 0 - 100 mmol/L   Acid-Base Excess 7.5 (H) 0.0 - 2.0 mmol/L   O2 Saturation 91.6 %   Patient temperature 37.0    Collection site LEFT RADIAL    Drawn by [id number]    Sample type ARTERIAL    Allens test (pass/fail) PASS PASS  Glucose, capillary     Status: Abnormal   Collection Time: 04/17/15  7:44 AM  Result Value Ref Range   Glucose-Capillary 119 (H) 65 - 99 mg/dL   Comment 1 Notify RN    Comment 2 Document in Chart     Studies/Results: Dg Chest Port 1 View  04/17/2015  CLINICAL DATA:  Respiratory failure. EXAM: PORTABLE CHEST 1 VIEW COMPARISON:  04/16/2015 . FINDINGS: Endotracheal tube, NG tube, right PICC line in stable position. Prior CABG. Cardiomegaly with pulmonary venous congestion. Bilateral lower lobe infiltrates and or edema noted. Bilateral pleural effusions noted. Findings suggest congestive heart  failure. Bibasilar pneumonia cannot be excluded. Low lung volumes with basilar atelectasis . No pneumothorax . Prior cervical spine fusion . IMPRESSION: 1. Lines and tubes in stable position. 2. Prior CABG. Cardiomegaly with pulmonary venous congestion. Bibasilar pulmonary infiltrates and or edema noted. Bilateral pleural effusions noted. Findings consistent with congestive heart failure. No significant change from prior exam. 3. Lung volumes with basilar atelectasis. Electronically Signed   By: Maisie Fushomas  Register   On: 04/17/2015 07:55   Dg Chest Port 1v Same Day  04/16/2015  CLINICAL DATA:  Check OG placement EXAM: PORTABLE CHEST - 1 VIEW SAME DAY COMPARISON:  04/16/2015 FINDINGS: Endotracheal tube is again identified in satisfactory position. A right-sided PICC line is noted at cavoatrial junction. A orogastric catheter is seen extending into the stomach. Bilateral pleural effusions and bibasilar atelectatic changes are again seen. No acute bony abnormality is noted. IMPRESSION: Tubes and lines as described in satisfactory position. Stable bibasilar changes. Electronically Signed   By: Alcide CleverMark  Lukens M.D.   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   WOuld repeat CT without contrast to follow fluid  Use as guide for thoracentesis.    2.  Resp  As above  Pt remains on 80% FiO2   CAD  Remaote CABG  4.  HTN  BP OK    5.  Renal  Follow Cr  1.33 today   Pt remains critically ill.    LOS: 6 days   Dietrich Patesaula Deanna Boehlke 04/17/2015, 10:43 AM

## 2015-04-17 NOTE — Progress Notes (Signed)
Subjective: He remains intubated and on the ventilator. He is still requiring 80% oxygen  Objective: Vital signs in last 24 hours: Temp:  [97.5 F (36.4 C)-99.1 F (37.3 C)] 98.2 F (36.8 C) (12/21 0400) Pulse Rate:  [44-75] 44 (12/21 0615) Resp:  [11-22] 14 (12/21 0615) BP: (104-160)/(59-76) 129/72 mmHg (12/21 0615) SpO2:  [85 %-94 %] 90 % (12/21 0615) FiO2 (%):  [80 %] 80 % (12/21 0300) Weight:  [81.2 kg (179 lb 0.2 oz)] 81.2 kg (179 lb 0.2 oz) (12/21 0500) Weight change: -2.4 kg (-5 lb 4.7 oz) Last BM Date: 04/16/15  Intake/Output from previous day: 12/20 0701 - 12/21 0700 In: 1053.6 [I.V.:587.9; NG/GT:115.7; IV Piggyback:350] Out: 3000 [Urine:3000]  PHYSICAL EXAM General appearance: Intubated and sedated on mechanical ventilation Resp: rhonchi bilaterally Cardio: regular rate and rhythm, S1, S2 normal, no murmur, click, rub or gallop GI: soft, non-tender; bowel sounds normal; no masses,  no organomegaly Extremities: extremities normal, atraumatic, no cyanosis or edema  Lab Results:  Results for orders placed or performed during the hospital encounter of 04/11/15 (from the past 48 hour(s))  Triglycerides     Status: None   Collection Time: 04/15/15  9:37 AM  Result Value Ref Range   Triglycerides 126 <150 mg/dL  Glucose, capillary     Status: Abnormal   Collection Time: 04/15/15 11:36 AM  Result Value Ref Range   Glucose-Capillary 143 (H) 65 - 99 mg/dL  Glucose, capillary     Status: Abnormal   Collection Time: 04/15/15  7:47 PM  Result Value Ref Range   Glucose-Capillary 120 (H) 65 - 99 mg/dL   Comment 1 Notify RN   Glucose, capillary     Status: Abnormal   Collection Time: 04/16/15 12:04 AM  Result Value Ref Range   Glucose-Capillary 130 (H) 65 - 99 mg/dL   Comment 1 Notify RN   Glucose, capillary     Status: Abnormal   Collection Time: 04/16/15  3:48 AM  Result Value Ref Range   Glucose-Capillary 140 (H) 65 - 99 mg/dL   Comment 1 Notify RN   Blood gas,  arterial     Status: Abnormal   Collection Time: 04/16/15  4:10 AM  Result Value Ref Range   FIO2 0.80    Delivery systems VENTILATOR    Mode PRESSURE REGULATED VOLUME CONTROL    VT 540 mL   LHR 14.0 resp/min   Peep/cpap 5.0 cm H20   pH, Arterial 7.443 7.350 - 7.450   pCO2 arterial 44.5 35.0 - 45.0 mmHg   pO2, Arterial 68.1 (Mendoza) 80.0 - 100.0 mmHg   Bicarbonate 29.1 (H) 20.0 - 24.0 mEq/Mendoza   Acid-Base Excess 5.8 (H) 0.0 - 2.0 mmol/Mendoza   O2 Saturation 91.2 %   Patient temperature 98.7    Collection site LEFT RADIAL    Drawn by [id number]    Sample type ARTERIAL DRAW    Allens test (pass/fail) PASS PASS  Basic metabolic panel     Status: Abnormal   Collection Time: 04/16/15  4:41 AM  Result Value Ref Range   Sodium 141 135 - 145 mmol/Mendoza   Potassium 4.5 3.5 - 5.1 mmol/Mendoza   Chloride 98 (Mendoza) 101 - 111 mmol/Mendoza   CO2 32 22 - 32 mmol/Mendoza   Glucose, Bld 153 (H) 65 - 99 mg/dL   BUN 57 (H) 6 - 20 mg/dL   Creatinine, Ser 1.39 (H) 0.61 - 1.24 mg/dL   Calcium 8.6 (Mendoza) 8.9 - 10.3 mg/dL   GFR  calc non Af Amer 49 (Mendoza) >60 mL/min   GFR calc Af Amer 56 (Mendoza) >60 mL/min    Comment: (NOTE) The eGFR has been calculated using the CKD EPI equation. This calculation has not been validated in all clinical situations. eGFR's persistently <60 mL/min signify possible Chronic Kidney Disease.    Anion gap 11 5 - 15  CBC     Status: Abnormal   Collection Time: 04/16/15  4:41 AM  Result Value Ref Range   WBC 4.9 4.0 - 10.5 K/uL   RBC 4.56 4.22 - 5.81 MIL/uL   Hemoglobin 11.3 (Mendoza) 13.0 - 17.0 g/dL   HCT 40.5 39.0 - 52.0 %   MCV 88.8 78.0 - 100.0 fL   MCH 24.8 (Mendoza) 26.0 - 34.0 pg   MCHC 27.9 (Mendoza) 30.0 - 36.0 g/dL   RDW 19.0 (H) 11.5 - 15.5 %   Platelets 149 (Mendoza) 150 - 400 K/uL  Glucose, capillary     Status: Abnormal   Collection Time: 04/16/15  7:57 AM  Result Value Ref Range   Glucose-Capillary 127 (H) 65 - 99 mg/dL   Comment 1 Notify RN    Comment 2 Document in Chart   Glucose, capillary     Status: Abnormal    Collection Time: 04/16/15 12:11 PM  Result Value Ref Range   Glucose-Capillary 143 (H) 65 - 99 mg/dL   Comment 1 Notify RN    Comment 2 Document in Chart   Glucose, capillary     Status: Abnormal   Collection Time: 04/16/15  3:38 PM  Result Value Ref Range   Glucose-Capillary 119 (H) 65 - 99 mg/dL   Comment 1 Notify RN    Comment 2 Document in Chart   Glucose, capillary     Status: Abnormal   Collection Time: 04/16/15  7:38 PM  Result Value Ref Range   Glucose-Capillary 120 (H) 65 - 99 mg/dL   Comment 1 Notify RN   Glucose, capillary     Status: Abnormal   Collection Time: 04/17/15 12:09 AM  Result Value Ref Range   Glucose-Capillary 149 (H) 65 - 99 mg/dL   Comment 1 Notify RN   Glucose, capillary     Status: Abnormal   Collection Time: 04/17/15  4:44 AM  Result Value Ref Range   Glucose-Capillary 120 (H) 65 - 99 mg/dL  Basic metabolic panel     Status: Abnormal   Collection Time: 04/17/15  4:45 AM  Result Value Ref Range   Sodium 142 135 - 145 mmol/Mendoza   Potassium 3.7 3.5 - 5.1 mmol/Mendoza    Comment: DELTA CHECK NOTED   Chloride 99 (Mendoza) 101 - 111 mmol/Mendoza   CO2 30 22 - 32 mmol/Mendoza   Glucose, Bld 132 (H) 65 - 99 mg/dL   BUN 57 (H) 6 - 20 mg/dL   Creatinine, Ser 1.33 (H) 0.61 - 1.24 mg/dL   Calcium 9.0 8.9 - 10.3 mg/dL   GFR calc non Af Amer 51 (Mendoza) >60 mL/min   GFR calc Af Amer 60 (Mendoza) >60 mL/min    Comment: (NOTE) The eGFR has been calculated using the CKD EPI equation. This calculation has not been validated in all clinical situations. eGFR's persistently <60 mL/min signify possible Chronic Kidney Disease.    Anion gap 13 5 - 15  CBC     Status: Abnormal   Collection Time: 04/17/15  4:45 AM  Result Value Ref Range   WBC 4.6 4.0 - 10.5 K/uL   RBC 4.59  4.22 - 5.81 MIL/uL   Hemoglobin 11.7 (Mendoza) 13.0 - 17.0 g/dL   HCT 40.5 39.0 - 52.0 %   MCV 88.2 78.0 - 100.0 fL   MCH 25.5 (Mendoza) 26.0 - 34.0 pg   MCHC 28.9 (Mendoza) 30.0 - 36.0 g/dL   RDW 18.7 (H) 11.5 - 15.5 %   Platelets 136 (Mendoza)  150 - 400 K/uL  Blood gas, arterial     Status: Abnormal   Collection Time: 04/17/15  5:38 AM  Result Value Ref Range   FIO2 80.00    Delivery systems VENTILATOR    Mode PRESSURE REGULATED VOLUME CONTROL    VT 540 mL   LHR 14 resp/min   Peep/cpap 5.0 cm H20   pH, Arterial 7.524 (H) 7.350 - 7.450   pCO2 arterial 37.5 35.0 - 45.0 mmHg   pO2, Arterial 61.4 (Mendoza) 80.0 - 100.0 mmHg   Bicarbonate 31.3 (H) 20.0 - 24.0 mEq/Mendoza   TCO2 14.8 0 - 100 mmol/Mendoza   Acid-Base Excess 7.5 (H) 0.0 - 2.0 mmol/Mendoza   O2 Saturation 91.6 %   Patient temperature 37.0    Collection site LEFT RADIAL    Drawn by [id number]    Sample type ARTERIAL    Allens test (pass/fail) PASS PASS  Glucose, capillary     Status: Abnormal   Collection Time: 04/17/15  7:44 AM  Result Value Ref Range   Glucose-Capillary 119 (H) 65 - 99 mg/dL   Comment 1 Notify RN    Comment 2 Document in Chart     ABGS  Recent Labs  04/17/15 0538  PHART 7.524*  PO2ART 61.4*  TCO2 14.8  HCO3 31.3*   CULTURES Recent Results (from the past 240 hour(s))  MRSA PCR Screening     Status: None   Collection Time: 04/11/15 11:42 AM  Result Value Ref Range Status   MRSA by PCR NEGATIVE NEGATIVE Final    Comment:        The GeneXpert MRSA Assay (FDA approved for NASAL specimens only), is one component of a comprehensive MRSA colonization surveillance program. It is not intended to diagnose MRSA infection nor to guide or monitor treatment for MRSA infections.   Culture, blood (Routine X 2) w Reflex to ID Panel     Status: None (Preliminary result)   Collection Time: 04/13/15 10:02 AM  Result Value Ref Range Status   Specimen Description BLOOD LEFT ARM  Final   Special Requests BOTTLES DRAWN AEROBIC AND ANAEROBIC 6CC EACH  Final   Culture NO GROWTH 3 DAYS  Final   Report Status PENDING  Incomplete  Culture, blood (Routine X 2) w Reflex to ID Panel     Status: None (Preliminary result)   Collection Time: 04/13/15 10:08 AM  Result Value Ref  Range Status   Specimen Description BLOOD LEFT HAND  Final   Special Requests BOTTLES DRAWN AEROBIC AND ANAEROBIC 5CC EACH  Final   Culture NO GROWTH 3 DAYS  Final   Report Status PENDING  Incomplete  Culture, respiratory (NON-Expectorated)     Status: None (Preliminary result)   Collection Time: 04/14/15  9:32 PM  Result Value Ref Range Status   Specimen Description TRACHEAL ASPIRATE  Final   Special Requests NONE  Final   Gram Stain   Final    MODERATE WBC PRESENT, PREDOMINANTLY PMN NO SQUAMOUS EPITHELIAL CELLS SEEN MODERATE GRAM POSITIVE RODS Performed at [facility]    Culture   Final    NO GROWTH 1 DAY  Performed at [facility]    Report Status PENDING  Incomplete  Culture, expectorated sputum-assessment     Status: None   Collection Time: 04/14/15  9:48 PM  Result Value Ref Range Status   Specimen Description ENDOTRACHEAL  Final   Special Requests NONE  Final   Sputum evaluation THIS SPECIMEN IS ACCEPTABLE FOR SPUTUM CULTURE  Final   Report Status 04/14/2015 FINAL  Final   Studies/Results: Dg Chest Port 1 View  04/16/2015  CLINICAL DATA:  Altered mental status, respiratory failure EXAM: PORTABLE CHEST 1 VIEW COMPARISON:  04/15/2015 FINDINGS: Endotracheal tube, NG tube and RIGHT PICC line are unchanged. There are low lung volumes and bilateral pleural effusions unchanged. Mild central venous congestion. No pneumothorax. IMPRESSION: 1. Stable support apparatus. 2. Low lung volumes with bilateral pleural effusions with no significant change. Electronically Signed   By: Suzy Bouchard M.D.   On: 04/16/2015 08:47   Dg Chest Port 1v Same Day  04/16/2015  CLINICAL DATA:  Check OG placement EXAM: PORTABLE CHEST - 1 VIEW SAME DAY COMPARISON:  04/16/2015 FINDINGS: Endotracheal tube is again identified in satisfactory position. A right-sided PICC line is noted at cavoatrial junction. A orogastric catheter is seen extending into the stomach. Bilateral pleural  effusions and bibasilar atelectatic changes are again seen. No acute bony abnormality is noted. IMPRESSION: Tubes and lines as described in satisfactory position. Stable bibasilar changes. Electronically Signed   By: Inez Catalina M.D.   On: 04/16/2015 20:36    Medications:  Prior to Admission:  Prescriptions prior to admission  Medication Sig Dispense Refill Last Dose  . amLODipine (NORVASC) 10 MG tablet Take 10 mg by mouth daily.   04/11/2015 at Unknown time  . atorvastatin (LIPITOR) 20 MG tablet Take 20 mg by mouth daily.   04/11/2015 at Unknown time  . clonazePAM (KLONOPIN) 1 MG tablet Take 1 mg by mouth 2 (two) times daily.   04/11/2015 at Unknown time  . clopidogrel (PLAVIX) 75 MG tablet Take 75 mg by mouth daily.   04/11/2015 at Unknown time  . esomeprazole (NEXIUM) 40 MG capsule Take 40 mg by mouth daily before breakfast.   04/11/2015 at Unknown time  . furosemide (LASIX) 40 MG tablet Take 0.5-1 tablets (20-40 mg total) by mouth every other day. Alternated a half pill with whole pill every other day (Patient taking differently: Take 40 mg by mouth daily. ) 115 tablet 3 unknown  . glimepiride (AMARYL) 2 MG tablet Take 2 mg by mouth daily.    04/11/2015 at Unknown time  . isosorbide mononitrate (IMDUR) 30 MG 24 hr tablet Take 30 mg by mouth daily.   04/11/2015 at Unknown time  . lisinopril (PRINIVIL,ZESTRIL) 2.5 MG tablet Take 2.5 mg by mouth daily.   04/11/2015 at Unknown time  . metFORMIN (GLUCOPHAGE) 500 MG tablet Take 1,000 mg by mouth 2 (two) times daily with a meal.   04/11/2015 at Unknown time  . metoprolol (LOPRESSOR) 100 MG tablet Take 100 mg by mouth every morning. & 1/2 by mouth in evening   04/11/2015 at Unknown time  . nitroGLYCERIN (NITROSTAT) 0.4 MG SL tablet Place 0.4 mg under the tongue every 5 (five) minutes as needed.   unknown  . oxyCODONE-acetaminophen (PERCOCET) 7.5-325 MG tablet Take 1 tablet by mouth every 4 (four) hours as needed for severe pain (4-6 hours).   unknown   . tiZANidine (ZANAFLEX) 4 MG tablet Take 4 mg by mouth 2 (two) times daily.    04/11/2015 at  Unknown time  . traZODone (DESYREL) 100 MG tablet Take 100 mg by mouth at bedtime.   04/10/2015 at Unknown time  . gabapentin (NEURONTIN) 300 MG capsule Take 300 mg by mouth 3 (three) times daily.   Taking   Scheduled: . antiseptic oral rinse  7 mL Mouth Rinse QID  . azithromycin  500 mg Intravenous Q24H  . cefTRIAXone (ROCEPHIN)  IV  1 g Intravenous Q24H  . chlorhexidine gluconate  15 mL Mouth Rinse BID  . enoxaparin (LOVENOX) injection  40 mg Subcutaneous Q24H  . feeding supplement (PRO-STAT SUGAR FREE 64)  60 mL Per Tube TID  . feeding supplement (VITAL HIGH PROTEIN)  1,000 mL Per Tube Q24H  . furosemide  40 mg Intravenous Daily  . insulin aspart  0-15 Units Subcutaneous 6 times per day  . ipratropium-albuterol  3 mL Nebulization Q6H  . methylPREDNISolone (SOLU-MEDROL) injection  40 mg Intravenous Q12H  . metoprolol  5 mg Intravenous 4 times per day  . pantoprazole (PROTONIX) IV  40 mg Intravenous Daily  . sodium chloride  3 mL Intravenous Q12H   Continuous: . propofol (DIPRIVAN) infusion 70 mcg/kg/min (04/17/15 0450)   QTT:CNGFREVQ (SUBLIMAZE) injection, fentaNYL (SUBLIMAZE) injection, nitroGLYCERIN, ondansetron **OR** ondansetron (ZOFRAN) IV  Assesment: He has acute hypercapnic respiratory failure which is multifactorial. He has COPD heart failure and community-acquired pneumonia. He is still requiring high flow oxygen. Principal Problem:   Ventilator dependence (HCC) Active Problems:   Diabetes ([place])   TOBACCO USER   Essential hypertension   CORONARY ATHEROSCLEROSIS NATIVE CORONARY ARTERY   Pleural effusion, right   Acute respiratory failure with hypercapnia (HCC)   COPD with acute exacerbation (HCC)   Diastolic CHF, acute on chronic (HCC)   CAP (community acquired pneumonia)    Plan: Discussed with Dr. Marin Comment hospitalist attending. I agree I think his blood pressure would be able  to tolerate increasing PEEP and will increase to a goal of 10. He is going to try to diurese him some more and see if he has a significant element of heart failure that is causing some of the problem.    LOS: 6 days   Miguel Mendoza 04/17/2015, 7:53 AM

## 2015-04-17 NOTE — Progress Notes (Signed)
[facility] PROGRESS NOTE  Miguel Mendoza ZOX:[id number] DOB: 1941-01-24    PCP:   [name], MD   HPI: patient with hx of CAD, CHF, DM, prior CVA, HTN, GERD, admitted for hypercapneic hypoxic respiratory failure, having bilateral pleural effusion, PNA, CHF, and currently intubated requiring FIO2 of 80 percent, 5 peep, and getting TF.  He is followed closely by pulmonary and cardiology.  When his peep was increased this morning, he desat more, so it was reverted back to 5.  IR was requested for thoracocentesis, but did not feel he had enough fluid to tap.    Rewiew of Systems:    Past Medical History  Diagnosis Date  . CAD (coronary artery disease)     Multivessel status post stent to LAD 1998 then CABG 2003  . Myocardial infarction (HCC)   . History of stroke 34  . Type 2 diabetes mellitus (HCC)   . Essential hypertension   . Dyslipidemia   . Migraine headache   . GERD (gastroesophageal reflux disease)   . Anxiety   . Cervical disc disease     Past Surgical History  Procedure Laterality Date  . Coronary artery bypass graft  2003    Dr. Cornelius Moras - [place] to LAD, SVG to OM, SVG to RCA  . Carotid endarterectomy Left   . Back surgery    . Anterior cervical decomp/discectomy fusion      Medications:  HOME MEDS: Prior to Admission medications   Medication Sig Start Date End Date Taking? Authorizing Provider  amLODipine (NORVASC) 10 MG tablet Take 10 mg by mouth daily.   Yes Historical Provider, MD  atorvastatin (LIPITOR) 20 MG tablet Take 20 mg by mouth daily.   Yes Historical Provider, MD  clonazePAM (KLONOPIN) 1 MG tablet Take 1 mg by mouth 2 (two) times daily.   Yes Historical Provider, MD  clopidogrel (PLAVIX) 75 MG tablet Take 75 mg by mouth daily.   Yes Historical Provider, MD  esomeprazole (NEXIUM) 40 MG capsule Take 40 mg by mouth daily before breakfast.   Yes Historical Provider, MD  furosemide (LASIX) 40 MG tablet Take 0.5-1 tablets (20-40 mg total) by mouth  every other day. Alternated a half pill with whole pill every other day Patient taking differently: Take 40 mg by mouth daily.  03/14/15  Yes Jonelle Sidle, MD  glimepiride (AMARYL) 2 MG tablet Take 2 mg by mouth daily.    Yes Historical Provider, MD  isosorbide mononitrate (IMDUR) 30 MG 24 hr tablet Take 30 mg by mouth daily.   Yes Historical Provider, MD  lisinopril (PRINIVIL,ZESTRIL) 2.5 MG tablet Take 2.5 mg by mouth daily.   Yes Historical Provider, MD  metFORMIN (GLUCOPHAGE) 500 MG tablet Take 1,000 mg by mouth 2 (two) times daily with a meal.   Yes Historical Provider, MD  metoprolol (LOPRESSOR) 100 MG tablet Take 100 mg by mouth every morning. & 1/2 by mouth in evening   Yes Historical Provider, MD  nitroGLYCERIN (NITROSTAT) 0.4 MG SL tablet Place 0.4 mg under the tongue every 5 (five) minutes as needed.   Yes Historical Provider, MD  oxyCODONE-acetaminophen (PERCOCET) 7.5-325 MG tablet Take 1 tablet by mouth every 4 (four) hours as needed for severe pain (4-6 hours).   Yes Historical Provider, MD  tiZANidine (ZANAFLEX) 4 MG tablet Take 4 mg by mouth 2 (two) times daily.    Yes Historical Provider, MD  traZODone (DESYREL) 100 MG tablet Take 100 mg by mouth at bedtime.   Yes  Historical Provider, MD  gabapentin (NEURONTIN) 300 MG capsule Take 300 mg by mouth 3 (three) times daily.    Historical Provider, MD     Allergies:  Allergies  Allergen Reactions  . Ibuprofen Hives    Social History:   reports that he has been smoking Cigarettes.  He has been smoking about 0.50 packs per day. He does not have any smokeless tobacco history on file. He reports that he does not drink alcohol or use illicit drugs.  Family History: Family History  Problem Relation Age of Onset  . Heart attack Father   . Heart attack Mother   . Heart attack Brother      Physical Exam: Filed Vitals:   04/17/15 0800 04/17/15 0802 04/17/15 0803 04/17/15 0815  BP: 116/67     Pulse: 46     Temp:       TempSrc:   Oral   Resp: 14     Height:    5\' 6"  (1.676 m)  Weight:      SpO2: 90% 91%     Blood pressure 116/67, pulse 46, temperature 98.2 F (36.8 C), temperature source Axillary, resp. rate 14, height 5\' 6"  (1.676 m), weight 81.2 kg (179 lb 0.2 oz), SpO2 91 %.  GEN: intubated and sedated.  HEENT: Mucous membranes pink and anicteric; no cervical lymphadenopathy nor thyromegaly or carotid bruit; no JVD; There were no stridor. Neck is very supple. Breasts:: Not examined CHEST: Normal respiration, mechanically ventilated.  HEART: Regular rate and rhythm.  There are no murmur, rub, or gallops.   BACK: No kyphosis or scoliosis; no CVA tenderness ABDOMEN: soft and non-tender; no masses, no organomegaly, normal abdominal bowel sounds; no pannus; no intertriginous candida. There is no rebound and no distention. Rectal Exam: Not done EXTREMITIES: No bone or joint deformity; age-appropriate arthropathy of the hands and knees; no edema; no ulcerations.  There is no calf tenderness. Genitalia: not examined PULSES: 2+ and symmetric SKIN: Normal hydration no rash or ulceration CNS: Sedated and Ventilated.    Recent Labs Lab 04/12/15 0449 04/13/15 0420 04/14/15 0455 04/16/15 0441 04/17/15 0445  NA 142 141 142 141 142  K 5.1 4.6 4.3 4.5 3.7  CL 100* 100* 105 98* 99*  CO2 34* 31 25 32 30  GLUCOSE 161* 120* 82 153* 132*  BUN 22* 34* 43* 57* 57*  CREATININE 1.44* 1.65* 1.58* 1.39* 1.33*  CALCIUM 9.2 8.8* 8.9 8.6* 9.0   Liver Function Tests:  Recent Labs Lab 04/11/15 1812 04/12/15 0449  AST 15 12*  ALT 8* 7*  ALKPHOS 60 53  BILITOT 1.1 1.0  PROT 7.9 6.9  ALBUMIN 4.0 3.4*   CBC:  Recent Labs Lab 04/11/15 1812 04/12/15 0449 04/13/15 0420 04/14/15 0455 04/16/15 0441 04/17/15 0445  WBC 6.5 4.2 7.3 6.6 4.9 4.6  NEUTROABS 4.7  --   --   --   --   --   HGB 12.8* 11.8* 11.0* 12.4* 11.3* 11.7*  HCT 47.3 44.4 39.1 41.7 40.5 40.5  MCV 95.2 94.9 90.1 88.0 88.8 88.2  PLT 192  180 188 144* 149* 136*   Cardiac Enzymes:  Recent Labs Lab 04/11/15 1812  TROPONINI 0.05*    CBG:  Recent Labs Lab 04/16/15 1538 04/16/15 1938 04/17/15 0009 04/17/15 0444 04/17/15 0744  GLUCAP 119* 120* 149* 120* 119*     Radiological Exams on Admission: Dg Chest Port 1 View  04/17/2015  CLINICAL DATA:  Respiratory failure. EXAM: PORTABLE CHEST 1 VIEW COMPARISON:  04/16/2015 . FINDINGS: Endotracheal tube, NG tube, right PICC line in stable position. Prior CABG. Cardiomegaly with pulmonary venous congestion. Bilateral lower lobe infiltrates and or edema noted. Bilateral pleural effusions noted. Findings suggest congestive heart failure. Bibasilar pneumonia cannot be excluded. Low lung volumes with basilar atelectasis . No pneumothorax . Prior cervical spine fusion . IMPRESSION: 1. Lines and tubes in stable position. 2. Prior CABG. Cardiomegaly with pulmonary venous congestion. Bibasilar pulmonary infiltrates and or edema noted. Bilateral pleural effusions noted. Findings consistent with congestive heart failure. No significant change from prior exam. 3. Lung volumes with basilar atelectasis. Electronically Signed   By: Maisie Fushomas  Register   On: 04/17/2015 07:55   Dg Chest Port 1 View  04/16/2015  CLINICAL DATA:  Altered mental status, respiratory failure EXAM: PORTABLE CHEST 1 VIEW COMPARISON:  04/15/2015 FINDINGS: Endotracheal tube, NG tube and RIGHT PICC line are unchanged. There are low lung volumes and bilateral pleural effusions unchanged. Mild central venous congestion. No pneumothorax. IMPRESSION: 1. Stable support apparatus. 2. Low lung volumes with bilateral pleural effusions with no significant change. Electronically Signed   By: Genevive BiStewart  Edmunds M.D.   On: 04/16/2015 08:47   Dg Chest Port 1v Same Day  04/16/2015  CLINICAL DATA:  Check OG placement EXAM: PORTABLE CHEST - 1 VIEW SAME DAY COMPARISON:  04/16/2015 FINDINGS: Endotracheal tube is again identified in satisfactory  position. A right-sided PICC line is noted at cavoatrial junction. A orogastric catheter is seen extending into the stomach. Bilateral pleural effusions and bibasilar atelectatic changes are again seen. No acute bony abnormality is noted. IMPRESSION: Tubes and lines as described in satisfactory position. Stable bibasilar changes. Electronically Signed   By: Alcide CleverMark  Lukens M.D.   On: 04/16/2015 20:36   Assessment/Plan  Acute hypoxemic and hypercarbic respiratory failure -Etiology likely multifactorial: COPD, right greater than left pleural effusion, CHF, pneumonia. -Was intubated 12/16 due to progression of respiratory failure on BiPAP. -CT scan of the chest performed 12/16 show: Interval increase in volume of right pleural effusion with complete consolidation of the right lower lobe. New posterior medial consolidation of the left lower lobe.  -Please see below for further details. -Appreciate pulmonary consultation. -Increased oxygen requirements to 80%. No weaning attempts today. -Poor prognosis.  Right greater than left pleural effusion -Etiology remains unclear to me at present. May represent CHF although may also be parapneumonic. -Thoracentesis was requested, however as per radiology there was not sufficient fluid for drainage. Effusion however appears quite large on CT chest, may be of benefit to try and reattempt thoracentesis at a later date.  Potential community-acquired pneumonia -Agree with Rocephin and azithromycin. -Blood cx remain negative to date. Sputum cx pending.  COPD with acute exacerbation -Continue steroids/nebs.  Diabetes mellitus -Fair control, continue sliding scale.  Nutrition -Started on tube feeds.  Acute on chronic diastolic CHF -Per history EF appears to be 50-60%. -2-D echo: EF 55-60%, grade 1 diastolic dysfunction. Will change lasix to IV Bumex.  If ineffective, consider Bumex drip.  -Appreciate cardiology consultation.  History of coronary artery  disease -Appears stable at this venue.  Elevated troponin -Mild elevation to 0.05. Likely represents demand ischemia given complexity of overall illness and respiratory failure. -2-D echo: Ejection fraction of 55-60%, normal wall motion, grade 1 diastolic dysfunction. -No further workup planned for at present.  Acute renal failure -Improving. -Related to acute illness, ATN. -Monitor renal function. -May worsen with diuresis given acute CHF, will continue with more aggressive diuresis.    Code Status: Full  code Family Communication:Wife Jackie at bedside 12/18 and updated on plan of care and all questions answered. Disposition Plan: Keep in ICU.   Consultants:  Pulmonary  Cardiology   Antibiotics:  Rocephin  Azythromycin   Other plans as per orders.  Code Status: FULL Unk Lightning, MD. Jerrel Ivory. [facility] Pager [phone] 7pm to 7am.  04/17/2015, 8:57 AM

## 2015-04-18 ENCOUNTER — Inpatient Hospital Stay (HOSPITAL_COMMUNITY): Payer: Medicare HMO

## 2015-04-18 LAB — CULTURE, BLOOD (ROUTINE X 2)
Culture: NO GROWTH
Culture: NO GROWTH

## 2015-04-18 LAB — BLOOD GAS, ARTERIAL
ACID-BASE EXCESS: 6.9 mmol/L — AB (ref 0.0–2.0)
BICARBONATE: 30.7 meq/L — AB (ref 20.0–24.0)
DRAWN BY: 317771
FIO2: 0.7
LHR: 14 {breaths}/min
O2 Saturation: 91.8 %
PEEP/CPAP: 5 cmH2O
PH ART: 7.505 — AB (ref 7.350–7.450)
VT: 540 mL
pCO2 arterial: 38.7 mmHg (ref 35.0–45.0)
pO2, Arterial: 65.5 mmHg — ABNORMAL LOW (ref 80.0–100.0)

## 2015-04-18 LAB — GLUCOSE, CAPILLARY
GLUCOSE-CAPILLARY: 123 mg/dL — AB (ref 65–99)
GLUCOSE-CAPILLARY: 133 mg/dL — AB (ref 65–99)
Glucose-Capillary: 113 mg/dL — ABNORMAL HIGH (ref 65–99)
Glucose-Capillary: 119 mg/dL — ABNORMAL HIGH (ref 65–99)
Glucose-Capillary: 142 mg/dL — ABNORMAL HIGH (ref 65–99)
Glucose-Capillary: 162 mg/dL — ABNORMAL HIGH (ref 65–99)
Glucose-Capillary: 87 mg/dL (ref 65–99)

## 2015-04-18 LAB — CREATININE, SERUM
Creatinine, Ser: 1.28 mg/dL — ABNORMAL HIGH (ref 0.61–1.24)
GFR calc Af Amer: >60 mL/min (ref >60)
GFR, EST NON AFRICAN AMERICAN: 54 mL/min — AB (ref >60)

## 2015-04-18 LAB — TRIGLYCERIDES: TRIGLYCERIDES: 171 mg/dL — AB (ref <150)

## 2015-04-18 NOTE — Care Management Note (Signed)
Case Management Note  Patient Details  Name: York GriceMarvin C Bradwell MRN: [id number] Date of Birth: [birth year]   Expected Discharge Date:  04/15/15               Expected Discharge Plan:  Home w Home Health Services  In-House Referral:  NA  Discharge planning Services  CM Consult  Post Acute Care Choice:    Choice offered to:     DME Arranged:    DME Agency:     HH Arranged:    HH Agency:     Status of Service:  In process, will continue to follow  Medicare Important Message Given:  Yes Date Medicare IM Given:    Medicare IM give by:    Date Additional Medicare IM Given:    Additional Medicare Important Message give by:     If discussed at Long Length of Stay Meetings, dates discussed:  04/18/2015  Additional Comments: Cont to diurese.  Malcolm Metrohildress, Bernese Doffing Demske, RN 04/18/2015, 2:53 PM

## 2015-04-18 NOTE — Progress Notes (Signed)
Subjective: Intubated  Objective: Filed Vitals:   04/18/15 0800 04/18/15 0801 04/18/15 0806 04/18/15 0812  BP: 115/68     Pulse: 62     Temp:    97.8 F (36.6 C)  TempSrc:      Resp: 15     Height:   5\' 6"  (1.676 m)   Weight:      SpO2: 92% 90%     Weight change: -0.1 kg (-3.5 oz)  Intake/Output Summary (Last 24 hours) at 04/18/15 1112 Last data filed at 04/18/15 1032  Gross per 24 hour  Intake  505.7 ml  Output   2901 ml  Net -2395.3 ml    General:Pt intubated  Neck:  Neck is full   Heart: Regular rate and rhythm, without murmurs, rubs, gallops.  Lungs: Clear to auscultation anteriorly .  No rales or wheezes. Exemities:  No edema.     TELE  SB    Lab Results: Results for orders placed or performed during the hospital encounter of 04/11/15 (from the past 24 hour(s))  Glucose, capillary     Status: Abnormal   Collection Time: 04/17/15 12:06 PM  Result Value Ref Range   Glucose-Capillary 127 (H) 65 - 99 mg/dL   Comment 1 Notify RN    Comment 2 Document in Chart   Glucose, capillary     Status: Abnormal   Collection Time: 04/17/15  4:50 PM  Result Value Ref Range   Glucose-Capillary 126 (H) 65 - 99 mg/dL   Comment 1 Notify RN    Comment 2 Document in Chart   Glucose, capillary     Status: Abnormal   Collection Time: 04/17/15  7:43 PM  Result Value Ref Range   Glucose-Capillary 111 (H) 65 - 99 mg/dL  Glucose, capillary     Status: Abnormal   Collection Time: 04/18/15 12:37 AM  Result Value Ref Range   Glucose-Capillary 162 (H) 65 - 99 mg/dL  Blood gas, arterial     Status: Abnormal   Collection Time: 04/18/15  4:05 AM  Result Value Ref Range   FIO2 0.70    Delivery systems VENTILATOR    Mode PRESSURE REGULATED VOLUME CONTROL    VT 540 mL   LHR 14.0 resp/min   Peep/cpap 5.0 cm H20   pH, Arterial 7.505 (H) 7.350 - 7.450   pCO2 arterial 38.7 35.0 - 45.0 mmHg   pO2, Arterial 65.5 (L) 80.0 - 100.0 mmHg   Bicarbonate 30.7 (H) 20.0 - 24.0 mEq/L   Acid-Base Excess 6.9 (H) 0.0 - 2.0 mmol/L   O2 Saturation 91.8 %   Collection site LEFT RADIAL    Drawn by [phone]    Sample type ARTERIAL DRAW    Allens test (pass/fail) PASS PASS  Creatinine, serum     Status: Abnormal   Collection Time: 04/18/15  4:44 AM  Result Value Ref Range   Creatinine, Ser 1.28 (H) 0.61 - 1.24 mg/dL   GFR calc non Af Amer 54 (L) >60 mL/min   GFR calc Af Amer >60 >60 mL/min  Triglycerides     Status: Abnormal   Collection Time: 04/18/15  4:44 AM  Result Value Ref Range   Triglycerides 171 (H) <150 mg/dL  Glucose, capillary     Status: Abnormal   Collection Time: 04/18/15  5:04 AM  Result Value Ref Range   Glucose-Capillary 119 (H) 65 - 99 mg/dL  Glucose, capillary     Status: Abnormal   Collection Time: 04/18/15  7:57 AM  Result Value Ref Range   Glucose-Capillary 113 (H) 65 - 99 mg/dL   Comment 1 Notify RN    Comment 2 Document in Chart     Studies/Results: No results found.  Medications: Reviewed  @  1  Bradycardia  HR stable  BP OK  Follow  2  Acute on chornic diastolic CHF  Diureising with IV bumex  For CT today to evaluate pleural effusion.  3.  Pulm Plan as noted above  F1O2 has been decreased to 70%.    LOS: 7 days   Dietrich Pates 04/18/2015, 11:12 AM

## 2015-04-18 NOTE — Progress Notes (Signed)
Patient's tube holder changed. RN at bedside

## 2015-04-18 NOTE — Progress Notes (Signed)
Subjective: He remains intubated on the ventilator. No new problems have been noted. He has diuresed more. His blood gases somewhat better and he has been able to go down to 70% oxygen so I think things are improving a little bit.  Objective: Vital signs in last 24 hours: Temp:  [97.6 F (36.4 C)-99.4 F (37.4 C)] 99.4 F (37.4 C) (12/22 0000) Pulse Rate:  [46-82] 61 (12/22 0400) Resp:  [0-20] 0 (12/22 0400) BP: (102-146)/(54-85) 115/59 mmHg (12/22 0400) SpO2:  [90 %-98 %] 92 % (12/22 0400) FiO2 (%):  [70 %-80 %] 70 % (12/22 0400) Weight change:  Last BM Date: 04/16/15  Intake/Output from previous day: 12/21 0701 - 12/22 0700 In: 1004.3 [I.V.:424.3; NG/GT:270; IV Piggyback:300] Out: 2201 [Urine:2200; Stool:1]  PHYSICAL EXAM General appearance: Intubated sedated on the ventilator Resp: rhonchi bilaterally Cardio: regular rate and rhythm, S1, S2 normal, no murmur, click, rub or gallop GI: soft, non-tender; bowel sounds normal; no masses,  no organomegaly Extremities: extremities normal, atraumatic, no cyanosis or edema  Lab Results:  Results for orders placed or performed during the hospital encounter of 04/11/15 (from the past 48 hour(s))  Glucose, capillary     Status: Abnormal   Collection Time: 04/16/15  7:57 AM  Result Value Ref Range   Glucose-Capillary 127 (H) 65 - 99 mg/dL   Comment 1 Notify RN    Comment 2 Document in Chart   Glucose, capillary     Status: Abnormal   Collection Time: 04/16/15 12:11 PM  Result Value Ref Range   Glucose-Capillary 143 (H) 65 - 99 mg/dL   Comment 1 Notify RN    Comment 2 Document in Chart   Glucose, capillary     Status: Abnormal   Collection Time: 04/16/15  3:38 PM  Result Value Ref Range   Glucose-Capillary 119 (H) 65 - 99 mg/dL   Comment 1 Notify RN    Comment 2 Document in Chart   Glucose, capillary     Status: Abnormal   Collection Time: 04/16/15  7:38 PM  Result Value Ref Range   Glucose-Capillary 120 (H) 65 - 99 mg/dL   Comment 1 Notify RN   Glucose, capillary     Status: Abnormal   Collection Time: 04/17/15 12:09 AM  Result Value Ref Range   Glucose-Capillary 149 (H) 65 - 99 mg/dL   Comment 1 Notify RN   Glucose, capillary     Status: Abnormal   Collection Time: 04/17/15  4:44 AM  Result Value Ref Range   Glucose-Capillary 120 (H) 65 - 99 mg/dL  Basic metabolic panel     Status: Abnormal   Collection Time: 04/17/15  4:45 AM  Result Value Ref Range   Sodium 142 135 - 145 mmol/L   Potassium 3.7 3.5 - 5.1 mmol/L    Comment: DELTA CHECK NOTED   Chloride 99 (L) 101 - 111 mmol/L   CO2 30 22 - 32 mmol/L   Glucose, Bld 132 (H) 65 - 99 mg/dL   BUN 57 (H) 6 - 20 mg/dL   Creatinine, Ser 1.33 (H) 0.61 - 1.24 mg/dL   Calcium 9.0 8.9 - 10.3 mg/dL   GFR calc non Af Amer 51 (L) >60 mL/min   GFR calc Af Amer 60 (L) >60 mL/min    Comment: (NOTE) The eGFR has been calculated using the CKD EPI equation. This calculation has not been validated in all clinical situations. eGFR's persistently <60 mL/min signify possible Chronic Kidney Disease.    Anion gap  13 5 - 15  CBC     Status: Abnormal   Collection Time: 04/17/15  4:45 AM  Result Value Ref Range   WBC 4.6 4.0 - 10.5 K/uL   RBC 4.59 4.22 - 5.81 MIL/uL   Hemoglobin 11.7 (L) 13.0 - 17.0 g/dL   HCT 40.5 39.0 - 52.0 %   MCV 88.2 78.0 - 100.0 fL   MCH 25.5 (L) 26.0 - 34.0 pg   MCHC 28.9 (L) 30.0 - 36.0 g/dL   RDW 18.7 (H) 11.5 - 15.5 %   Platelets 136 (L) 150 - 400 K/uL  Blood gas, arterial     Status: Abnormal   Collection Time: 04/17/15  5:38 AM  Result Value Ref Range   FIO2 80.00    Delivery systems VENTILATOR    Mode PRESSURE REGULATED VOLUME CONTROL    VT 540 mL   LHR 14 resp/min   Peep/cpap 5.0 cm H20   pH, Arterial 7.524 (H) 7.350 - 7.450   pCO2 arterial 37.5 35.0 - 45.0 mmHg   pO2, Arterial 61.4 (L) 80.0 - 100.0 mmHg   Bicarbonate 31.3 (H) 20.0 - 24.0 mEq/L   TCO2 14.8 0 - 100 mmol/L   Acid-Base Excess 7.5 (H) 0.0 - 2.0 mmol/L   O2  Saturation 91.6 %   Patient temperature 37.0    Collection site LEFT RADIAL    Drawn by [id number]    Sample type ARTERIAL    Allens test (pass/fail) PASS PASS  Glucose, capillary     Status: Abnormal   Collection Time: 04/17/15  7:44 AM  Result Value Ref Range   Glucose-Capillary 119 (H) 65 - 99 mg/dL   Comment 1 Notify RN    Comment 2 Document in Chart   Glucose, capillary     Status: Abnormal   Collection Time: 04/17/15 12:06 PM  Result Value Ref Range   Glucose-Capillary 127 (H) 65 - 99 mg/dL   Comment 1 Notify RN    Comment 2 Document in Chart   Glucose, capillary     Status: Abnormal   Collection Time: 04/17/15  4:50 PM  Result Value Ref Range   Glucose-Capillary 126 (H) 65 - 99 mg/dL   Comment 1 Notify RN    Comment 2 Document in Chart   Glucose, capillary     Status: Abnormal   Collection Time: 04/17/15  7:43 PM  Result Value Ref Range   Glucose-Capillary 111 (H) 65 - 99 mg/dL  Glucose, capillary     Status: Abnormal   Collection Time: 04/18/15 12:37 AM  Result Value Ref Range   Glucose-Capillary 162 (H) 65 - 99 mg/dL  Blood gas, arterial     Status: Abnormal   Collection Time: 04/18/15  4:05 AM  Result Value Ref Range   FIO2 0.70    Delivery systems VENTILATOR    Mode PRESSURE REGULATED VOLUME CONTROL    VT 540 mL   LHR 14.0 resp/min   Peep/cpap 5.0 cm H20   pH, Arterial 7.505 (H) 7.350 - 7.450   pCO2 arterial 38.7 35.0 - 45.0 mmHg   pO2, Arterial 65.5 (L) 80.0 - 100.0 mmHg   Bicarbonate 30.7 (H) 20.0 - 24.0 mEq/L   Acid-Base Excess 6.9 (H) 0.0 - 2.0 mmol/L   O2 Saturation 91.8 %   Collection site LEFT RADIAL    Drawn by [id number]    Sample type ARTERIAL DRAW    Allens test (pass/fail) PASS PASS  Creatinine, serum     Status: Abnormal  Collection Time: 04/18/15  4:44 AM  Result Value Ref Range   Creatinine, Ser 1.28 (H) 0.61 - 1.24 mg/dL   GFR calc non Af Amer 54 (L) >60 mL/min   GFR calc Af Amer >60 >60 mL/min    Comment: (NOTE) The eGFR has been  calculated using the CKD EPI equation. This calculation has not been validated in all clinical situations. eGFR's persistently <60 mL/min signify possible Chronic Kidney Disease.   Glucose, capillary     Status: Abnormal   Collection Time: 04/18/15  5:04 AM  Result Value Ref Range   Glucose-Capillary 119 (H) 65 - 99 mg/dL    ABGS  Recent Labs  04/17/15 0538 04/18/15 0405  PHART 7.524* 7.505*  PO2ART 61.4* 65.5*  TCO2 14.8  --   HCO3 31.3* 30.7*   CULTURES Recent Results (from the past 240 hour(s))  MRSA PCR Screening     Status: None   Collection Time: 04/11/15 11:42 AM  Result Value Ref Range Status   MRSA by PCR NEGATIVE NEGATIVE Final    Comment:        The GeneXpert MRSA Assay (FDA approved for NASAL specimens only), is one component of a comprehensive MRSA colonization surveillance program. It is not intended to diagnose MRSA infection nor to guide or monitor treatment for MRSA infections.   Culture, blood (Routine X 2) w Reflex to ID Panel     Status: None (Preliminary result)   Collection Time: 04/13/15 10:02 AM  Result Value Ref Range Status   Specimen Description BLOOD LEFT ARM  Final   Special Requests BOTTLES DRAWN AEROBIC AND ANAEROBIC 6CC EACH  Final   Culture NO GROWTH 4 DAYS  Final   Report Status PENDING  Incomplete  Culture, blood (Routine X 2) w Reflex to ID Panel     Status: None (Preliminary result)   Collection Time: 04/13/15 10:08 AM  Result Value Ref Range Status   Specimen Description BLOOD LEFT HAND  Final   Special Requests BOTTLES DRAWN AEROBIC AND ANAEROBIC 5CC EACH  Final   Culture NO GROWTH 4 DAYS  Final   Report Status PENDING  Incomplete  Culture, respiratory (NON-Expectorated)     Status: None   Collection Time: 04/14/15  9:32 PM  Result Value Ref Range Status   Specimen Description TRACHEAL ASPIRATE  Final   Special Requests NONE  Final   Gram Stain   Final    MODERATE WBC PRESENT, PREDOMINANTLY PMN NO SQUAMOUS EPITHELIAL  CELLS SEEN MODERATE GRAM POSITIVE RODS Performed at [facility]    Culture   Final    NORMAL OROPHARYNGEAL FLORA Performed at [facility]    Report Status 04/17/2015 FINAL  Final  Culture, expectorated sputum-assessment     Status: None   Collection Time: 04/14/15  9:48 PM  Result Value Ref Range Status   Specimen Description ENDOTRACHEAL  Final   Special Requests NONE  Final   Sputum evaluation THIS SPECIMEN IS ACCEPTABLE FOR SPUTUM CULTURE  Final   Report Status 04/14/2015 FINAL  Final   Studies/Results: Dg Chest Port 1 View  04/17/2015  CLINICAL DATA:  Respiratory failure. EXAM: PORTABLE CHEST 1 VIEW COMPARISON:  04/16/2015 . FINDINGS: Endotracheal tube, NG tube, right PICC line in stable position. Prior CABG. Cardiomegaly with pulmonary venous congestion. Bilateral lower lobe infiltrates and or edema noted. Bilateral pleural effusions noted. Findings suggest congestive heart failure. Bibasilar pneumonia cannot be excluded. Low lung volumes with basilar atelectasis . No pneumothorax . Prior cervical  spine fusion . IMPRESSION: 1. Lines and tubes in stable position. 2. Prior CABG. Cardiomegaly with pulmonary venous congestion. Bibasilar pulmonary infiltrates and or edema noted. Bilateral pleural effusions noted. Findings consistent with congestive heart failure. No significant change from prior exam. 3. Lung volumes with basilar atelectasis. Electronically Signed   By: Marcello Moores  Register   On: 04/17/2015 07:55   Dg Chest Port 1 View  04/16/2015  CLINICAL DATA:  Altered mental status, respiratory failure EXAM: PORTABLE CHEST 1 VIEW COMPARISON:  04/15/2015 FINDINGS: Endotracheal tube, NG tube and RIGHT PICC line are unchanged. There are low lung volumes and bilateral pleural effusions unchanged. Mild central venous congestion. No pneumothorax. IMPRESSION: 1. Stable support apparatus. 2. Low lung volumes with bilateral pleural effusions with no significant change. Electronically  Signed   By: Suzy Bouchard M.D.   On: 04/16/2015 08:47   Dg Chest Port 1v Same Day  04/16/2015  CLINICAL DATA:  Check OG placement EXAM: PORTABLE CHEST - 1 VIEW SAME DAY COMPARISON:  04/16/2015 FINDINGS: Endotracheal tube is again identified in satisfactory position. A right-sided PICC line is noted at cavoatrial junction. A orogastric catheter is seen extending into the stomach. Bilateral pleural effusions and bibasilar atelectatic changes are again seen. No acute bony abnormality is noted. IMPRESSION: Tubes and lines as described in satisfactory position. Stable bibasilar changes. Electronically Signed   By: Inez Catalina M.D.   On: 04/16/2015 20:36    Medications:  Prior to Admission:  Prescriptions prior to admission  Medication Sig Dispense Refill Last Dose  . amLODipine (NORVASC) 10 MG tablet Take 10 mg by mouth daily.   04/11/2015 at Unknown time  . atorvastatin (LIPITOR) 20 MG tablet Take 20 mg by mouth daily.   04/11/2015 at Unknown time  . clonazePAM (KLONOPIN) 1 MG tablet Take 1 mg by mouth 2 (two) times daily.   04/11/2015 at Unknown time  . clopidogrel (PLAVIX) 75 MG tablet Take 75 mg by mouth daily.   04/11/2015 at Unknown time  . esomeprazole (NEXIUM) 40 MG capsule Take 40 mg by mouth daily before breakfast.   04/11/2015 at Unknown time  . furosemide (LASIX) 40 MG tablet Take 0.5-1 tablets (20-40 mg total) by mouth every other day. Alternated a half pill with whole pill every other day (Patient taking differently: Take 40 mg by mouth daily. ) 115 tablet 3 unknown  . glimepiride (AMARYL) 2 MG tablet Take 2 mg by mouth daily.    04/11/2015 at Unknown time  . isosorbide mononitrate (IMDUR) 30 MG 24 hr tablet Take 30 mg by mouth daily.   04/11/2015 at Unknown time  . lisinopril (PRINIVIL,ZESTRIL) 2.5 MG tablet Take 2.5 mg by mouth daily.   04/11/2015 at Unknown time  . metFORMIN (GLUCOPHAGE) 500 MG tablet Take 1,000 mg by mouth 2 (two) times daily with a meal.   04/11/2015 at Unknown  time  . metoprolol (LOPRESSOR) 100 MG tablet Take 100 mg by mouth every morning. & 1/2 by mouth in evening   04/11/2015 at Unknown time  . nitroGLYCERIN (NITROSTAT) 0.4 MG SL tablet Place 0.4 mg under the tongue every 5 (five) minutes as needed.   unknown  . oxyCODONE-acetaminophen (PERCOCET) 7.5-325 MG tablet Take 1 tablet by mouth every 4 (four) hours as needed for severe pain (4-6 hours).   unknown  . tiZANidine (ZANAFLEX) 4 MG tablet Take 4 mg by mouth 2 (two) times daily.    04/11/2015 at Unknown time  . traZODone (DESYREL) 100 MG tablet Take 100 mg  by mouth at bedtime.   04/10/2015 at Unknown time  . gabapentin (NEURONTIN) 300 MG capsule Take 300 mg by mouth 3 (three) times daily.   Taking   Scheduled: . antiseptic oral rinse  7 mL Mouth Rinse QID  . azithromycin  500 mg Intravenous Q24H  . bumetanide (BUMEX) IV  2 mg Intravenous Q12H  . cefTRIAXone (ROCEPHIN)  IV  1 g Intravenous Q24H  . chlorhexidine gluconate  15 mL Mouth Rinse BID  . enoxaparin (LOVENOX) injection  40 mg Subcutaneous Q24H  . feeding supplement (PRO-STAT SUGAR FREE 64)  60 mL Per Tube TID  . feeding supplement (VITAL HIGH PROTEIN)  1,000 mL Per Tube Q24H  . insulin aspart  0-15 Units Subcutaneous 6 times per day  . ipratropium-albuterol  3 mL Nebulization Q6H  . methylPREDNISolone (SOLU-MEDROL) injection  40 mg Intravenous Q12H  . pantoprazole (PROTONIX) IV  40 mg Intravenous Daily  . sodium chloride  3 mL Intravenous Q12H   Continuous: . propofol (DIPRIVAN) infusion 50 mcg/kg/min (04/18/15 0515)   QJE:ADGNPHQN (SUBLIMAZE) injection, fentaNYL (SUBLIMAZE) injection, nitroGLYCERIN, ondansetron **OR** ondansetron (ZOFRAN) IV  Assesment: He has acute respiratory failure requiring intubation and mechanical ventilation. He is slowly improving. He has diuresed and we've been able to turn down his oxygen a little bit. I agree with Dr. Harrington Challenger that we should go ahead with CT chest. I had ordered chest x-ray this morning but  I think CT is a better test and we should go with that. I'd like to see if he is developing any necrotic infiltrate and also see what his pleural effusion looks like now. If he still has substantial pleural effusion I think we should attempt again to see about thoracentesis. Principal Problem:   Ventilator dependence ([place]) Active Problems:   Diabetes ([place])   TOBACCO USER   Essential hypertension   CORONARY ATHEROSCLEROSIS NATIVE CORONARY ARTERY   Pleural effusion, right   Acute respiratory failure with hypercapnia ([facility])   COPD with acute exacerbation ([facility])   Diastolic CHF, acute on chronic (HCC)   CAP (community acquired pneumonia)   Acute on chronic diastolic CHF (congestive heart failure), NYHA class 1 ([place])    Plan: Cancel chest x-ray this morning. CT chest today without contrast. Continue attempts to reduce FiO2. For now continue current antibiotics if it looks like he's getting any sort of necrotic pneumonia adjustment in antibiotic treatment will be needed. He may need thoracentesis. Continue diuresis    LOS: 7 days   Archie Shea L 04/18/2015, 7:22 AM

## 2015-04-18 NOTE — Progress Notes (Signed)
[facility] PROGRESS NOTE  Miguel GriceMarvin C Mendoza WUJ:[id number]:[id number] DOB: 1940/08/06    PCP:   Miguel Mendoza   HPI:   HPI: patient with hx of CAD, CHF, DM, prior CVA, HTN, GERD, admitted for hypercapneic hypoxic respiratory failure, having bilateral pleural effusion, PNA, CHF, and currently intubated requiring FIO2 of 80 percent, 5 peep, and getting TF. He is followed closely by pulmonary and cardiology. When his peep was increased this morning, he desat more, so it was reverted back to 5. IR was requested for thoracocentesis, but did not feel he had enough fluid to tap. His oxygenation has improved a little.  Bumex has helped some, and was increased by Cardiology to 2mg  IV Q12 hours.  Pulmonary planned to repeat CT chest and obtain thoracocentesis if indicated.   Rewiew of Systems: Unable.    Past Medical History  Diagnosis Date  . CAD (coronary artery disease)     Multivessel status post stent to LAD 1998 then CABG 2003  . Myocardial infarction (HCC)   . History of stroke 51997  . Type 2 diabetes mellitus (HCC)   . Essential hypertension   . Dyslipidemia   . Migraine headache   . GERD (gastroesophageal reflux disease)   . Anxiety   . Cervical disc disease     Past Surgical History  Procedure Laterality Date  . Coronary artery bypass graft  2003    Dr. Cornelius Mendoza - LIMA to LAD, SVG to OM, SVG to RCA  . Carotid endarterectomy Left   . Back surgery    . Anterior cervical decomp/discectomy fusion      Medications:  HOME MEDS: Prior to Admission medications   Medication Sig Start Date End Date Taking? Authorizing Provider  amLODipine (NORVASC) 10 MG tablet Take 10 mg by mouth daily.   Yes Historical Provider, Mendoza  atorvastatin (LIPITOR) 20 MG tablet Take 20 mg by mouth daily.   Yes Historical Provider, Mendoza  clonazePAM (KLONOPIN) 1 MG tablet Take 1 mg by mouth 2 (two) times daily.   Yes Historical Provider, Mendoza  clopidogrel (PLAVIX) 75 MG tablet Take 75 mg by mouth daily.   Yes  Historical Provider, Mendoza  esomeprazole (NEXIUM) 40 MG capsule Take 40 mg by mouth daily before breakfast.   Yes Historical Provider, Mendoza  furosemide (LASIX) 40 MG tablet Take 0.5-1 tablets (20-40 mg total) by mouth every other day. Alternated a half pill with whole pill every other day Patient taking differently: Take 40 mg by mouth daily.  03/14/15  Yes Jonelle SidleSamuel G McDowell, Mendoza  glimepiride (AMARYL) 2 MG tablet Take 2 mg by mouth daily.    Yes Historical Provider, Mendoza  isosorbide mononitrate (IMDUR) 30 MG 24 hr tablet Take 30 mg by mouth daily.   Yes Historical Provider, Mendoza  lisinopril (PRINIVIL,ZESTRIL) 2.5 MG tablet Take 2.5 mg by mouth daily.   Yes Historical Provider, Mendoza  metFORMIN (GLUCOPHAGE) 500 MG tablet Take 1,000 mg by mouth 2 (two) times daily with a meal.   Yes Historical Provider, Mendoza  metoprolol (LOPRESSOR) 100 MG tablet Take 100 mg by mouth every morning. & 1/2 by mouth in evening   Yes Historical Provider, Mendoza  nitroGLYCERIN (NITROSTAT) 0.4 MG SL tablet Place 0.4 mg under the tongue every 5 (five) minutes as needed.   Yes Historical Provider, Mendoza  oxyCODONE-acetaminophen (PERCOCET) 7.5-325 MG tablet Take 1 tablet by mouth every 4 (four) hours as needed for severe pain (4-6 hours).   Yes Historical Provider, Mendoza  tiZANidine (ZANAFLEX) 4 MG  tablet Take 4 mg by mouth 2 (two) times daily.    Yes Historical Provider, Mendoza  traZODone (DESYREL) 100 MG tablet Take 100 mg by mouth at bedtime.   Yes Historical Provider, Mendoza  gabapentin (NEURONTIN) 300 MG capsule Take 300 mg by mouth 3 (three) times daily.    Historical Provider, Mendoza     Allergies:  Allergies  Allergen Reactions  . Ibuprofen Hives    Social History:   reports that he has been smoking Cigarettes.  He has been smoking about 0.50 packs per day. He does not have any smokeless tobacco history on file. He reports that he does not drink alcohol or use illicit drugs.  Family History: Family History  Problem Relation Age of Onset  .  Heart attack Father   . Heart attack Mother   . Heart attack Brother      Physical Exam: Filed Vitals:   04/18/15 0630 04/18/15 0700 04/18/15 0730 04/18/15 0801  BP: 113/61 115/63 117/63   Pulse: 57 56 57   Temp:      TempSrc:      Resp: Height:      Weight:      SpO2: 91% 91% 89% 90%   Blood pressure 117/63, pulse 57, temperature 97.1 F (36.2 C), temperature source Oral, resp. rate 14, height  (1.676 m), weight 81.1 kg (178 lb 12.7 oz), SpO2 90 %.  GEN: intubated and sedated.  HEENT: Mucous membranes pink and anicteric; no cervical lymphadenopathy nor thyromegaly or carotid bruit; no JVD; There were no stridor. Neck is very supple. Breasts:: Not examined CHEST: Normal respiration, mechanically ventilated.  HEART: Regular rate and rhythm. There are no murmur, rub, or gallops.  BACK: No kyphosis or scoliosis; no CVA tenderness ABDOMEN: soft and non-tender; no masses, no organomegaly, normal abdominal bowel sounds; no pannus; no intertriginous candida. There is no rebound and no distention. Rectal Exam: Not done EXTREMITIES: No bone or joint deformity; age-appropriate arthropathy of the hands and knees; no edema; no ulcerations. There is no calf tenderness. Genitalia: not examined PULSES: 2+ and symmetric SKIN: Normal hydration no rash or ulceration CNS: Sedated and Ventilated.    Labs on Admission:  Basic Metabolic Panel:  Recent Labs Lab 04/12/15 0449 04/13/15 0420 04/14/15 0455 04/16/15 0441 04/17/15 0445 04/18/15 0444  NA 142 141 142 141 142  --   K 5.1 4.6 4.3 4.5 3.7  --   CL 100* 100* 105 98* 99*  --   CO2 34* 31 25 32 30  --   GLUCOSE 161* 120* 82 153* 132*  --   BUN 22* 34* 43* 57* 57*  --   CREATININE 1.44* 1.65* 1.58* 1.39* 1.33* 1.28*  CALCIUM 9.2 8.8* 8.9 8.6* 9.0  --    Liver Function Tests:  Recent Labs Lab 04/11/15 1812 04/12/15 0449  AST 15 12*  ALT 8* 7*  ALKPHOS 60 53  BILITOT 1.1 1.0  PROT 7.9 6.9  ALBUMIN 4.0  3.4*   CBC:  Recent Labs Lab 04/11/15 1812 04/12/15 0449 04/13/15 0420 04/14/15 0455 04/16/15 0441 04/17/15 0445  WBC 6.5 4.2 7.3 6.6 4.9 4.6  NEUTROABS 4.7  --   --   --   --   --   HGB 12.8* 11.8* 11.0* 12.4* 11.3* 11.7*  HCT 47.3 44.4 39.1 41.7 40.5 40.5  MCV 95.2 94.9 90.1 88.0 88.8 88.2  PLT 192 180 188 144* 149* 136*   Cardiac Enzymes:  Recent Labs  Lab 04/11/15 1812  TROPONINI 0.05*    CBG:  Recent Labs Lab 04/17/15 1650 04/17/15 1943 04/18/15 0037 04/18/15 0504 04/18/15 0757  GLUCAP 126* 111* 162* 119* 113*     Radiological Exams on Admission: Dg Chest Port 1 View  04/17/2015  CLINICAL DATA:  Respiratory failure. EXAM: PORTABLE CHEST 1 VIEW COMPARISON:  04/16/2015 . FINDINGS: Endotracheal tube, NG tube, right PICC line in stable position. Prior CABG. Cardiomegaly with pulmonary venous congestion. Bilateral lower lobe infiltrates and or edema noted. Bilateral pleural effusions noted. Findings suggest congestive heart failure. Bibasilar pneumonia cannot be excluded. Low lung volumes with basilar atelectasis . No pneumothorax . Prior cervical spine fusion . IMPRESSION: 1. Lines and tubes in stable position. 2. Prior CABG. Cardiomegaly with pulmonary venous congestion. Bibasilar pulmonary infiltrates and or edema noted. Bilateral pleural effusions noted. Findings consistent with congestive heart failure. No significant change from prior exam. 3. Lung volumes with basilar atelectasis. Electronically Signed   By: Maisie Fus  Register   On: 04/17/2015 07:55   Dg Chest Port 1 View  04/16/2015  CLINICAL DATA:  Altered mental status, respiratory failure EXAM: PORTABLE CHEST 1 VIEW COMPARISON:  04/15/2015 FINDINGS: Endotracheal tube, NG tube and RIGHT PICC line are unchanged. There are low lung volumes and bilateral pleural effusions unchanged. Mild central venous congestion. No pneumothorax. IMPRESSION: 1. Stable support apparatus. 2. Low lung volumes with bilateral pleural  effusions with no significant change. Electronically Signed   By: Genevive Bi M.D.   On: 04/16/2015 08:47   Dg Chest Port 1v Same Day  04/16/2015  CLINICAL DATA:  Check OG placement EXAM: PORTABLE CHEST - 1 VIEW SAME DAY COMPARISON:  04/16/2015 FINDINGS: Endotracheal tube is again identified in satisfactory position. A right-sided PICC line is noted at cavoatrial junction. A orogastric catheter is seen extending into the stomach. Bilateral pleural effusions and bibasilar atelectatic changes are again seen. No acute bony abnormality is noted. IMPRESSION: Tubes and lines as described in satisfactory position. Stable bibasilar changes. Electronically Signed   By: Alcide Clever M.D.   On: 04/16/2015 20:36   Assessment/Plan Present on Admission:  . Pleural effusion, right . CORONARY ATHEROSCLEROSIS NATIVE CORONARY ARTERY . Essential hypertension . TOBACCO USER  PLAN:   Acute on chronic diastolic CHF -Per history EF appears to be 50-60%. -2-D echo: EF 55-60%, grade 1 diastolic dysfunction.   IV bumex on increasing dose-Appreciate cardiology consultation.  Respiratory failure:  Obtain repeat CT without contrast.     Intend to obtain thoracocentesis if possible.    Continue with current antibiotics per pulmonary.  History of coronary artery disease -Appears stable at this venue.  Elevated troponin -Mild elevation to 0.05. Likely represents demand ischemia given complexity of overall illness and respiratory failure. -2-D echo: Ejection fraction of 55-60%, normal wall motion, grade 1 diastolic dysfunction. -No further workup planned for at present.  Acute renal failure -Improving. -Related to acute illness, ATN. -Monitor renal function. -May worsen with diuresis given acute CHF, will continue with more aggressive diuresis.    Code Status: Full code Family Communication:Wife Jackie at bedside 12/18 and updated on plan of care and all questions answered. Disposition Plan: Keep in  ICU.    Houston Siren, Mendoza.  FACP [facility] Pager [phone] 7pm to 7am.  04/18/2015, 8:06 AM

## 2015-04-18 NOTE — Progress Notes (Addendum)
Nutrition Follow-up  INTERVENTION:  Increase Vital HP to 20 ml/hr via OG tube and  60 ml Prostat TID.  Tube feeding regimen provides 1080 kcal, 132 grams of protein, and 400 ml of H2O.     NUTRITION DIAGNOSIS:   Inadequate oral intake related to inability to eat as evidenced by NPO status.  GOAL:   Provide needs based on ASPEN/SCCM guidelines   MONITOR:   Vent status, Labs, Weight trends  REASON FOR ASSESSMENT:   Ventilator/ Consult to manage tube feeding    ASSESSMENT:  Patient remains intubated on ventilator support due to acute respiratory failure. He has hx of COPD, CHF, DM and CAD.   His weight is decreased which is expected with diuretics. He was 90.7 kg on admission now down to 81.2 kg. His output documented at negative -3.4 liters since admission.   12/22 His weight is stable today 81.1 kg.  MV: 7.7 L/min Temp (24hrs), Avg:98.2 F (36.8 C), Min:97.1 F (36.2 C), Max:99.4 F (37.4 C)  12/22 Pt has been able to tolerate decreased propofol rate and therefore we'll increase his tube feeds today. He also went down for a CT. Propofol: 25.7 ml/hr -providing 678 kcal lipids at current rate every 24 hr Labs: glucose 127, BUN 57, and Cr 1.28 trending down, triglycerides 171.    Diet Order:  Diet NPO time specified  Skin:   intact  Last BM:   12/21  Height:   Ht Readings from Last 1 Encounters:  04/18/15 5\' 6"  (1.676 m)    Weight:   Wt Readings from Last 1 Encounters:  04/18/15 178 lb 12.7 oz (81.1 kg)  Admit weight-90.7 kg  Ideal Body Weight:  67 kg  BMI:  Body mass index is 28.87 kg/(m^2).  Estimated Nutritional Needs:   Kcal:  1472   Protein:  115-129 gr  Fluid:  Negative fluid balance per MD fluid goal   EDUCATION NEEDS:   No education needs identified at this time  Royann ShiversLynn Aidden Markovic MS,RD,CSG,LDN Office: #[phone] Pager: [phone]

## 2015-04-19 ENCOUNTER — Inpatient Hospital Stay (HOSPITAL_COMMUNITY): Payer: Medicare HMO

## 2015-04-19 DIAGNOSIS — J9602 Acute respiratory failure with hypercapnia: Principal | ICD-10-CM

## 2015-04-19 LAB — BLOOD GAS, ARTERIAL
Acid-Base Excess: 5.4 mmol/L — ABNORMAL HIGH (ref 0.0–2.0)
BICARBONATE: 29.1 meq/L — AB (ref 20.0–24.0)
Drawn by: 317771
FIO2: 0.65
LHR: 14 {breaths}/min
O2 SAT: 90.6 %
PEEP/CPAP: 5 cmH2O
TCO2: 15.6 mmol/L (ref 0–100)
VT: 540 mL
pCO2 arterial: 40.4 mmHg (ref 35.0–45.0)
pH, Arterial: 7.471 — ABNORMAL HIGH (ref 7.350–7.450)
pO2, Arterial: 64 mmHg — ABNORMAL LOW (ref 80.0–100.0)

## 2015-04-19 LAB — GLUCOSE, SEROUS FLUID: GLUCOSE FL: 160 mg/dL

## 2015-04-19 LAB — PROTEIN, BODY FLUID: TOTAL PROTEIN, FLUID: 4.2 g/dL

## 2015-04-19 LAB — GLUCOSE, CAPILLARY
GLUCOSE-CAPILLARY: 157 mg/dL — AB (ref 65–99)
GLUCOSE-CAPILLARY: 153 mg/dL — AB (ref 65–99)
GLUCOSE-CAPILLARY: 166 mg/dL — AB (ref 65–99)
Glucose-Capillary: 112 mg/dL — ABNORMAL HIGH (ref 65–99)
Glucose-Capillary: 114 mg/dL — ABNORMAL HIGH (ref 65–99)
Glucose-Capillary: 177 mg/dL — ABNORMAL HIGH (ref 65–99)

## 2015-04-19 LAB — BODY FLUID CELL COUNT WITH DIFFERENTIAL
EOS FL: 0 %
Lymphs, Fluid: 85 %
MONOCYTE-MACROPHAGE-SEROUS FLUID: 7 % — AB (ref 50–90)
Neutrophil Count, Fluid: 8 % (ref 0–25)
Total Nucleated Cell Count, Fluid: 627 cu mm (ref 0–1000)

## 2015-04-19 MED ORDER — VITAL HIGH PROTEIN PO LIQD
1000.0000 mL | ORAL | Status: DC
  Administered 2015-04-20 (×2): via OROGASTRIC
  Administered 2015-04-20: 1000 mL via OROGASTRIC
  Administered 2015-04-20 (×7): via OROGASTRIC
  Administered 2015-04-21 – 2015-04-22 (×2): 1000 mL via OROGASTRIC
  Administered 2015-04-22 (×2): via OROGASTRIC
  Filled 2015-04-19 (×4): qty 1000

## 2015-04-19 NOTE — Progress Notes (Signed)
Nutrition Follow-up  INTERVENTION:  Continue Vital HP to 20 ml/hr via OG tube and  60 ml Prostat TID.  Tube feeding regimen provides 1080 kcal, 132 grams of protein, and 400 ml of H2O.     NUTRITION DIAGNOSIS:   Inadequate oral intake related to inability to eat as evidenced by NPO status.  GOAL:   Provide needs based on ASPEN/SCCM guidelines   MONITOR:   Vent status, Labs, Weight trends  REASON FOR ASSESSMENT:   Ventilator/ Consult to manage tube feeding    ASSESSMENT:  Patient remains intubated on ventilator support due to acute respiratory failure. He has hx of COPD, CHF, DM and CAD.   12/22 His weight is stable today 81.1 kg.  12/23 He is s/p thoracentesis today 270 ml removed. His weight is down to 80 kg. Tolerating his tube feedings. There is no change in his sedative rate today. If he is taken off propofol and transitioned to another sedative we'll need to increase his tube feeding rate. Will continue with current nutrition care for now.  MV: 7.5 L/min Temp (24hrs), Avg:98.1 F (36.7 C), Min:97.3 F (36.3 C), Max:98.7 F (37.1 C)  Propofol: 25.7 ml/hr -providing 678 kcal lipids at current rate every 24 hr  Labs: glucose 127, BUN 57, and Cr 1.28 trending down, triglycerides 171.    Diet Order:  Diet NPO time specified  Skin:   intact  Last BM:   12/23  Height:   Ht Readings from Last 1 Encounters:  04/19/15 5\' 6"  (1.676 m)    Weight:   Wt Readings from Last 1 Encounters:  04/19/15 176 lb 5.9 oz (80 kg)  Admit weight-90.7 kg  Ideal Body Weight:  67 kg  BMI:  Body mass index is 28.48 kg/(m^2).  Estimated Nutritional Needs:   Kcal:  1472   Protein:  115-129 gr  Fluid:  Negative fluid balance per MD fluid goal   EDUCATION NEEDS:   No education needs identified at this time  Royann ShiversLynn Jacobie Stamey MS,RD,CSG,LDN Office: #[phone] Pager: [phone]

## 2015-04-19 NOTE — Progress Notes (Signed)
Subjective: He remains intubated and on the ventilator. His oxygenation is better.  Objective: Vital signs in last 24 hours: Temp:  [97.3 F (36.3 C)-98.7 F (37.1 C)] 98 F (36.7 C) (12/23 0400) Pulse Rate:  [53-77] 67 (12/23 0700) Resp:  [12-16] 16 (12/23 0700) BP: (98-129)/(55-85) 110/66 mmHg (12/23 0700) SpO2:  [88 %-99 %] 98 % (12/23 0700) FiO2 (%):  [65 %-80 %] 65 % (12/23 0400) Weight:  [80 kg (176 lb 5.9 oz)] 80 kg (176 lb 5.9 oz) (12/23 0500) Weight change: -1.1 kg (-2 lb 6.8 oz) Last BM Date: 04/18/15  Intake/Output from previous day: 12/22 0701 - 12/23 0700 In: 1251.1 [I.V.:591.1; NG/GT:360; IV Piggyback:300] Out: 2550 [Urine:2550]  PHYSICAL EXAM General appearance: Intubated sedated on mechanical ventilation Resp: rhonchi bilaterally Cardio: His heart is regular with occasional extrasystole. GI: soft, non-tender; bowel sounds normal; no masses,  no organomegaly Extremities: extremities normal, atraumatic, no cyanosis or edema  Lab Results:  Results for orders placed or performed during the hospital encounter of 04/11/15 (from the past 48 hour(s))  Glucose, capillary     Status: Abnormal   Collection Time: 04/17/15  7:44 AM  Result Value Ref Range   Glucose-Capillary 119 (H) 65 - 99 mg/dL   Comment 1 Notify RN    Comment 2 Document in Chart   Glucose, capillary     Status: Abnormal   Collection Time: 04/17/15 12:06 PM  Result Value Ref Range   Glucose-Capillary 127 (H) 65 - 99 mg/dL   Comment 1 Notify RN    Comment 2 Document in Chart   Glucose, capillary     Status: Abnormal   Collection Time: 04/17/15  4:50 PM  Result Value Ref Range   Glucose-Capillary 126 (H) 65 - 99 mg/dL   Comment 1 Notify RN    Comment 2 Document in Chart   Glucose, capillary     Status: Abnormal   Collection Time: 04/17/15  7:43 PM  Result Value Ref Range   Glucose-Capillary 111 (H) 65 - 99 mg/dL  Glucose, capillary     Status: Abnormal   Collection Time: 04/18/15 12:37 AM   Result Value Ref Range   Glucose-Capillary 162 (H) 65 - 99 mg/dL  Blood gas, arterial     Status: Abnormal   Collection Time: 04/18/15  4:05 AM  Result Value Ref Range   FIO2 0.70    Delivery systems VENTILATOR    Mode PRESSURE REGULATED VOLUME CONTROL    VT 540 mL   LHR 14.0 resp/min   Peep/cpap 5.0 cm H20   pH, Arterial 7.505 (H) 7.350 - 7.450   pCO2 arterial 38.7 35.0 - 45.0 mmHg   pO2, Arterial 65.5 (L) 80.0 - 100.0 mmHg   Bicarbonate 30.7 (H) 20.0 - 24.0 mEq/L   Acid-Base Excess 6.9 (H) 0.0 - 2.0 mmol/L   O2 Saturation 91.8 %   Collection site LEFT RADIAL    Drawn by [id number]    Sample type ARTERIAL DRAW    Allens test (pass/fail) PASS PASS  Creatinine, serum     Status: Abnormal   Collection Time: 04/18/15  4:44 AM  Result Value Ref Range   Creatinine, Ser 1.28 (H) 0.61 - 1.24 mg/dL   GFR calc non Af Amer 54 (L) >60 mL/min   GFR calc Af Amer >60 >60 mL/min    Comment: (NOTE) The eGFR has been calculated using the CKD EPI equation. This calculation has not been validated in all clinical situations. eGFR's persistently <60 mL/min signify  possible Chronic Kidney Disease.   Triglycerides     Status: Abnormal   Collection Time: 04/18/15  4:44 AM  Result Value Ref Range   Triglycerides 171 (H) <150 mg/dL  Glucose, capillary     Status: Abnormal   Collection Time: 04/18/15  5:04 AM  Result Value Ref Range   Glucose-Capillary 119 (H) 65 - 99 mg/dL  Glucose, capillary     Status: Abnormal   Collection Time: 04/18/15  7:57 AM  Result Value Ref Range   Glucose-Capillary 113 (H) 65 - 99 mg/dL   Comment 1 Notify RN    Comment 2 Document in Chart   Glucose, capillary     Status: Abnormal   Collection Time: 04/18/15  1:08 PM  Result Value Ref Range   Glucose-Capillary 123 (H) 65 - 99 mg/dL  Glucose, capillary     Status: Abnormal   Collection Time: 04/18/15  4:51 PM  Result Value Ref Range   Glucose-Capillary 133 (H) 65 - 99 mg/dL   Comment 1 Notify RN    Comment 2  Document in Chart   Glucose, capillary     Status: None   Collection Time: 04/18/15  8:36 PM  Result Value Ref Range   Glucose-Capillary 87 65 - 99 mg/dL  Glucose, capillary     Status: Abnormal   Collection Time: 04/18/15 11:31 PM  Result Value Ref Range   Glucose-Capillary 142 (H) 65 - 99 mg/dL  Blood gas, arterial     Status: Abnormal   Collection Time: 04/19/15  3:40 AM  Result Value Ref Range   FIO2 0.65    Delivery systems VENTILATOR    Mode PRESSURE REGULATED VOLUME CONTROL    VT 540 mL   LHR 14.0 resp/min   Peep/cpap 5.0 cm H20   pH, Arterial 7.471 (H) 7.350 - 7.450   pCO2 arterial 40.4 35.0 - 45.0 mmHg   pO2, Arterial 64.0 (L) 80.0 - 100.0 mmHg   Bicarbonate 29.1 (H) 20.0 - 24.0 mEq/L   TCO2 15.6 0 - 100 mmol/L   Acid-Base Excess 5.4 (H) 0.0 - 2.0 mmol/L   O2 Saturation 90.6 %   Collection site LEFT RADIAL    Drawn by [id number]    Sample type ARTERIAL DRAW    Allens test (pass/fail) PASS PASS  Glucose, capillary     Status: Abnormal   Collection Time: 04/19/15  4:13 AM  Result Value Ref Range   Glucose-Capillary 157 (H) 65 - 99 mg/dL    ABGS  Recent Labs  04/19/15 0340  PHART 7.471*  PO2ART 64.0*  TCO2 15.6  HCO3 29.1*   CULTURES Recent Results (from the past 240 hour(s))  MRSA PCR Screening     Status: None   Collection Time: 04/11/15 11:42 AM  Result Value Ref Range Status   MRSA by PCR NEGATIVE NEGATIVE Final    Comment:        The GeneXpert MRSA Assay (FDA approved for NASAL specimens only), is one component of a comprehensive MRSA colonization surveillance program. It is not intended to diagnose MRSA infection nor to guide or monitor treatment for MRSA infections.   Culture, blood (Routine X 2) w Reflex to ID Panel     Status: None   Collection Time: 04/13/15 10:02 AM  Result Value Ref Range Status   Specimen Description BLOOD LEFT ARM  Final   Special Requests BOTTLES DRAWN AEROBIC AND ANAEROBIC [place]  Final   Culture NO GROWTH 5 DAYS   Final  Report Status 04/18/2015 FINAL  Final  Culture, blood (Routine X 2) w Reflex to ID Panel     Status: None   Collection Time: 04/13/15 10:08 AM  Result Value Ref Range Status   Specimen Description BLOOD LEFT HAND  Final   Special Requests BOTTLES DRAWN AEROBIC AND ANAEROBIC 5CC EACH  Final   Culture NO GROWTH 5 DAYS  Final   Report Status 04/18/2015 FINAL  Final  Culture, respiratory (NON-Expectorated)     Status: None   Collection Time: 04/14/15  9:32 PM  Result Value Ref Range Status   Specimen Description TRACHEAL ASPIRATE  Final   Special Requests NONE  Final   Gram Stain   Final    MODERATE WBC PRESENT, PREDOMINANTLY PMN NO SQUAMOUS EPITHELIAL CELLS SEEN MODERATE GRAM POSITIVE RODS Performed at [facility]    Culture   Final    NORMAL OROPHARYNGEAL FLORA Performed at [facility]    Report Status 04/17/2015 FINAL  Final  Culture, expectorated sputum-assessment     Status: None   Collection Time: 04/14/15  9:48 PM  Result Value Ref Range Status   Specimen Description ENDOTRACHEAL  Final   Special Requests NONE  Final   Sputum evaluation THIS SPECIMEN IS ACCEPTABLE FOR SPUTUM CULTURE  Final   Report Status 04/14/2015 FINAL  Final   Studies/Results: Ct Chest Wo Contrast  04/18/2015  CLINICAL DATA:  Bilateral pleural effusions. Hypoxic respiratory failure. Pneumonia. EXAM: CT CHEST WITHOUT CONTRAST TECHNIQUE: Multidetector CT imaging of the chest was performed following the standard protocol without IV contrast. COMPARISON:  04/12/2015 FINDINGS: Endotracheal tube is in adequate position. Right-sided PICC line as tip within the SVC. Enteric tube with tip over the mid stomach. Lungs are adequately inflated and demonstrate interval improvement of a moderate size right pleural effusion with associated compressive atelectasis within the right base. Slight worsening consolidation of the posterior medial left base likely atelectasis with tiny amount of  associated pleural fluid. Subtle hazy attenuation in the perihilar regions suggesting minimal interstitial edema. Tiny calcified granuloma over the lateral left upper lobe. Airways are within normal. There is mild stable cardiomegaly. There is calcified plaque over the coronary arteries and thoracic aorta. Median sternotomy wires are present. There has been a slight decrease in size of the precarinal lymph node which now measures 1 cm likely reactive. No evidence of hilar or axillary adenopathy. Remaining mediastinal structures are within normal. Images through the upper abdomen are unchanged. Mild degenerate change of the spine. IMPRESSION: Mild interval decrease in size of a moderate size right pleural effusion with associated compressive atelectasis in the right base. Slight worsening consolidation over the posterior medial left base likely atelectasis with tiny amount of pleural fluid. Cardiomegaly with findings suggesting subtle interstitial edema. Electronically Signed   By: Marin Olp M.D.   On: 04/18/2015 13:50    Medications:  Prior to Admission:  Prescriptions prior to admission  Medication Sig Dispense Refill Last Dose  . amLODipine (NORVASC) 10 MG tablet Take 10 mg by mouth daily.   04/11/2015 at Unknown time  . atorvastatin (LIPITOR) 20 MG tablet Take 20 mg by mouth daily.   04/11/2015 at Unknown time  . clonazePAM (KLONOPIN) 1 MG tablet Take 1 mg by mouth 2 (two) times daily.   04/11/2015 at Unknown time  . clopidogrel (PLAVIX) 75 MG tablet Take 75 mg by mouth daily.   04/11/2015 at Unknown time  . esomeprazole (NEXIUM) 40 MG capsule Take 40 mg by mouth daily  before breakfast.   04/11/2015 at Unknown time  . furosemide (LASIX) 40 MG tablet Take 0.5-1 tablets (20-40 mg total) by mouth every other day. Alternated a half pill with whole pill every other day (Patient taking differently: Take 40 mg by mouth daily. ) 115 tablet 3 unknown  . glimepiride (AMARYL) 2 MG tablet Take 2 mg by mouth  daily.    04/11/2015 at Unknown time  . isosorbide mononitrate (IMDUR) 30 MG 24 hr tablet Take 30 mg by mouth daily.   04/11/2015 at Unknown time  . lisinopril (PRINIVIL,ZESTRIL) 2.5 MG tablet Take 2.5 mg by mouth daily.   04/11/2015 at Unknown time  . metFORMIN (GLUCOPHAGE) 500 MG tablet Take 1,000 mg by mouth 2 (two) times daily with a meal.   04/11/2015 at Unknown time  . metoprolol (LOPRESSOR) 100 MG tablet Take 100 mg by mouth every morning. & 1/2 by mouth in evening   04/11/2015 at Unknown time  . nitroGLYCERIN (NITROSTAT) 0.4 MG SL tablet Place 0.4 mg under the tongue every 5 (five) minutes as needed.   unknown  . oxyCODONE-acetaminophen (PERCOCET) 7.5-325 MG tablet Take 1 tablet by mouth every 4 (four) hours as needed for severe pain (4-6 hours).   unknown  . tiZANidine (ZANAFLEX) 4 MG tablet Take 4 mg by mouth 2 (two) times daily.    04/11/2015 at Unknown time  . traZODone (DESYREL) 100 MG tablet Take 100 mg by mouth at bedtime.   04/10/2015 at Unknown time  . gabapentin (NEURONTIN) 300 MG capsule Take 300 mg by mouth 3 (three) times daily.   Taking   Scheduled: . antiseptic oral rinse  7 mL Mouth Rinse QID  . azithromycin  500 mg Intravenous Q24H  . bumetanide (BUMEX) IV  2 mg Intravenous Q12H  . cefTRIAXone (ROCEPHIN)  IV  1 g Intravenous Q24H  . chlorhexidine gluconate  15 mL Mouth Rinse BID  . feeding supplement (PRO-STAT SUGAR FREE 64)  60 mL Per Tube TID  . feeding supplement (VITAL HIGH PROTEIN)  1,000 mL Per Tube Q24H  . insulin aspart  0-15 Units Subcutaneous 6 times per day  . ipratropium-albuterol  3 mL Nebulization Q6H  . methylPREDNISolone (SOLU-MEDROL) injection  40 mg Intravenous Q12H  . pantoprazole (PROTONIX) IV  40 mg Intravenous Daily  . sodium chloride  3 mL Intravenous Q12H   Continuous: . propofol (DIPRIVAN) infusion 50 mcg/kg/min (04/19/15 0500)   IRJ:JOACZYSA (SUBLIMAZE) injection, fentaNYL (SUBLIMAZE) injection, nitroGLYCERIN, ondansetron **OR**  ondansetron (ZOFRAN) IV  Assesment: He was admitted with respiratory failure with community-acquired pneumonia COPD exacerbation and acute on chronic diastolic heart failure. He is slowly improving. He is now down to 65% oxygen. Chest x-ray looks better to my view this morning Principal Problem:   Ventilator dependence ([place]) Active Problems:   Diabetes ([place])   TOBACCO USER   Essential hypertension   CORONARY ATHEROSCLEROSIS NATIVE CORONARY ARTERY   Pleural effusion, right   Acute respiratory failure with hypercapnia (HCC)   COPD with acute exacerbation (HCC)   Diastolic CHF, acute on chronic (HCC)   CAP (community acquired pneumonia)   Acute on chronic diastolic CHF (congestive heart failure), NYHA class 1 ([place])    Plan: Continue current treatments and continue attempts to wean oxygen.    LOS: 8 days   Azelea Seguin L 04/19/2015, 7:33 AM

## 2015-04-19 NOTE — Progress Notes (Signed)
[facility] PROGRESS NOTE  MELVILLE ENGEN JXB:[id number] DOB: 1940-09-30    PCP:   [name], MD   HPI:   HPI: patient with hx of CAD, CHF, DM, prior CVA, HTN, GERD, admitted for hypercapneic hypoxic respiratory failure, having bilateral pleural effusion, PNA, CHF, and intubated was requiring FIO2  Up to 80 percent, 5 peep, and getting TF. He is followed closely by pulmonary and cardiology. When his peep was increased this morning, he desat more, so it was reverted back to 5. IR was requested for thoracocentesis, but did not feel he had enough fluid to tap previously.  His oxygenation has improved a little. Bumex has helped some, and was increased by Cardiology to  IV Q12 hours. Repeat CT of the chest showed some left pleural effusion, but he has severe atelectasis of the left lower lobe.     Rewiew of Systems:  Unable.   Past Medical History  Diagnosis Date  . CAD (coronary artery disease)     Multivessel status post stent to LAD 1998 then CABG 2003  . Myocardial infarction (HCC)   . History of stroke 47  . Type 2 diabetes mellitus (HCC)   . Essential hypertension   . Dyslipidemia   . Migraine headache   . GERD (gastroesophageal reflux disease)   . Anxiety   . Cervical disc disease     Past Surgical History  Procedure Laterality Date  . Coronary artery bypass graft  2003    Dr. Cornelius Moras - LIMA to LAD, SVG to OM, SVG to RCA  . Carotid endarterectomy Left   . Back surgery    . Anterior cervical decomp/discectomy fusion      Medications:  HOME MEDS: Prior to Admission medications   Medication Sig Start Date End Date Taking? Authorizing Provider  amLODipine (NORVASC) 10 MG tablet Take 10 mg by mouth daily.   Yes Historical Provider, MD  atorvastatin (LIPITOR) 20 MG tablet Take 20 mg by mouth daily.   Yes Historical Provider, MD  clonazePAM (KLONOPIN) 1 MG tablet Take 1 mg by mouth 2 (two) times daily.   Yes Historical Provider, MD  clopidogrel (PLAVIX) 75 MG  tablet Take 75 mg by mouth daily.   Yes Historical Provider, MD  esomeprazole (NEXIUM) 40 MG capsule Take 40 mg by mouth daily before breakfast.   Yes Historical Provider, MD  furosemide (LASIX) 40 MG tablet Take 0.5-1 tablets (20-40 mg total) by mouth every other day. Alternated a half pill with whole pill every other day Patient taking differently: Take 40 mg by mouth daily.  03/14/15  Yes Jonelle Sidle, MD  glimepiride (AMARYL) 2 MG tablet Take 2 mg by mouth daily.    Yes Historical Provider, MD  isosorbide mononitrate (IMDUR) 30 MG 24 hr tablet Take 30 mg by mouth daily.   Yes Historical Provider, MD  lisinopril (PRINIVIL,ZESTRIL) 2.5 MG tablet Take 2.5 mg by mouth daily.   Yes Historical Provider, MD  metFORMIN (GLUCOPHAGE) 500 MG tablet Take 1,000 mg by mouth 2 (two) times daily with a meal.   Yes Historical Provider, MD  metoprolol (LOPRESSOR) 100 MG tablet Take 100 mg by mouth every morning. & 1/2 by mouth in evening   Yes Historical Provider, MD  nitroGLYCERIN (NITROSTAT) 0.4 MG SL tablet Place 0.4 mg under the tongue every 5 (five) minutes as needed.   Yes Historical Provider, MD  oxyCODONE-acetaminophen (PERCOCET) 7.5-325 MG tablet Take 1 tablet by mouth every 4 (four) hours as needed for severe pain (  4-6 hours).   Yes Historical Provider, MD  tiZANidine (ZANAFLEX) 4 MG tablet Take 4 mg by mouth 2 (two) times daily.    Yes Historical Provider, MD  traZODone (DESYREL) 100 MG tablet Take 100 mg by mouth at bedtime.   Yes Historical Provider, MD  gabapentin (NEURONTIN) 300 MG capsule Take 300 mg by mouth 3 (three) times daily.    Historical Provider, MD     Allergies:  Allergies  Allergen Reactions  . Ibuprofen Hives    Social History:   reports that he has been smoking Cigarettes.  He has been smoking about 0.50 packs per day. He does not have any smokeless tobacco history on file. He reports that he does not drink alcohol or use illicit drugs.  Family History: Family History   Problem Relation Age of Onset  . Heart attack Father   . Heart attack Mother   . Heart attack Brother      Physical Exam: Filed Vitals:   04/19/15 0600 04/19/15 0700 04/19/15 0800 04/19/15 0807  BP: 104/61 110/66 113/68   Pulse: 67 67 64   Temp:   97.6 F (36.4 C)   TempSrc:   Oral   Resp: 14 16 17    Height:    5\' 6"  (1.676 m)  Weight:      SpO2: 97% 98% 92%    Blood pressure 113/68, pulse 64, temperature 97.6 F (36.4 C), temperature source Oral, resp. rate 17, height 5\' 6"  (1.676 m), weight 80 kg (176 lb 5.9 oz), SpO2 92 %.  GEN: intubated and sedated.  HEENT: Mucous membranes pink and anicteric; no cervical lymphadenopathy nor thyromegaly or carotid bruit; no JVD; There were no stridor. Neck is very supple. Breasts:: Not examined CHEST: Normal respiration, mechanically ventilated.  HEART: Regular rate and rhythm. There are no murmur, rub, or gallops.  BACK: No kyphosis or scoliosis; no CVA tenderness ABDOMEN: soft and non-tender; no masses, no organomegaly, normal abdominal bowel sounds; no pannus; no intertriginous candida. There is no rebound and no distention. Rectal Exam: Not done EXTREMITIES: No bone or joint deformity; age-appropriate arthropathy of the hands and knees; no edema; no ulcerations. There is no calf tenderness. Genitalia: not examined PULSES: 2+ and symmetric SKIN: Normal hydration no rash or ulceration CNS: Sedated and Ventilated.    Labs on Admission:  Basic Metabolic Panel:  Recent Labs Lab 04/13/15 0420 04/14/15 0455 04/16/15 0441 04/17/15 0445 04/18/15 0444  NA 141 142 141 142  --   K 4.6 4.3 4.5 3.7  --   CL 100* 105 98* 99*  --   CO2 31 25 32 30  --   GLUCOSE 120* 82 153* 132*  --   BUN 34* 43* 57* 57*  --   CREATININE 1.65* 1.58* 1.39* 1.33* 1.28*  CALCIUM 8.8* 8.9 8.6* 9.0  --    CBC:  Recent Labs Lab 04/13/15 0420 04/14/15 0455 04/16/15 0441 04/17/15 0445  WBC 7.3 6.6 4.9 4.6  HGB 11.0* 12.4* 11.3* 11.7*  HCT  39.1 41.7 40.5 40.5  MCV 90.1 88.0 88.8 88.2  PLT 188 144* 149* 136*   CBG:  Recent Labs Lab 04/18/15 1651 04/18/15 2036 04/18/15 2331 04/19/15 0413 04/19/15 0754  GLUCAP 133* 87 142* 157* 112*    Radiological Exams on Admission: Ct Chest Wo Contrast  04/18/2015  CLINICAL DATA:  Bilateral pleural effusions. Hypoxic respiratory failure. Pneumonia. EXAM: CT CHEST WITHOUT CONTRAST TECHNIQUE: Multidetector CT imaging of the chest was performed following the standard protocol without  IV contrast. COMPARISON:  04/12/2015 FINDINGS: Endotracheal tube is in adequate position. Right-sided PICC line as tip within the SVC. Enteric tube with tip over the mid stomach. Lungs are adequately inflated and demonstrate interval improvement of a moderate size right pleural effusion with associated compressive atelectasis within the right base. Slight worsening consolidation of the posterior medial left base likely atelectasis with tiny amount of associated pleural fluid. Subtle hazy attenuation in the perihilar regions suggesting minimal interstitial edema. Tiny calcified granuloma over the lateral left upper lobe. Airways are within normal. There is mild stable cardiomegaly. There is calcified plaque over the coronary arteries and thoracic aorta. Median sternotomy wires are present. There has been a slight decrease in size of the precarinal lymph node which now measures 1 cm likely reactive. No evidence of hilar or axillary adenopathy. Remaining mediastinal structures are within normal. Images through the upper abdomen are unchanged. Mild degenerate change of the spine. IMPRESSION: Mild interval decrease in size of a moderate size right pleural effusion with associated compressive atelectasis in the right base. Slight worsening consolidation over the posterior medial left base likely atelectasis with tiny amount of pleural fluid. Cardiomegaly with findings suggesting subtle interstitial edema. Electronically Signed    By: Elberta Fortis M.D.   On: 04/18/2015 13:50   Dg Chest Port 1 View  04/19/2015  CLINICAL DATA:  Ventilator.  Respiratory failure EXAM: PORTABLE CHEST 1 VIEW COMPARISON:  04/17/2015 FINDINGS: Prior CABG. There is cardiomegaly. Bilateral lower lobe opacities with layering effusions. Some improvement in aeration at the right lung base since prior study. Otherwise no change. IMPRESSION: Some improvement in aeration at the right lung base. Otherwise no change. Electronically Signed   By: Charlett Nose M.D.   On: 04/19/2015 08:11    Assessment/Plan Present on Admission:  . Pleural effusion, right . CORONARY ATHEROSCLEROSIS NATIVE CORONARY ARTERY . Essential hypertension . TOBACCO USER  PLAN:  Other plans as per orders.Acute on chronic diastolic CHF -Per history EF appears to be 50-60%. -2-D echo: EF 55-60%, grade 1 diastolic dysfunction.  IV bumex on increasing dose-Appreciate cardiology consultation.  Respiratory failure: Obtain repeat CT without contrast.   Intend to obtain thoracocentesis if possible for today.  I spoke with Dr [name] and Dr Juanetta Gosling.  All agreed. Lovenox was held.   Continue with current antibiotics per pulmonary.   If prolong intubation, will need tracheostomy.  Currently at 65 percent FIO2, will need continuation of weaning.   History of coronary artery disease -Appears stable at this venue.  Elevated troponin -Mild elevation to 0.05. Likely represents demand ischemia given complexity of overall illness and respiratory failure. -2-D echo: Ejection fraction of 55-60%, normal wall motion, grade 1 diastolic dysfunction. -No further workup planned for at present.  Acute renal failure -Improving. -Related to acute illness, ATN. -Monitor renal function. -May worsen with diuresis given acute CHF, will continue with more aggressive diuresis.    Code Status: Full code Family Communication:Wife Jackie at bedside 12/18 and updated on plan of care and all questions  answered. Disposition Plan: Keep in ICU.   Houston Siren, MD.  FACP [facility] Pager [phone] 7pm to 7am.  04/19/2015, 8:41 AM

## 2015-04-19 NOTE — Progress Notes (Addendum)
Consulting cardiologist: Tenny Crawoss Primary [facility]ologist[facility]  Cardiology Specific Problem List: 1. 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 CAD s/p CABG 3. Hypertension  Subjective:    Sedated and intubated.  Objective:   Temp:  [97.3 F (36.3 C)-98.7 F (37.1 C)] 98 F (36.7 C) (12/23 0400) Pulse Rate:  [53-77] 67 (12/23 0700) Resp:  [12-16] 16 (12/23 0700) BP: (98-129)/(55-85) 110/66 mmHg (12/23 0700) SpO2:  [88 %-99 %] 98 % (12/23 0700) FiO2 (%):  [65 %-80 %] 65 % (12/23 0400) Weight:  [176 lb 5.9 oz (80 kg)] 176 lb 5.9 oz (80 kg) (12/23 0500) Last BM Date: 04/18/15  Filed Weights   04/17/15 0500 04/18/15 0500 04/19/15 0500  Weight: 179 lb 0.2 oz (81.2 kg) 178 lb 12.7 oz (81.1 kg) 176 lb 5.9 oz (80 kg)    Intake/Output Summary (Last 24 hours) at 04/19/15 0753 Last data filed at 04/19/15 0600  Gross per 24 hour  Intake 1251.1 ml  Output   2550 ml  Net -1298.9 ml    Telemetry: SR with PAC's   Exam:  General:  Pt intubated  Awake at times  NAD   HEENT: Conjunctiva and lids normal, oropharynx clear.  Lungs: Clear to auscultation, nonlabored.  Decreased BS at R base    Cardiac: No elevated JVP or bruits. RRR, no gallop or rub.   Abdomen: Normoactive bowel sounds, nontender, nondistended.  Extremities: No pitting edema, distal pulses full.  Neuropsychiatric: Sedated   Lab Results:  Basic Metabolic Panel:  Recent Labs Lab 04/14/15 0455 04/16/15 0441 04/17/15 0445 04/18/15 0444  NA 142 141 142  --   K 4.3 4.5 3.7  --   CL 105 98* 99*  --   CO2 25 32 30  --   GLUCOSE 82 153* 132*  --   BUN 43* 57* 57*  --   CREATININE 1.58* 1.39* 1.33* 1.28*  CALCIUM 8.9 8.6* 9.0  --    CBC:  Recent Labs Lab 04/14/15 0455 04/16/15 0441 04/17/15 0445  WBC 6.6 4.9 4.6  HGB 12.4* 11.3* 11.7*  HCT 41.7 40.5 40.5  MCV 88.0 88.8 88.2  PLT 144* 149* 136*   Echocardiogram 04/12/2015  Left ventricle: The cavity size was normal. Systolic  function was normal. The estimated ejection fraction was in the range of 55% to 60%. Wall motion was normal; there were no regional wall motion abnormalities. Doppler parameters are consistent with abnormal left ventricular relaxation (grade 1 diastolic dysfunction). Mild basal septal hypertrophy. - Ventricular septum: Septal motion showed abnormal function and dyssynergy. These changes are consistent with intraventricular conduction delay. - Aortic valve: Moderately to severely calcified annulus. Trileaflet; mildly thickened, mildly calcified leaflets. 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 Peak velocity (S): 247 cm/s. Mean gradient (S): 12 mm Hg. Valve area (Vmean): 1.45 cm^2. - Mitral valve: Calcified annulus. Mildly thickened leaflets . - Left atrium: The atrium was mildly to moderately dilated. - Right ventricle: The cavity size was moderately dilated. Systolic function was moderately reduced. - Tricuspid valve: There was mild regurgitation. - Systemic veins: Unable to accurately assess respiratory variation as patient is ventilated.  Radiology: Ct Chest Wo Contrast  04/18/2015  CLINICAL DATA:  Bilateral pleural effusions. Hypoxic respiratory failure. Pneumonia. EXAM: CT CHEST WITHOUT CONTRAST TECHNIQUE: Multidetector CT imaging of the chest was performed following the standard protocol without IV contrast. COMPARISON:  04/12/2015 FINDINGS: Endotracheal tube is in adequate position. Right-sided PICC line as tip within the SVC. Enteric tube with tip over  the mid stomach. Lungs are adequately inflated and demonstrate interval improvement of a moderate size right pleural effusion with associated compressive atelectasis within the right base. Slight worsening consolidation of the posterior medial left base likely atelectasis with tiny amount of associated pleural fluid. Subtle hazy attenuation in the perihilar regions suggesting minimal interstitial edema. Tiny calcified granuloma  over the lateral left upper lobe. Airways are within normal. There is mild stable cardiomegaly. There is calcified plaque over the coronary arteries and thoracic aorta. Median sternotomy wires are present. There has been a slight decrease in size of the precarinal lymph node which now measures 1 cm likely reactive. No evidence of hilar or axillary adenopathy. Remaining mediastinal structures are within normal. Images through the upper abdomen are unchanged. Mild degenerate change of the spine. IMPRESSION: Mild interval decrease in size of a moderate size right pleural effusion with associated compressive atelectasis in the right base. Slight worsening consolidation over the posterior medial left base likely atelectasis with tiny amount of pleural fluid. Cardiomegaly with findings suggesting subtle interstitial edema. Electronically Signed   By: Elberta Fortis M.D.   On: 04/18/2015 13:50     Medications:   Scheduled Medications: . antiseptic oral rinse  7 mL Mouth Rinse QID  . azithromycin  500 mg Intravenous Q24H  . bumetanide (BUMEX) IV  2 mg Intravenous Q12H  . cefTRIAXone (ROCEPHIN)  IV  1 g Intravenous Q24H  . chlorhexidine gluconate  15 mL Mouth Rinse BID  . feeding supplement (PRO-STAT SUGAR FREE 64)  60 mL Per Tube TID  . feeding supplement (VITAL HIGH PROTEIN)  1,000 mL Per Tube Q24H  . insulin aspart  0-15 Units Subcutaneous 6 times per day  . ipratropium-albuterol  3 mL Nebulization Q6H  . methylPREDNISolone (SOLU-MEDROL) injection  40 mg Intravenous Q12H  . pantoprazole (PROTONIX) IV  40 mg Intravenous Daily  . sodium chloride  3 mL Intravenous Q12H    Infusions: . propofol (DIPRIVAN) infusion 50 mcg/kg/min (04/19/15 0500)    PRN Medications: fentaNYL (SUBLIMAZE) injection, fentaNYL (SUBLIMAZE) injection, nitroGLYCERIN, ondansetron **OR** ondansetron (ZOFRAN) IV   Assessment and Plan:   1. Acute on chronic diastolic CHF  Pt has diuresed over 6 L   Still with pleural effusion   Agree with attempt to thoracentesis to improve oxygenation  Keep on current diuretic regimen  Follow renal function and exam   2, CAD: Hx of CABG: No symptoms to suggest angina  3  Bradycardia  Improved   3. VDRF with pneumonia: Management and antibiotics per Dr. Juanetta Gosling.  Plan as noted in 1    Kathryn M. Lawrence NP [facility]  04/19/2015, 7:53 AM   Pt seen and examined  I have amended note above to reflect my findings.

## 2015-04-19 NOTE — Procedures (Signed)
PreOperative Dx: RIGHT pleural effusion Postoperative Dx: RIGHT pleural effusion Procedure:   US guided RIGHT thoracentesis Radiologist:  Tyron RussellBoles Anesthesia:  10 ml of 1% lidocaine Specimen:  270 ml of yellow colored fluid EBL:   < 1 ml Complications: None

## 2015-04-19 NOTE — Care Management Important Message (Signed)
Important Message  Patient Details  Name: Miguel Mendoza MRN: [id number] Date of Birth: Jul 30, 1940   Medicare Important Message Given:  Yes    [name], Cyntia Staley Demske, RN 04/19/2015, 9:07 AM

## 2015-04-20 LAB — BLOOD GAS, ARTERIAL
Acid-Base Excess: 6.1 mmol/L — ABNORMAL HIGH (ref 0.0–2.0)
BICARBONATE: 29.7 meq/L — AB (ref 20.0–24.0)
Drawn by: 382351
FIO2: 0.65
LHR: 14 {breaths}/min
O2 Saturation: 92.6 %
PATIENT TEMPERATURE: 37
PCO2 ART: 42.8 mmHg (ref 35.0–45.0)
PEEP: 5 cmH2O
PO2 ART: 70.6 mmHg — AB (ref 80.0–100.0)
VT: 540 mL
pH, Arterial: 7.461 — ABNORMAL HIGH (ref 7.350–7.450)

## 2015-04-20 LAB — GLUCOSE, CAPILLARY
GLUCOSE-CAPILLARY: 142 mg/dL — AB (ref 65–99)
GLUCOSE-CAPILLARY: 146 mg/dL — AB (ref 65–99)
Glucose-Capillary: 127 mg/dL — ABNORMAL HIGH (ref 65–99)
Glucose-Capillary: 129 mg/dL — ABNORMAL HIGH (ref 65–99)
Glucose-Capillary: 162 mg/dL — ABNORMAL HIGH (ref 65–99)
Glucose-Capillary: 167 mg/dL — ABNORMAL HIGH (ref 65–99)

## 2015-04-20 LAB — PH, BODY FLUID: pH, Body Fluid: 8.4

## 2015-04-20 LAB — GRAM STAIN

## 2015-04-20 NOTE — Progress Notes (Signed)
[facility] PROGRESS NOTE  Miguel Mendoza:[id number] DOB: 02-Jan-1941    PCP:   [name], MD   HPI:  patient with hx of CAD, CHF, DM, prior CVA, HTN, GERD, admitted for hypercapneic hypoxic respiratory failure, having bilateral pleural effusions, PNA, CHF, and intubated was requiring FIO2 Up to 80 percent, 5 peep, and getting TF. He is followed closely by pulmonary and cardiology. When his peep was increased  he desat more, so it was reverted back to 5. IR was requested for thoracocentesis, but did not feel he had enough fluid to tap previously. His oxygenation has improved a little. Bumex has helped some, and was increased by Cardiology to 2mg  IV Q12 hours. Repeat CT of the chest showed some left pleural effusion, and he has severe atelectasis of the left lower lobe. Dr [name] performed thoracocentesis, and removed 270 cc of parapneumonic effusion.   He improved very slowly with diuresis.     Rewiew of Systems:  Unable.   Past Medical History  Diagnosis Date  . CAD (coronary artery disease)     Multivessel status post stent to LAD 1998 then CABG 2003  . Myocardial infarction (HCC)   . History of stroke 45  . Type 2 diabetes mellitus (HCC)   . Essential hypertension   . Dyslipidemia   . Migraine headache   . GERD (gastroesophageal reflux disease)   . Anxiety   . Cervical disc disease     Past Surgical History  Procedure Laterality Date  . Coronary artery bypass graft  2003    Dr. Cornelius Moras - LIMA to LAD, SVG to OM, SVG to RCA  . Carotid endarterectomy Left   . Back surgery    . Anterior cervical decomp/discectomy fusion      Medications:  HOME MEDS: Prior to Admission medications   Medication Sig Start Date End Date Taking? Authorizing Provider  amLODipine (NORVASC) 10 MG tablet Take 10 mg by mouth daily.   Yes Historical Provider, MD  atorvastatin (LIPITOR) 20 MG tablet Take 20 mg by mouth daily.   Yes Historical Provider, MD  clonazePAM (KLONOPIN) 1 MG  tablet Take 1 mg by mouth 2 (two) times daily.   Yes Historical Provider, MD  clopidogrel (PLAVIX) 75 MG tablet Take 75 mg by mouth daily.   Yes Historical Provider, MD  esomeprazole (NEXIUM) 40 MG capsule Take 40 mg by mouth daily before breakfast.   Yes Historical Provider, MD  furosemide (LASIX) 40 MG tablet Take 0.5-1 tablets (20-40 mg total) by mouth every other day. Alternated a half pill with whole pill every other day Patient taking differently: Take 40 mg by mouth daily.  03/14/15  Yes Jonelle Sidle, MD  glimepiride (AMARYL) 2 MG tablet Take 2 mg by mouth daily.    Yes Historical Provider, MD  isosorbide mononitrate (IMDUR) 30 MG 24 hr tablet Take 30 mg by mouth daily.   Yes Historical Provider, MD  lisinopril (PRINIVIL,ZESTRIL) 2.5 MG tablet Take 2.5 mg by mouth daily.   Yes Historical Provider, MD  metFORMIN (GLUCOPHAGE) 500 MG tablet Take 1,000 mg by mouth 2 (two) times daily with a meal.   Yes Historical Provider, MD  metoprolol (LOPRESSOR) 100 MG tablet Take 100 mg by mouth every morning. & 1/2 by mouth in evening   Yes Historical Provider, MD  nitroGLYCERIN (NITROSTAT) 0.4 MG SL tablet Place 0.4 mg under the tongue every 5 (five) minutes as needed.   Yes Historical Provider, MD  oxyCODONE-acetaminophen (PERCOCET) 7.5-325 MG tablet  Take 1 tablet by mouth every 4 (four) hours as needed for severe pain (4-6 hours).   Yes Historical Provider, MD  tiZANidine (ZANAFLEX) 4 MG tablet Take 4 mg by mouth 2 (two) times daily.    Yes Historical Provider, MD  traZODone (DESYREL) 100 MG tablet Take 100 mg by mouth at bedtime.   Yes Historical Provider, MD  gabapentin (NEURONTIN) 300 MG capsule Take 300 mg by mouth 3 (three) times daily.    Historical Provider, MD     Allergies:  Allergies  Allergen Reactions  . Ibuprofen Hives    Social History:   reports that he has been smoking Cigarettes.  He has been smoking about 0.50 packs per day. He does not have any smokeless tobacco history on  file. He reports that he does not drink alcohol or use illicit drugs.  Family History: Family History  Problem Relation Age of Onset  . Heart attack Father   . Heart attack Mother   . Heart attack Brother      Physical Exam: Filed Vitals:   04/20/15 0735 04/20/15 0743 04/20/15 0800 04/20/15 0900  BP:   104/70 101/75  Pulse:   71 79  Temp:  97.9 F (36.6 C)    TempSrc:  Axillary    Resp:   14 14  Height:      Weight:      SpO2: 91%  91% 91%   Blood pressure 101/75, pulse 79, temperature 97.9 F (36.6 C), temperature source Axillary, resp. rate 14, height  (1.676 m), weight 76.5 kg (168 lb 10.4 oz), SpO2 91 %.   GEN: intubated and sedated.  HEENT: Mucous membranes pink and anicteric; no cervical lymphadenopathy nor thyromegaly or carotid bruit; no JVD; There were no stridor. Neck is very supple. Breasts:: Not examined CHEST: Normal respiration, mechanically ventilated.  HEART: Regular rate and rhythm. There are no murmur, rub, or gallops.  BACK: No kyphosis or scoliosis; no CVA tenderness ABDOMEN: soft and non-tender; no masses, no organomegaly, normal abdominal bowel sounds; no pannus; no intertriginous candida. There is no rebound and no distention. Rectal Exam: Not done EXTREMITIES: No bone or joint deformity; age-appropriate arthropathy of the hands and knees; no edema; no ulcerations. There is no calf tenderness. Genitalia: not examined PULSES: 2+ and symmetric SKIN: Normal hydration no rash or ulceration CNS: Sedated and Ventilated.    Recent Labs Lab 04/14/15 0455 04/16/15 0441 04/17/15 0445 04/18/15 0444  NA 142 141 142  --   K 4.3 4.5 3.7  --   CL 105 98* 99*  --   CO2 25 32 30  --   GLUCOSE 82 153* 132*  --   BUN 43* 57* 57*  --   CREATININE 1.58* 1.39* 1.33* 1.28*  CALCIUM 8.9 8.6* 9.0  --    CBC:  Recent Labs Lab 04/14/15 0455 04/16/15 0441 04/17/15 0445  WBC 6.6 4.9 4.6  HGB 12.4* 11.3* 11.7*  HCT 41.7 40.5 40.5  MCV 88.0 88.8  88.2  PLT 144* 149* 136*   CBG:  Recent Labs Lab 04/19/15 1553 04/19/15 1950 04/19/15 2308 04/20/15 0503 04/20/15 0733  GLUCAP 166* 114* 177* 127* 162*     Radiological Exams on Admission: Ct Chest Wo Contrast  04/18/2015  CLINICAL DATA:  Bilateral pleural effusions. Hypoxic respiratory failure. Pneumonia. EXAM: CT CHEST WITHOUT CONTRAST TECHNIQUE: Multidetector CT imaging of the chest was performed following the standard protocol without IV contrast. COMPARISON:  04/12/2015 FINDINGS: Endotracheal tube is in adequate position.  Right-sided PICC line as tip within the SVC. Enteric tube with tip over the mid stomach. Lungs are adequately inflated and demonstrate interval improvement of a moderate size right pleural effusion with associated compressive atelectasis within the right base. Slight worsening consolidation of the posterior medial left base likely atelectasis with tiny amount of associated pleural fluid. Subtle hazy attenuation in the perihilar regions suggesting minimal interstitial edema. Tiny calcified granuloma over the lateral left upper lobe. Airways are within normal. There is mild stable cardiomegaly. There is calcified plaque over the coronary arteries and thoracic aorta. Median sternotomy wires are present. There has been a slight decrease in size of the precarinal lymph node which now measures 1 cm likely reactive. No evidence of hilar or axillary adenopathy. Remaining mediastinal structures are within normal. Images through the upper abdomen are unchanged. Mild degenerate change of the spine. IMPRESSION: Mild interval decrease in size of a moderate size right pleural effusion with associated compressive atelectasis in the right base. Slight worsening consolidation over the posterior medial left base likely atelectasis with tiny amount of pleural fluid. Cardiomegaly with findings suggesting subtle interstitial edema. Electronically Signed   By: Elberta Fortis M.D.   On: 04/18/2015  13:50   Dg Chest Port 1 View  04/19/2015  CLINICAL DATA:  RIGHT pleural effusion post thoracentesis EXAM: PORTABLE CHEST 1 VIEW COMPARISON:  Portable exam 1412 hours compared to 0602 hours FINDINGS: Decrease in RIGHT pleural effusion and RIGHT basilar atelectasis versus earlier study. No definite pneumothorax. Stable endotracheal tube, nasogastric tube and RIGHT arm PICC line. Enlargement of cardiac silhouette post CABG. Atelectasis and effusion at LEFT base remain. Atherosclerotic calcification aorta. IMPRESSION: No pneumothorax following RIGHT thoracentesis. Electronically Signed   By: Ulyses Southward M.D.   On: 04/19/2015 14:37   Dg Chest Port 1 View  04/19/2015  CLINICAL DATA:  Ventilator.  Respiratory failure EXAM: PORTABLE CHEST 1 VIEW COMPARISON:  04/17/2015 FINDINGS: Prior CABG. There is cardiomegaly. Bilateral lower lobe opacities with layering effusions. Some improvement in aeration at the right lung base since prior study. Otherwise no change. IMPRESSION: Some improvement in aeration at the right lung base. Otherwise no change. Electronically Signed   By: Charlett Nose M.D.   On: 04/19/2015 08:11   US Thoracentesis Asp Pleural Space W/img Guide  04/19/2015  CLINICAL DATA:  RIGHT pleural effusion, RIGHT lower lobe atelectasis, high oxygen requirements, on ventilator EXAM: ULTRASOUND GUIDED DIAGNOSTIC AND THERAPEUTIC RIGHT THORACENTESIS COMPARISON:  Chest CT 04/17/2014 PROCEDURE: Procedure, benefits, and risks of procedure were discussed with patient's wife. Written informed consent for procedure was obtained. Time out protocol followed. Procedure performed portably in ICU. Patient placed in an LPO projection. Pleural effusion localized by ultrasound at the lateral RIGHT hemithorax. Skin prepped and draped in usual sterile fashion. Skin and soft tissues anesthetized with 10 mL of 1% lidocaine. Under direct sonographic visualization, 5 [place] Yueh catheter placed into the RIGHT pleural space. 270 mL  of yellow RIGHT pleural fluid was aspirated by syringe. Due to limited positioning of patient for access of fluid, was unable to aspirate additional fluid. Procedure tolerated well by patient without immediate complication. COMPLICATIONS: None. FINDINGS: A total of approximately 270 mL of RIGHT pleural fluid was removed. A fluid sample of 180 mL was sent for laboratory analysis. IMPRESSION: Successful ultrasound guided RIGHT thoracentesis yielding 270 mL of pleural fluid. Electronically Signed   By: Ulyses Southward M.D.   On: 04/19/2015 14:42   Assessment/Plan   Other plans as per orders.Acute on chronic  diastolic CHF -Per history EF appears to be 50-60%. -2-D echo: EF 55-60%, grade 1 diastolic dysfunction.  IV bumex on increasing dose-Appreciate cardiology consultation.  Respiratory failure: Obtain repeat CT without contrast.   Intend to obtain thoracocentesis if possible for today. I spoke with Dr Tyron RussellBoles and Dr Juanetta GoslingHawkins. All agreed. Lovenox was held.   Continue with current antibiotics per pulmonary.  If prolong intubation, will need tracheostomy. Currently at 65 percent FIO2, will need continuation of weaning.   History of coronary artery disease -Appears stable at this venue.  Elevated troponin -Mild elevation to 0.05. Likely represents demand ischemia given complexity of overall illness and respiratory failure. -2-D echo: Ejection fraction of 55-60%, normal wall motion, grade 1 diastolic dysfunction. -No further workup planned for at present.  Acute renal failure -Improving. -Related to acute illness, ATN. -Monitor renal function. -May worsen with diuresis given acute CHF, will continue with more aggressive diuresis.    Code Status: Full code Family Communication:Wife Jackie at bedside 12/18 and updated on plan of care and all questions answered. Disposition Plan: Keep in ICU. Other plans as per orders.   Houston SirenLE,Tay Whitwell, MD.  FACP [facility] Pager [phone] 7pm to  7am.  04/20/2015, 9:52 AM

## 2015-04-20 NOTE — Progress Notes (Signed)
DR LE IN TO TALK TO FAMILY ABOUT THEIR CONCERNS AND DESIRES IN THE EVENT OF A SUDDEN DECLINE IN PT'S CONDITION. THE FAMILY IS GOING TO GO HOME AND DECIDE.

## 2015-04-20 NOTE — Progress Notes (Signed)
Subjective: He remains intubated and on the ventilator. He is slowly improving. He had thoracentesis yesterday and his findings are consistent with a parapneumonic effusion not empyema. He has now diuresed about 7-1/2 L since admission  Objective: Vital signs in last 24 hours: Temp:  [97.9 F (36.6 C)-98.5 F (36.9 C)] 97.9 F (36.6 C) (12/24 0743) Pulse Rate:  [64-81] 79 (12/24 0900) Resp:  [14-28] 14 (12/24 0900) BP: (85-151)/(56-86) 101/75 mmHg (12/24 0900) SpO2:  [89 %-97 %] 91 % (12/24 0900) FiO2 (%):  [63 %-65 %] 65 % (12/24 0735) Weight:  [76.5 kg (168 lb 10.4 oz)] 76.5 kg (168 lb 10.4 oz) (12/24 0615) Weight change: -3.5 kg (-7 lb 11.5 oz) Last BM Date: 04/19/15  Intake/Output from previous day: 12/23 0701 - 12/24 0700 In: 1422.5 [I.V.:642.5; NG/GT:480; IV Piggyback:300] Out: 2550 [Emesis/NG output:2550]  PHYSICAL EXAM General appearance: Intubated sedated on mechanical ventilation Resp: rhonchi bilaterally Cardio: regular rate and rhythm, S1, S2 normal, no murmur, click, rub or gallop GI: soft, non-tender; bowel sounds normal; no masses,  no organomegaly Extremities: extremities normal, atraumatic, no cyanosis or edema  Lab Results:  Results for orders placed or performed during the hospital encounter of 04/11/15 (from the past 48 hour(s))  Glucose, capillary     Status: Abnormal   Collection Time: 04/18/15  1:08 PM  Result Value Ref Range   Glucose-Capillary 123 (H) 65 - 99 mg/dL  Glucose, capillary     Status: Abnormal   Collection Time: 04/18/15  4:51 PM  Result Value Ref Range   Glucose-Capillary 133 (H) 65 - 99 mg/dL   Comment 1 Notify RN    Comment 2 Document in Chart   Glucose, capillary     Status: None   Collection Time: 04/18/15  8:36 PM  Result Value Ref Range   Glucose-Capillary 87 65 - 99 mg/dL  Glucose, capillary     Status: Abnormal   Collection Time: 04/18/15 11:31 PM  Result Value Ref Range   Glucose-Capillary 142 (H) 65 - 99 mg/dL  Blood  gas, arterial     Status: Abnormal   Collection Time: 04/19/15  3:40 AM  Result Value Ref Range   FIO2 0.65    Delivery systems VENTILATOR    Mode PRESSURE REGULATED VOLUME CONTROL    VT 540 mL   LHR 14.0 resp/min   Peep/cpap 5.0 cm H20   pH, Arterial 7.471 (H) 7.350 - 7.450   pCO2 arterial 40.4 35.0 - 45.0 mmHg   pO2, Arterial 64.0 (L) 80.0 - 100.0 mmHg   Bicarbonate 29.1 (H) 20.0 - 24.0 mEq/L   TCO2 15.6 0 - 100 mmol/L   Acid-Base Excess 5.4 (H) 0.0 - 2.0 mmol/L   O2 Saturation 90.6 %   Collection site LEFT RADIAL    Drawn by [id number]    Sample type ARTERIAL DRAW    Allens test (pass/fail) PASS PASS  Glucose, capillary     Status: Abnormal   Collection Time: 04/19/15  4:13 AM  Result Value Ref Range   Glucose-Capillary 157 (H) 65 - 99 mg/dL  Glucose, capillary     Status: Abnormal   Collection Time: 04/19/15  7:54 AM  Result Value Ref Range   Glucose-Capillary 112 (H) 65 - 99 mg/dL  Glucose, capillary     Status: Abnormal   Collection Time: 04/19/15 11:27 AM  Result Value Ref Range   Glucose-Capillary 153 (H) 65 - 99 mg/dL  Gram stain     Status: None   Collection  Time: 04/19/15 12:22 PM  Result Value Ref Range   Specimen Description PLEURAL    Special Requests NONE    Gram Stain      CYTOSPIN SMEAR WBC PRESENT, PREDOMINANTLY MONONUCLEAR NO ORGANISMS SEEN Performed at [facility]    Report Status 04/20/2015 FINAL   Glucose, pleural or peritoneal fluid     Status: None   Collection Time: 04/19/15  2:00 PM  Result Value Ref Range   Glucose, Fluid 160 mg/dL    Comment: (NOTE) No normal range established for this test Results should be evaluated in conjunction with serum values    Fluid Type-FGLU PERITONEAL CAVITY   Protein, fluid - pleural or peritoneal     Status: None   Collection Time: 04/19/15  2:00 PM  Result Value Ref Range   Total protein, fluid 4.2 g/dL    Comment: (NOTE) No normal range established for this test Results should be evaluated in  conjunction with serum values    Fluid Type-FTP PERITONEAL CAVITY   Body fluid cell count with differential     Status: Abnormal   Collection Time: 04/19/15  2:00 PM  Result Value Ref Range   Fluid Type-FCT ASCITIES    Color, Fluid YELLOW (A) YELLOW   Appearance, Fluid HAZY (A) CLEAR   WBC, Fluid 627 0 - 1000 cu mm   Neutrophil Count, Fluid 8 0 - 25 %   Lymphs, Fluid 85 %   Monocyte-Macrophage-Serous Fluid 7 (L) 50 - 90 %   Eos, Fluid 0 %   Other Cells, Fluid OTHER CELLS IDENTIFIED AS MESOTHELIAL CELLS %  Culture, body fluid-bottle     Status: None (Preliminary result)   Collection Time: 04/19/15  2:30 PM  Result Value Ref Range   Specimen Description PLEURAL    Special Requests NONE    Culture NO GROWTH < 24 HOURS    Report Status PENDING   Glucose, capillary     Status: Abnormal   Collection Time: 04/19/15  3:53 PM  Result Value Ref Range   Glucose-Capillary 166 (H) 65 - 99 mg/dL   Comment 1 Notify RN   Glucose, capillary     Status: Abnormal   Collection Time: 04/19/15  7:50 PM  Result Value Ref Range   Glucose-Capillary 114 (H) 65 - 99 mg/dL  Glucose, capillary     Status: Abnormal   Collection Time: 04/19/15 11:08 PM  Result Value Ref Range   Glucose-Capillary 177 (H) 65 - 99 mg/dL  Blood gas, arterial     Status: Abnormal   Collection Time: 04/20/15  5:00 AM  Result Value Ref Range   FIO2 0.65    Delivery systems VENTILATOR    Mode PRESSURE REGULATED VOLUME CONTROL    VT 540 mL   LHR 14 resp/min   Peep/cpap 5.0 cm H20   pH, Arterial 7.461 (H) 7.350 - 7.450   pCO2 arterial 42.8 35.0 - 45.0 mmHg   pO2, Arterial 70.6 (L) 80.0 - 100.0 mmHg   Bicarbonate 29.7 (H) 20.0 - 24.0 mEq/L   Acid-Base Excess 6.1 (H) 0.0 - 2.0 mmol/L   O2 Saturation 92.6 %   Patient temperature 37.0    Collection site LEFT RADIAL    Drawn by [id number]    Sample type ARTERIAL DRAW    Allens test (pass/fail) PASS PASS  Glucose, capillary     Status: Abnormal   Collection Time: 04/20/15  5:03  AM  Result Value Ref Range   Glucose-Capillary 127 (H) 65 -  99 mg/dL  Glucose, capillary     Status: Abnormal   Collection Time: 04/20/15  7:33 AM  Result Value Ref Range   Glucose-Capillary 162 (H) 65 - 99 mg/dL   Comment 1 Notify RN    Comment 2 Document in Chart     ABGS  Recent Labs  04/19/15 0340 04/20/15 0500  PHART 7.471* 7.461*  PO2ART 64.0* 70.6*  TCO2 15.6  --   HCO3 29.1* 29.7*   CULTURES Recent Results (from the past 240 hour(s))  MRSA PCR Screening     Status: None   Collection Time: 04/11/15 11:42 AM  Result Value Ref Range Status   MRSA by PCR NEGATIVE NEGATIVE Final    Comment:        The GeneXpert MRSA Assay (FDA approved for NASAL specimens only), is one component of a comprehensive MRSA colonization surveillance program. It is not intended to diagnose MRSA infection nor to guide or monitor treatment for MRSA infections.   Culture, blood (Routine X 2) w Reflex to ID Panel     Status: None   Collection Time: 04/13/15 10:02 AM  Result Value Ref Range Status   Specimen Description BLOOD LEFT ARM  Final   Special Requests BOTTLES DRAWN AEROBIC AND ANAEROBIC 6CC EACH  Final   Culture NO GROWTH 5 DAYS  Final   Report Status 04/18/2015 FINAL  Final  Culture, blood (Routine X 2) w Reflex to ID Panel     Status: None   Collection Time: 04/13/15 10:08 AM  Result Value Ref Range Status   Specimen Description BLOOD LEFT HAND  Final   Special Requests BOTTLES DRAWN AEROBIC AND ANAEROBIC 5CC EACH  Final   Culture NO GROWTH 5 DAYS  Final   Report Status 04/18/2015 FINAL  Final  Culture, respiratory (NON-Expectorated)     Status: None   Collection Time: 04/14/15  9:32 PM  Result Value Ref Range Status   Specimen Description TRACHEAL ASPIRATE  Final   Special Requests NONE  Final   Gram Stain   Final    MODERATE WBC PRESENT, PREDOMINANTLY PMN NO SQUAMOUS EPITHELIAL CELLS SEEN MODERATE GRAM POSITIVE RODS Performed at [facility]    Culture    Final    NORMAL OROPHARYNGEAL FLORA Performed at [facility]    Report Status 04/17/2015 FINAL  Final  Culture, expectorated sputum-assessment     Status: None   Collection Time: 04/14/15  9:48 PM  Result Value Ref Range Status   Specimen Description ENDOTRACHEAL  Final   Special Requests NONE  Final   Sputum evaluation THIS SPECIMEN IS ACCEPTABLE FOR SPUTUM CULTURE  Final   Report Status 04/14/2015 FINAL  Final  Gram stain     Status: None   Collection Time: 04/19/15 12:22 PM  Result Value Ref Range Status   Specimen Description PLEURAL  Final   Special Requests NONE  Final   Gram Stain   Final    CYTOSPIN SMEAR WBC PRESENT, PREDOMINANTLY MONONUCLEAR NO ORGANISMS SEEN Performed at [facility]    Report Status 04/20/2015 FINAL  Final  Culture, body fluid-bottle     Status: None (Preliminary result)   Collection Time: 04/19/15  2:30 PM  Result Value Ref Range Status   Specimen Description PLEURAL  Final   Special Requests NONE  Final   Culture NO GROWTH < 24 HOURS  Final   Report Status PENDING  Incomplete   Studies/Results: Ct Chest Wo Contrast  04/18/2015  CLINICAL DATA:  Bilateral pleural effusions. Hypoxic respiratory failure. Pneumonia. EXAM: CT CHEST WITHOUT CONTRAST TECHNIQUE: Multidetector CT imaging of the chest was performed following the standard protocol without IV contrast. COMPARISON:  04/12/2015 FINDINGS: Endotracheal tube is in adequate position. Right-sided PICC line as tip within the SVC. Enteric tube with tip over the mid stomach. Lungs are adequately inflated and demonstrate interval improvement of a moderate size right pleural effusion with associated compressive atelectasis within the right base. Slight worsening consolidation of the posterior medial left base likely atelectasis with tiny amount of associated pleural fluid. Subtle hazy attenuation in the perihilar regions suggesting minimal interstitial edema. Tiny calcified granuloma over the  lateral left upper lobe. Airways are within normal. There is mild stable cardiomegaly. There is calcified plaque over the coronary arteries and thoracic aorta. Median sternotomy wires are present. There has been a slight decrease in size of the precarinal lymph node which now measures 1 cm likely reactive. No evidence of hilar or axillary adenopathy. Remaining mediastinal structures are within normal. Images through the upper abdomen are unchanged. Mild degenerate change of the spine. IMPRESSION: Mild interval decrease in size of a moderate size right pleural effusion with associated compressive atelectasis in the right base. Slight worsening consolidation over the posterior medial left base likely atelectasis with tiny amount of pleural fluid. Cardiomegaly with findings suggesting subtle interstitial edema. Electronically Signed   By: Elberta Fortis M.D.   On: 04/18/2015 13:50   Dg Chest Port 1 View  04/19/2015  CLINICAL DATA:  RIGHT pleural effusion post thoracentesis EXAM: PORTABLE CHEST 1 VIEW COMPARISON:  Portable exam 1412 hours compared to 0602 hours FINDINGS: Decrease in RIGHT pleural effusion and RIGHT basilar atelectasis versus earlier study. No definite pneumothorax. Stable endotracheal tube, nasogastric tube and RIGHT arm PICC line. Enlargement of cardiac silhouette post CABG. Atelectasis and effusion at LEFT base remain. Atherosclerotic calcification aorta. IMPRESSION: No pneumothorax following RIGHT thoracentesis. Electronically Signed   By: Ulyses Southward M.D.   On: 04/19/2015 14:37   Dg Chest Port 1 View  04/19/2015  CLINICAL DATA:  Ventilator.  Respiratory failure EXAM: PORTABLE CHEST 1 VIEW COMPARISON:  04/17/2015 FINDINGS: Prior CABG. There is cardiomegaly. Bilateral lower lobe opacities with layering effusions. Some improvement in aeration at the right lung base since prior study. Otherwise no change. IMPRESSION: Some improvement in aeration at the right lung base. Otherwise no change.  Electronically Signed   By: Charlett Nose M.D.   On: 04/19/2015 08:11   US Thoracentesis Asp Pleural Space W/img Guide  04/19/2015  CLINICAL DATA:  RIGHT pleural effusion, RIGHT lower lobe atelectasis, high oxygen requirements, on ventilator EXAM: ULTRASOUND GUIDED DIAGNOSTIC AND THERAPEUTIC RIGHT THORACENTESIS COMPARISON:  Chest CT 04/17/2014 PROCEDURE: Procedure, benefits, and risks of procedure were discussed with patient's wife. Written informed consent for procedure was obtained. Time out protocol followed. Procedure performed portably in ICU. Patient placed in an LPO projection. Pleural effusion localized by ultrasound at the lateral RIGHT hemithorax. Skin prepped and draped in usual sterile fashion. Skin and soft tissues anesthetized with 10 mL of 1% lidocaine. Under direct sonographic visualization, 5 [place] Yueh catheter placed into the RIGHT pleural space. 270 mL of yellow RIGHT pleural fluid was aspirated by syringe. Due to limited positioning of patient for access of fluid, was unable to aspirate additional fluid. Procedure tolerated well by patient without immediate complication. COMPLICATIONS: None. FINDINGS: A total of approximately 270 mL of RIGHT pleural fluid was removed. A fluid sample of 180 mL was sent for laboratory analysis.  IMPRESSION: Successful ultrasound guided RIGHT thoracentesis yielding 270 mL of pleural fluid. Electronically Signed   By: Ulyses SouthwardMark  Boles M.D.   On: 04/19/2015 14:42    Medications:  Prior to Admission:  Prescriptions prior to admission  Medication Sig Dispense Refill Last Dose  . amLODipine (NORVASC) 10 MG tablet Take 10 mg by mouth daily.   04/11/2015 at Unknown time  . atorvastatin (LIPITOR) 20 MG tablet Take 20 mg by mouth daily.   04/11/2015 at Unknown time  . clonazePAM (KLONOPIN) 1 MG tablet Take 1 mg by mouth 2 (two) times daily.   04/11/2015 at Unknown time  . clopidogrel (PLAVIX) 75 MG tablet Take 75 mg by mouth daily.   04/11/2015 at Unknown time  .  esomeprazole (NEXIUM) 40 MG capsule Take 40 mg by mouth daily before breakfast.   04/11/2015 at Unknown time  . furosemide (LASIX) 40 MG tablet Take 0.5-1 tablets (20-40 mg total) by mouth every other day. Alternated a half pill with whole pill every other day (Patient taking differently: Take 40 mg by mouth daily. ) 115 tablet 3 unknown  . glimepiride (AMARYL) 2 MG tablet Take 2 mg by mouth daily.    04/11/2015 at Unknown time  . isosorbide mononitrate (IMDUR) 30 MG 24 hr tablet Take 30 mg by mouth daily.   04/11/2015 at Unknown time  . lisinopril (PRINIVIL,ZESTRIL) 2.5 MG tablet Take 2.5 mg by mouth daily.   04/11/2015 at Unknown time  . metFORMIN (GLUCOPHAGE) 500 MG tablet Take 1,000 mg by mouth 2 (two) times daily with a meal.   04/11/2015 at Unknown time  . metoprolol (LOPRESSOR) 100 MG tablet Take 100 mg by mouth every morning. & 1/2 by mouth in evening   04/11/2015 at Unknown time  . nitroGLYCERIN (NITROSTAT) 0.4 MG SL tablet Place 0.4 mg under the tongue every 5 (five) minutes as needed.   unknown  . oxyCODONE-acetaminophen (PERCOCET) 7.5-325 MG tablet Take 1 tablet by mouth every 4 (four) hours as needed for severe pain (4-6 hours).   unknown  . tiZANidine (ZANAFLEX) 4 MG tablet Take 4 mg by mouth 2 (two) times daily.    04/11/2015 at Unknown time  . traZODone (DESYREL) 100 MG tablet Take 100 mg by mouth at bedtime.   04/10/2015 at Unknown time  . gabapentin (NEURONTIN) 300 MG capsule Take 300 mg by mouth 3 (three) times daily.   Taking   Scheduled: . antiseptic oral rinse  7 mL Mouth Rinse QID  . azithromycin  500 mg Intravenous Q24H  . bumetanide (BUMEX) IV  2 mg Intravenous Q12H  . cefTRIAXone (ROCEPHIN)  IV  1 g Intravenous Q24H  . chlorhexidine gluconate  15 mL Mouth Rinse BID  . feeding supplement (PRO-STAT SUGAR FREE 64)  60 mL Per Tube TID  . feeding supplement (VITAL HIGH PROTEIN)  1,000 mL Per OG tube Q24H  . insulin aspart  0-15 Units Subcutaneous 6 times per day  .  ipratropium-albuterol  3 mL Nebulization Q6H  . methylPREDNISolone (SOLU-MEDROL) injection  40 mg Intravenous Q12H  . pantoprazole (PROTONIX) IV  40 mg Intravenous Daily  . sodium chloride  3 mL Intravenous Q12H   Continuous: . propofol (DIPRIVAN) infusion 50 mcg/kg/min (04/20/15 0905)   UJW:[id number]:fentaNYL (SUBLIMAZE) injection, fentaNYL (SUBLIMAZE) injection, nitroGLYCERIN, ondansetron **OR** ondansetron (ZOFRAN) IV  Assesment: He was admitted with respiratory failure and culminated in having intubation and mechanical ventilation after he failed BiPAP. He has community-acquired pneumonia which is being treated appropriately. He has a pleural  effusion that appears to be parapneumonic not empyema. He has CHF and is now down 7.5 L. He is slowly improving but still requiring high flow oxygen. Principal Problem:   Ventilator dependence (HCC) Active Problems:   Diabetes (HCC)   TOBACCO USER   Essential hypertension   CORONARY ATHEROSCLEROSIS NATIVE CORONARY ARTERY   Pleural effusion, right   Acute respiratory failure with hypercapnia (HCC)   COPD with acute exacerbation (HCC)   Diastolic CHF, acute on chronic (HCC)   CAP (community acquired pneumonia)   Acute on chronic diastolic CHF (congestive heart failure), NYHA class 1 (HCC)    Plan: Continue current treatments. If he doesn't seem to be in a position that we think he can come off the ventilator by midweek he needs tracheostomy. Continue other treatments.    LOS: 9 days   Marqual Mi L 04/20/2015, 9:29 AM

## 2015-04-20 NOTE — Progress Notes (Addendum)
I met with patient's wife [name] and his son, and updated his condition. He is seriously ill, though he has been improving slowly.  He is still intubated and requires 65% FIO2.  He has CHF as has been diuresing.   We discussed potential need for tracheostomy if he cannot be weaned off the ventilator.  Family at this time would like to maintain full support in term of treatment, but in the case of cardiac arrest, their wishes is that he be DNR.  We will honor this wish.  DNR ordered entered.   [name],  MD  FACP.

## 2015-04-21 ENCOUNTER — Inpatient Hospital Stay (HOSPITAL_COMMUNITY): Payer: Medicare HMO

## 2015-04-21 LAB — CBC WITH DIFFERENTIAL/PLATELET
BASOS PCT: 0 %
Basophils Absolute: 0 10*3/uL (ref 0.0–0.1)
EOS ABS: 0 10*3/uL (ref 0.0–0.7)
Eosinophils Relative: 0 %
HCT: 45.9 % (ref 39.0–52.0)
Hemoglobin: 13.1 g/dL (ref 13.0–17.0)
LYMPHS ABS: 1.2 10*3/uL (ref 0.7–4.0)
LYMPHS PCT: 12 %
MCH: 25.7 pg — AB (ref 26.0–34.0)
MCHC: 28.5 g/dL — ABNORMAL LOW (ref 30.0–36.0)
MCV: 90 fL (ref 78.0–100.0)
MONO ABS: 0.6 10*3/uL (ref 0.1–1.0)
Monocytes Relative: 6 %
NEUTROS ABS: 8 10*3/uL — AB (ref 1.7–7.7)
Neutrophils Relative %: 82 %
PLATELETS: 171 10*3/uL (ref 150–400)
RBC: 5.1 MIL/uL (ref 4.22–5.81)
RDW: 18.5 % — ABNORMAL HIGH (ref 11.5–15.5)
WBC: 9.8 10*3/uL (ref 4.0–10.5)

## 2015-04-21 LAB — GLUCOSE, CAPILLARY
GLUCOSE-CAPILLARY: 118 mg/dL — AB (ref 65–99)
GLUCOSE-CAPILLARY: 110 mg/dL — AB (ref 65–99)
Glucose-Capillary: 147 mg/dL — ABNORMAL HIGH (ref 65–99)
Glucose-Capillary: 219 mg/dL — ABNORMAL HIGH (ref 65–99)
Glucose-Capillary: 147 mg/dL — ABNORMAL HIGH (ref 65–99)
Glucose-Capillary: 246 mg/dL — ABNORMAL HIGH (ref 65–99)

## 2015-04-21 LAB — BLOOD GAS, ARTERIAL
ACID-BASE EXCESS: 4.9 mmol/L — AB (ref 0.0–2.0)
Bicarbonate: 28.2 mEq/L — ABNORMAL HIGH (ref 20.0–24.0)
DRAWN BY: 22223
FIO2: 65
MECHVT: 540 mL
O2 Saturation: 91.5 %
PEEP/CPAP: 5 cmH2O
PO2 ART: 67.7 mmHg — AB (ref 80.0–100.0)
Patient temperature: 37
RATE: 14 resp/min
TCO2: 17.2 mmol/L (ref 0–100)
pCO2 arterial: 46.9 mmHg — ABNORMAL HIGH (ref 35.0–45.0)
pH, Arterial: 7.412 (ref 7.350–7.450)

## 2015-04-21 LAB — COMPREHENSIVE METABOLIC PANEL
ALT: 14 U/L — ABNORMAL LOW (ref 17–63)
ANION GAP: 8 (ref 5–15)
AST: 21 U/L (ref 15–41)
Albumin: 3.3 g/dL — ABNORMAL LOW (ref 3.5–5.0)
Alkaline Phosphatase: 50 U/L (ref 38–126)
BILIRUBIN TOTAL: 0.8 mg/dL (ref 0.3–1.2)
BUN: 104 mg/dL — AB (ref 6–20)
CALCIUM: 9.5 mg/dL (ref 8.9–10.3)
CHLORIDE: 107 mmol/L (ref 101–111)
CO2: 32 mmol/L (ref 22–32)
CREATININE: 1.26 mg/dL — AB (ref 0.61–1.24)
GFR, EST NON AFRICAN AMERICAN: 55 mL/min — AB (ref >60)
Glucose, Bld: 146 mg/dL — ABNORMAL HIGH (ref 65–99)
POTASSIUM: 2.8 mmol/L — AB (ref 3.5–5.1)
Sodium: 147 mmol/L — ABNORMAL HIGH (ref 135–145)
Total Protein: 6.8 g/dL (ref 6.5–8.1)

## 2015-04-21 LAB — TRIGLYCERIDES: TRIGLYCERIDES: 130 mg/dL (ref <150)

## 2015-04-21 MED ORDER — POTASSIUM CHLORIDE 10 MEQ/100ML IV SOLN
10.0000 meq | INTRAVENOUS | Status: AC
  Administered 2015-04-21 (×4): 10 meq via INTRAVENOUS
  Filled 2015-04-21: qty 100

## 2015-04-21 NOTE — Progress Notes (Signed)
Subjective: He remains intubated and sedated. The note from Dr. Marin Comment is noted and appreciated.  Objective: Vital signs in last 24 hours: Temp:  [97.7 F (36.5 C)-99 F (37.2 C)] 97.7 F (36.5 C) (12/25 0801) Pulse Rate:  [60-86] 74 (12/25 0801) Resp:  [13-19] 14 (12/25 0801) BP: (85-137)/(55-85) 119/72 mmHg (12/25 0801) SpO2:  [91 %-97 %] 96 % (12/25 0801) FiO2 (%):  [65 %] 65 % (12/25 0801) Weight:  [75.9 kg (167 lb 5.3 oz)] 75.9 kg (167 lb 5.3 oz) (12/25 0500) Weight change: -0.6 kg (-1 lb 5.2 oz) Last BM Date: 04/19/15  Intake/Output from previous day: 12/24 0701 - 12/25 0700 In: 819 [I.V.:282.7; NG/GT:236.3; IV Piggyback:300] Out: 2000 [Urine:2000]  PHYSICAL EXAM General appearance: Intubated sedated on mechanical ventilation Resp: rhonchi bilaterally Cardio: regular rate and rhythm, S1, S2 normal, no murmur, click, rub or gallop GI: soft, non-tender; bowel sounds normal; no masses,  no organomegaly Extremities: extremities normal, atraumatic, no cyanosis or edema  Lab Results:  Results for orders placed or performed during the hospital encounter of 04/11/15 (from the past 48 hour(s))  Glucose, capillary     Status: Abnormal   Collection Time: 04/19/15 11:27 AM  Result Value Ref Range   Glucose-Capillary 153 (H) 65 - 99 mg/dL  Gram stain     Status: None   Collection Time: 04/19/15 12:22 PM  Result Value Ref Range   Specimen Description PLEURAL    Special Requests NONE    Gram Stain      CYTOSPIN SMEAR WBC PRESENT, PREDOMINANTLY MONONUCLEAR NO ORGANISMS SEEN Performed at [facility]    Report Status 04/20/2015 FINAL   Glucose, pleural or peritoneal fluid     Status: None   Collection Time: 04/19/15  2:00 PM  Result Value Ref Range   Glucose, Fluid 160 mg/dL    Comment: (NOTE) No normal range established for this test Results should be evaluated in conjunction with serum values    Fluid Type-FGLU PERITONEAL CAVITY   Protein, fluid - pleural or  peritoneal     Status: None   Collection Time: 04/19/15  2:00 PM  Result Value Ref Range   Total protein, fluid 4.2 g/dL    Comment: (NOTE) No normal range established for this test Results should be evaluated in conjunction with serum values    Fluid Type-FTP PERITONEAL CAVITY   PH, Body Fluid     Status: None   Collection Time: 04/19/15  2:00 PM  Result Value Ref Range   pH, Body Fluid 8.4 Not Estab.    Comment: (NOTE) This test was developed and its performance characteristics determined by [facility]. It has not been cleared or approved by the Food and Drug Administration. Performed At: [facility] [place], [place] [id number] Lindon Romp MD YC:[id number]    Source of Sample PLEURAL   Body fluid cell count with differential     Status: Abnormal   Collection Time: 04/19/15  2:00 PM  Result Value Ref Range   Fluid Type-FCT ASCITIES    Color, Fluid YELLOW (A) YELLOW   Appearance, Fluid HAZY (A) CLEAR   WBC, Fluid 627 0 - 1000 cu mm   Neutrophil Count, Fluid 8 0 - 25 %   Lymphs, Fluid 85 %   Monocyte-Macrophage-Serous Fluid 7 (L) 50 - 90 %   Eos, Fluid 0 %   Other Cells, Fluid OTHER CELLS IDENTIFIED AS MESOTHELIAL CELLS %  Culture, body fluid-bottle     Status: None (  Preliminary result)   Collection Time: 04/19/15  2:30 PM  Result Value Ref Range   Specimen Description PLEURAL    Special Requests NONE    Culture NO GROWTH 2 DAYS    Report Status PENDING   Glucose, capillary     Status: Abnormal   Collection Time: 04/19/15  3:53 PM  Result Value Ref Range   Glucose-Capillary 166 (H) 65 - 99 mg/dL   Comment 1 Notify RN   Glucose, capillary     Status: Abnormal   Collection Time: 04/19/15  7:50 PM  Result Value Ref Range   Glucose-Capillary 114 (H) 65 - 99 mg/dL  Glucose, capillary     Status: Abnormal   Collection Time: 04/19/15 11:08 PM  Result Value Ref Range   Glucose-Capillary 177 (H) 65 - 99 mg/dL  Blood gas, arterial     Status:  Abnormal   Collection Time: 04/20/15  5:00 AM  Result Value Ref Range   FIO2 0.65    Delivery systems VENTILATOR    Mode PRESSURE REGULATED VOLUME CONTROL    VT 540 mL   LHR 14 resp/min   Peep/cpap 5.0 cm H20   pH, Arterial 7.461 (H) 7.350 - 7.450   pCO2 arterial 42.8 35.0 - 45.0 mmHg   pO2, Arterial 70.6 (L) 80.0 - 100.0 mmHg   Bicarbonate 29.7 (H) 20.0 - 24.0 mEq/L   Acid-Base Excess 6.1 (H) 0.0 - 2.0 mmol/L   O2 Saturation 92.6 %   Patient temperature 37.0    Collection site LEFT RADIAL    Drawn by [id number]    Sample type ARTERIAL DRAW    Allens test (pass/fail) PASS PASS  Glucose, capillary     Status: Abnormal   Collection Time: 04/20/15  5:03 AM  Result Value Ref Range   Glucose-Capillary 127 (H) 65 - 99 mg/dL  Glucose, capillary     Status: Abnormal   Collection Time: 04/20/15  7:33 AM  Result Value Ref Range   Glucose-Capillary 162 (H) 65 - 99 mg/dL   Comment 1 Notify RN    Comment 2 Document in Chart   Glucose, capillary     Status: Abnormal   Collection Time: 04/20/15 11:38 AM  Result Value Ref Range   Glucose-Capillary 142 (H) 65 - 99 mg/dL   Comment 1 Notify RN    Comment 2 Document in Chart   Glucose, capillary     Status: Abnormal   Collection Time: 04/20/15  4:22 PM  Result Value Ref Range   Glucose-Capillary 146 (H) 65 - 99 mg/dL   Comment 1 Notify RN    Comment 2 Document in Chart   Glucose, capillary     Status: Abnormal   Collection Time: 04/20/15  7:47 PM  Result Value Ref Range   Glucose-Capillary 129 (H) 65 - 99 mg/dL   Comment 1 Notify RN   Glucose, capillary     Status: Abnormal   Collection Time: 04/20/15 11:51 PM  Result Value Ref Range   Glucose-Capillary 167 (H) 65 - 99 mg/dL   Comment 1 Notify RN   Glucose, capillary     Status: Abnormal   Collection Time: 04/21/15  4:18 AM  Result Value Ref Range   Glucose-Capillary 147 (H) 65 - 99 mg/dL  Blood gas, arterial     Status: Abnormal   Collection Time: 04/21/15  5:00 AM  Result Value  Ref Range   FIO2 65.00    Delivery systems VENTILATOR    Mode PRESSURE REGULATED VOLUME  CONTROL    VT 540 mL   LHR 14 resp/min   Peep/cpap 5.0 cm H20   pH, Arterial 7.412 7.350 - 7.450   pCO2 arterial 46.9 (H) 35.0 - 45.0 mmHg   pO2, Arterial 67.7 (L) 80.0 - 100.0 mmHg   Bicarbonate 28.2 (H) 20.0 - 24.0 mEq/L   TCO2 17.2 0 - 100 mmol/L   Acid-Base Excess 4.9 (H) 0.0 - 2.0 mmol/L   O2 Saturation 91.5 %   Patient temperature 37.0    Collection site RIGHT RADIAL    Drawn by [id number]    Sample type ARTERIAL    Allens test (pass/fail) PASS PASS  Glucose, capillary     Status: Abnormal   Collection Time: 04/21/15  7:40 AM  Result Value Ref Range   Glucose-Capillary 118 (H) 65 - 99 mg/dL   Comment 1 Notify RN    Comment 2 Document in Chart   Triglycerides     Status: None   Collection Time: 04/21/15  8:42 AM  Result Value Ref Range   Triglycerides 130 <150 mg/dL  Comprehensive metabolic panel     Status: Abnormal   Collection Time: 04/21/15  8:42 AM  Result Value Ref Range   Sodium 147 (H) 135 - 145 mmol/L   Potassium 2.8 (L) 3.5 - 5.1 mmol/L   Chloride 107 101 - 111 mmol/L   CO2 32 22 - 32 mmol/L   Glucose, Bld 146 (H) 65 - 99 mg/dL   BUN 104 (H) 6 - 20 mg/dL    Comment: RESULTS CONFIRMED BY MANUAL DILUTION   Creatinine, Ser 1.26 (H) 0.61 - 1.24 mg/dL   Calcium 9.5 8.9 - 10.3 mg/dL   Total Protein 6.8 6.5 - 8.1 g/dL   Albumin 3.3 (L) 3.5 - 5.0 g/dL   AST 21 15 - 41 U/L   ALT 14 (L) 17 - 63 U/L   Alkaline Phosphatase 50 38 - 126 U/L   Total Bilirubin 0.8 0.3 - 1.2 mg/dL   GFR calc non Af Amer 55 (L) >60 mL/min   GFR calc Af Amer >60 >60 mL/min    Comment: (NOTE) The eGFR has been calculated using the CKD EPI equation. This calculation has not been validated in all clinical situations. eGFR's persistently <60 mL/min signify possible Chronic Kidney Disease.    Anion gap 8 5 - 15  CBC with Differential/Platelet     Status: Abnormal (Preliminary result)   Collection Time:  04/21/15  8:42 AM  Result Value Ref Range   WBC PENDING 4.0 - 10.5 K/uL   RBC 5.10 4.22 - 5.81 MIL/uL   Hemoglobin 13.1 13.0 - 17.0 g/dL   HCT 45.9 39.0 - 52.0 %   MCV 90.0 78.0 - 100.0 fL   MCH 25.7 (L) 26.0 - 34.0 pg   MCHC 28.5 (L) 30.0 - 36.0 g/dL   RDW 18.5 (H) 11.5 - 15.5 %   Platelets PENDING 150 - 400 K/uL   Neutrophils Relative % PENDING %   Neutro Abs PENDING 1.7 - 7.7 K/uL   Band Neutrophils PENDING %   Lymphocytes Relative PENDING %   Lymphs Abs PENDING 0.7 - 4.0 K/uL   Monocytes Relative PENDING %   Monocytes Absolute PENDING 0.1 - 1.0 K/uL   Eosinophils Relative PENDING %   Eosinophils Absolute PENDING 0.0 - 0.7 K/uL   Basophils Relative PENDING %   Basophils Absolute PENDING 0.0 - 0.1 K/uL   WBC Morphology PENDING    RBC Morphology PENDING  Smear Review PENDING    nRBC PENDING 0 /100 WBC   Metamyelocytes Relative PENDING %   Myelocytes PENDING %   Promyelocytes Absolute PENDING %   Blasts PENDING %    ABGS  Recent Labs  04/21/15 0500  PHART 7.412  PO2ART 67.7*  TCO2 17.2  HCO3 28.2*   CULTURES Recent Results (from the past 240 hour(s))  MRSA PCR Screening     Status: None   Collection Time: 04/11/15 11:42 AM  Result Value Ref Range Status   MRSA by PCR NEGATIVE NEGATIVE Final    Comment:        The GeneXpert MRSA Assay (FDA approved for NASAL specimens only), is one component of a comprehensive MRSA colonization surveillance program. It is not intended to diagnose MRSA infection nor to guide or monitor treatment for MRSA infections.   Culture, blood (Routine X 2) w Reflex to ID Panel     Status: None   Collection Time: 04/13/15 10:02 AM  Result Value Ref Range Status   Specimen Description BLOOD LEFT ARM  Final   Special Requests BOTTLES DRAWN AEROBIC AND ANAEROBIC [place]  Final   Culture NO GROWTH 5 DAYS  Final   Report Status 04/18/2015 FINAL  Final  Culture, blood (Routine X 2) w Reflex to ID Panel     Status: None   Collection  Time: 04/13/15 10:08 AM  Result Value Ref Range Status   Specimen Description BLOOD LEFT HAND  Final   Special Requests BOTTLES DRAWN AEROBIC AND ANAEROBIC 5CC EACH  Final   Culture NO GROWTH 5 DAYS  Final   Report Status 04/18/2015 FINAL  Final  Culture, respiratory (NON-Expectorated)     Status: None   Collection Time: 04/14/15  9:32 PM  Result Value Ref Range Status   Specimen Description TRACHEAL ASPIRATE  Final   Special Requests NONE  Final   Gram Stain   Final    MODERATE WBC PRESENT, PREDOMINANTLY PMN NO SQUAMOUS EPITHELIAL CELLS SEEN MODERATE GRAM POSITIVE RODS Performed at [facility]    Culture   Final    NORMAL OROPHARYNGEAL FLORA Performed at [facility]    Report Status 04/17/2015 FINAL  Final  Culture, expectorated sputum-assessment     Status: None   Collection Time: 04/14/15  9:48 PM  Result Value Ref Range Status   Specimen Description ENDOTRACHEAL  Final   Special Requests NONE  Final   Sputum evaluation THIS SPECIMEN IS ACCEPTABLE FOR SPUTUM CULTURE  Final   Report Status 04/14/2015 FINAL  Final  Gram stain     Status: None   Collection Time: 04/19/15 12:22 PM  Result Value Ref Range Status   Specimen Description PLEURAL  Final   Special Requests NONE  Final   Gram Stain   Final    CYTOSPIN SMEAR WBC PRESENT, PREDOMINANTLY MONONUCLEAR NO ORGANISMS SEEN Performed at [facility]    Report Status 04/20/2015 FINAL  Final  Culture, body fluid-bottle     Status: None (Preliminary result)   Collection Time: 04/19/15  2:30 PM  Result Value Ref Range Status   Specimen Description PLEURAL  Final   Special Requests NONE  Final   Culture NO GROWTH 2 DAYS  Final   Report Status PENDING  Incomplete   Studies/Results: Dg Chest Port 1 View  04/21/2015  CLINICAL DATA:  Respiratory failure EXAM: PORTABLE CHEST 1 VIEW COMPARISON:  04/19/2015 FINDINGS: The ET tube tip is above the carina. There is a a  right arm PICC line with tip in the  cavoatrial junction. Heart size is normal. There is a moderate left pleural effusion identified no right pleural effusion. Diminished aeration to the left lung base noted. IMPRESSION: 1. Decreased aeration to the left lung base with associated left pleural effusion. 2. Stable support apparatus. Electronically Signed   By: Kerby Moors M.D.   On: 04/21/2015 09:04   Dg Chest Port 1 View  04/19/2015  CLINICAL DATA:  RIGHT pleural effusion post thoracentesis EXAM: PORTABLE CHEST 1 VIEW COMPARISON:  Portable exam 1412 hours compared to 0602 hours FINDINGS: Decrease in RIGHT pleural effusion and RIGHT basilar atelectasis versus earlier study. No definite pneumothorax. Stable endotracheal tube, nasogastric tube and RIGHT arm PICC line. Enlargement of cardiac silhouette post CABG. Atelectasis and effusion at LEFT base remain. Atherosclerotic calcification aorta. IMPRESSION: No pneumothorax following RIGHT thoracentesis. Electronically Signed   By: Lavonia Dana M.D.   On: 04/19/2015 14:37   US Thoracentesis Asp Pleural Space W/img Guide  04/19/2015  CLINICAL DATA:  RIGHT pleural effusion, RIGHT lower lobe atelectasis, high oxygen requirements, on ventilator EXAM: ULTRASOUND GUIDED DIAGNOSTIC AND THERAPEUTIC RIGHT THORACENTESIS COMPARISON:  Chest CT 04/17/2014 PROCEDURE: Procedure, benefits, and risks of procedure were discussed with patient's wife. Written informed consent for procedure was obtained. Time out protocol followed. Procedure performed portably in ICU. Patient placed in an LPO projection. Pleural effusion localized by ultrasound at the lateral RIGHT hemithorax. Skin prepped and draped in usual sterile fashion. Skin and soft tissues anesthetized with 10 mL of 1% lidocaine. Under direct sonographic visualization, 5 [place] Yueh catheter placed into the RIGHT pleural space. 270 mL of yellow RIGHT pleural fluid was aspirated by syringe. Due to limited positioning of patient for access of fluid, was unable to  aspirate additional fluid. Procedure tolerated well by patient without immediate complication. COMPLICATIONS: None. FINDINGS: A total of approximately 270 mL of RIGHT pleural fluid was removed. A fluid sample of 180 mL was sent for laboratory analysis. IMPRESSION: Successful ultrasound guided RIGHT thoracentesis yielding 270 mL of pleural fluid. Electronically Signed   By: Lavonia Dana M.D.   On: 04/19/2015 14:42    Medications:  Prior to Admission:  Prescriptions prior to admission  Medication Sig Dispense Refill Last Dose  . amLODipine (NORVASC) 10 MG tablet Take 10 mg by mouth daily.   04/11/2015 at Unknown time  . atorvastatin (LIPITOR) 20 MG tablet Take 20 mg by mouth daily.   04/11/2015 at Unknown time  . clonazePAM (KLONOPIN) 1 MG tablet Take 1 mg by mouth 2 (two) times daily.   04/11/2015 at Unknown time  . clopidogrel (PLAVIX) 75 MG tablet Take 75 mg by mouth daily.   04/11/2015 at Unknown time  . esomeprazole (NEXIUM) 40 MG capsule Take 40 mg by mouth daily before breakfast.   04/11/2015 at Unknown time  . furosemide (LASIX) 40 MG tablet Take 0.5-1 tablets (20-40 mg total) by mouth every other day. Alternated a half pill with whole pill every other day (Patient taking differently: Take 40 mg by mouth daily. ) 115 tablet 3 unknown  . glimepiride (AMARYL) 2 MG tablet Take 2 mg by mouth daily.    04/11/2015 at Unknown time  . isosorbide mononitrate (IMDUR) 30 MG 24 hr tablet Take 30 mg by mouth daily.   04/11/2015 at Unknown time  . lisinopril (PRINIVIL,ZESTRIL) 2.5 MG tablet Take 2.5 mg by mouth daily.   04/11/2015 at Unknown time  . metFORMIN (GLUCOPHAGE) 500 MG tablet Take 1,000 mg  by mouth 2 (two) times daily with a meal.   04/11/2015 at Unknown time  . metoprolol (LOPRESSOR) 100 MG tablet Take 100 mg by mouth every morning. & 1/2 by mouth in evening   04/11/2015 at Unknown time  . nitroGLYCERIN (NITROSTAT) 0.4 MG SL tablet Place 0.4 mg under the tongue every 5 (five) minutes as needed.    unknown  . oxyCODONE-acetaminophen (PERCOCET) 7.5-325 MG tablet Take 1 tablet by mouth every 4 (four) hours as needed for severe pain (4-6 hours).   unknown  . tiZANidine (ZANAFLEX) 4 MG tablet Take 4 mg by mouth 2 (two) times daily.    04/11/2015 at Unknown time  . traZODone (DESYREL) 100 MG tablet Take 100 mg by mouth at bedtime.   04/10/2015 at Unknown time  . gabapentin (NEURONTIN) 300 MG capsule Take 300 mg by mouth 3 (three) times daily.   Taking   Scheduled: . antiseptic oral rinse  7 mL Mouth Rinse QID  . azithromycin  500 mg Intravenous Q24H  . bumetanide (BUMEX) IV  2 mg Intravenous Q12H  . cefTRIAXone (ROCEPHIN)  IV  1 g Intravenous Q24H  . chlorhexidine gluconate  15 mL Mouth Rinse BID  . feeding supplement (PRO-STAT SUGAR FREE 64)  60 mL Per Tube TID  . feeding supplement (VITAL HIGH PROTEIN)  1,000 mL Per OG tube Q24H  . insulin aspart  0-15 Units Subcutaneous 6 times per day  . ipratropium-albuterol  3 mL Nebulization Q6H  . methylPREDNISolone (SOLU-MEDROL) injection  40 mg Intravenous Q12H  . pantoprazole (PROTONIX) IV  40 mg Intravenous Daily  . sodium chloride  3 mL Intravenous Q12H   Continuous: . propofol (DIPRIVAN) infusion 50 mcg/kg/min (04/21/15 0701)   FEO:FHQRFXJO (SUBLIMAZE) injection, fentaNYL (SUBLIMAZE) injection, nitroGLYCERIN, ondansetron **OR** ondansetron (ZOFRAN) IV  Assesment: He was admitted with acute hypercapnic respiratory failure. He is intubated and on the ventilator. He will be approaching the need for tracheostomy if he's not able to come off the ventilator in the next 72 hours or so. He is still requiring 65% oxygen but I cut them to 60% this morning. He has community-acquired pneumonia being treated with appropriate antibiotics. He has acute on chronic diastolic heart failure and has diuresed significantly. Principal Problem:   Ventilator dependence ([place]) Active Problems:   Diabetes ([place])   TOBACCO USER   Essential hypertension   CORONARY  ATHEROSCLEROSIS NATIVE CORONARY ARTERY   Pleural effusion, right   Acute respiratory failure with hypercapnia (H[facility])   COPD with acute exacerbation (H[facility])   Diastolic CHF, acute on chronic (HCC)   CAP (community acquired pneumonia)   Acute on chronic diastolic CHF (congestive heart failure), NYHA class 1 ([place])    Plan: Continue current treatments. I reduced his FiO2. If we can get him to the point that he could be extubated we may be able to avoid tracheostomy but he has improved very slowly. He is not ready for any weaning yet and will not be ready until he is down to 45% oxygen. I will be out of town for the next 2 days but will return on 1228.    LOS: 10 days   Sophee Mckimmy L 04/21/2015, 10:20 AM

## 2015-04-21 NOTE — Progress Notes (Signed)
[facility] PROGRESS NOTE  York GriceMarvin C Scarpino EAV:[id number] DOB: 22-Jul-1940    PCP:   [name],ASHISH, MD   HPI: patient with hx of CAD, CHF, DM, prior CVA, HTN, GERD, admitted for hypercapneic hypoxic respiratory failure, having bilateral pleural effusions, PNA, CHF, and intubated was requiring FIO2 Up to 80 percent, 5 peep, and getting TF. He is followed closely by pulmonary and cardiology. When his peep was increased he desat more, so it was reverted back to 5. IR was requested for thoracocentesis, but did not feel he had enough fluid to tap previously. His oxygenation has improved a little. Bumex has helped some, and was increased by Cardiology to 2mg  IV Q12 hours. Repeat CT of the chest showed some left pleural effusion, and he has severe atelectasis of the left lower lobe. Dr Tyron RussellBoles performed thoracocentesis, and removed 270 cc of parapneumonic effusion. He improved very slowly with diuresis. No significant event over the past 24 hours.    Rewiew of Systems: Unable.   Past Medical History  Diagnosis Date  . CAD (coronary artery disease)     Multivessel status post stent to LAD 1998 then CABG 2003  . Myocardial infarction (HCC)   . History of stroke 601997  . Type 2 diabetes mellitus (HCC)   . Essential hypertension   . Dyslipidemia   . Migraine headache   . GERD (gastroesophageal reflux disease)   . Anxiety   . Cervical disc disease     Past Surgical History  Procedure Laterality Date  . Coronary artery bypass graft  2003    Dr. Cornelius Moraswen - LIMA to LAD, SVG to OM, SVG to RCA  . Carotid endarterectomy Left   . Back surgery    . Anterior cervical decomp/discectomy fusion      Medications:  HOME MEDS: Prior to Admission medications   Medication Sig Start Date End Date Taking? Authorizing Provider  amLODipine (NORVASC) 10 MG tablet Take 10 mg by mouth daily.   Yes Historical Provider, MD  atorvastatin (LIPITOR) 20 MG tablet Take 20 mg by mouth daily.   Yes  Historical Provider, MD  clonazePAM (KLONOPIN) 1 MG tablet Take 1 mg by mouth 2 (two) times daily.   Yes Historical Provider, MD  clopidogrel (PLAVIX) 75 MG tablet Take 75 mg by mouth daily.   Yes Historical Provider, MD  esomeprazole (NEXIUM) 40 MG capsule Take 40 mg by mouth daily before breakfast.   Yes Historical Provider, MD  furosemide (LASIX) 40 MG tablet Take 0.5-1 tablets (20-40 mg total) by mouth every other day. Alternated a half pill with whole pill every other day Patient taking differently: Take 40 mg by mouth daily.  03/14/15  Yes Jonelle SidleSamuel G McDowell, MD  glimepiride (AMARYL) 2 MG tablet Take 2 mg by mouth daily.    Yes Historical Provider, MD  isosorbide mononitrate (IMDUR) 30 MG 24 hr tablet Take 30 mg by mouth daily.   Yes Historical Provider, MD  lisinopril (PRINIVIL,ZESTRIL) 2.5 MG tablet Take 2.5 mg by mouth daily.   Yes Historical Provider, MD  metFORMIN (GLUCOPHAGE) 500 MG tablet Take 1,000 mg by mouth 2 (two) times daily with a meal.   Yes Historical Provider, MD  metoprolol (LOPRESSOR) 100 MG tablet Take 100 mg by mouth every morning. & 1/2 by mouth in evening   Yes Historical Provider, MD  nitroGLYCERIN (NITROSTAT) 0.4 MG SL tablet Place 0.4 mg under the tongue every 5 (five) minutes as needed.   Yes Historical Provider, MD  oxyCODONE-acetaminophen (PERCOCET) 7.5-325  MG tablet Take 1 tablet by mouth every 4 (four) hours as needed for severe pain (4-6 hours).   Yes Historical Provider, MD  tiZANidine (ZANAFLEX) 4 MG tablet Take 4 mg by mouth 2 (two) times daily.    Yes Historical Provider, MD  traZODone (DESYREL) 100 MG tablet Take 100 mg by mouth at bedtime.   Yes Historical Provider, MD  gabapentin (NEURONTIN) 300 MG capsule Take 300 mg by mouth 3 (three) times daily.    Historical Provider, MD     Allergies:  Allergies  Allergen Reactions  . Ibuprofen Hives    Social History:   reports that he has been smoking Cigarettes.  He has been smoking about 0.50 packs per  day. He does not have any smokeless tobacco history on file. He reports that he does not drink alcohol or use illicit drugs.  Family History: Family History  Problem Relation Age of Onset  . Heart attack Father   . Heart attack Mother   . Heart attack Brother      Physical Exam: Filed Vitals:   04/21/15 0800 04/21/15 0801 04/21/15 0900 04/21/15 1000  BP: 119/72 119/72 128/69 112/62  Pulse: 74 74 71 64  Temp:  97.7 F (36.5 C)    TempSrc:  Axillary    Resp: 13 14 14 14   Height:      Weight:      SpO2: 95% 96% 96% 93%   Blood pressure 112/62, pulse 64, temperature 97.7 F (36.5 C), temperature source Axillary, resp. rate 14, height 5\' 6"  (1.676 m), weight 75.9 kg (167 lb 5.3 oz), SpO2 93 %.   GEN: intubated and sedated.  HEENT: Mucous membranes pink and anicteric; no cervical lymphadenopathy nor thyromegaly or carotid bruit; no JVD; There were no stridor. Neck is very supple. Breasts:: Not examined CHEST: Normal respiration, mechanically ventilated.  HEART: Regular rate and rhythm. There are no murmur, rub, or gallops.  BACK: No kyphosis or scoliosis; no CVA tenderness ABDOMEN: soft and non-tender; no masses, no organomegaly, normal abdominal bowel sounds; no pannus; no intertriginous candida. There is no rebound and no distention. Rectal Exam: Not done EXTREMITIES: No bone or joint deformity; age-appropriate arthropathy of the hands and knees; no edema; no ulcerations. There is no calf tenderness. Genitalia: not examined PULSES: 2+ and symmetric SKIN: Normal hydration no rash or ulceration CNS: Sedated and Ventilated.     Labs on Admission:  Basic Metabolic Panel:  Recent Labs Lab 04/16/15 0441 04/17/15 0445 04/18/15 0444 04/21/15 0842  NA 141 142  --  147*  K 4.5 3.7  --  2.8*  CL 98* 99*  --  107  CO2 32 30  --  32  GLUCOSE 153* 132*  --  146*  BUN 57* 57*  --  104*  CREATININE 1.39* 1.33* 1.28* 1.26*  CALCIUM 8.6* 9.0  --  9.5   Liver Function  Tests:  Recent Labs Lab 04/21/15 0842  AST 21  ALT 14*  ALKPHOS 50  BILITOT 0.8  PROT 6.8  ALBUMIN 3.3*   CBC:  Recent Labs Lab 04/16/15 0441 04/17/15 0445 04/21/15 0842  WBC 4.9 4.6 PENDING  NEUTROABS  --   --  PENDING  HGB 11.3* 11.7* 13.1  HCT 40.5 40.5 45.9  MCV 88.8 88.2 90.0  PLT 149* 136* PENDING   CBG:  Recent Labs Lab 04/20/15 1622 04/20/15 1947 04/20/15 2351 04/21/15 0418 04/21/15 0740  GLUCAP 146* 129* 167* 147* 118*     Radiological Exams on  Admission: Dg Chest Port 1 View  04/21/2015  CLINICAL DATA:  Respiratory failure EXAM: PORTABLE CHEST 1 VIEW COMPARISON:  04/19/2015 FINDINGS: The ET tube tip is above the carina. There is a a right arm PICC line with tip in the cavoatrial junction. Heart size is normal. There is a moderate left pleural effusion identified no right pleural effusion. Diminished aeration to the left lung base noted. IMPRESSION: 1. Decreased aeration to the left lung base with associated left pleural effusion. 2. Stable support apparatus. Electronically Signed   By: Signa Kell M.D.   On: 04/21/2015 09:04   Dg Chest Port 1 View  04/19/2015  CLINICAL DATA:  RIGHT pleural effusion post thoracentesis EXAM: PORTABLE CHEST 1 VIEW COMPARISON:  Portable exam 1412 hours compared to 0602 hours FINDINGS: Decrease in RIGHT pleural effusion and RIGHT basilar atelectasis versus earlier study. No definite pneumothorax. Stable endotracheal tube, nasogastric tube and RIGHT arm PICC line. Enlargement of cardiac silhouette post CABG. Atelectasis and effusion at LEFT base remain. Atherosclerotic calcification aorta. IMPRESSION: No pneumothorax following RIGHT thoracentesis. Electronically Signed   By: Ulyses Southward M.D.   On: 04/19/2015 14:37   US Thoracentesis Asp Pleural Space W/img Guide  04/19/2015  CLINICAL DATA:  RIGHT pleural effusion, RIGHT lower lobe atelectasis, high oxygen requirements, on ventilator EXAM: ULTRASOUND GUIDED DIAGNOSTIC AND  THERAPEUTIC RIGHT THORACENTESIS COMPARISON:  Chest CT 04/17/2014 PROCEDURE: Procedure, benefits, and risks of procedure were discussed with patient's wife. Written informed consent for procedure was obtained. Time out protocol followed. Procedure performed portably in ICU. Patient placed in an LPO projection. Pleural effusion localized by ultrasound at the lateral RIGHT hemithorax. Skin prepped and draped in usual sterile fashion. Skin and soft tissues anesthetized with 10 mL of 1% lidocaine. Under direct sonographic visualization, 5 [place] Yueh catheter placed into the RIGHT pleural space. 270 mL of yellow RIGHT pleural fluid was aspirated by syringe. Due to limited positioning of patient for access of fluid, was unable to aspirate additional fluid. Procedure tolerated well by patient without immediate complication. COMPLICATIONS: None. FINDINGS: A total of approximately 270 mL of RIGHT pleural fluid was removed. A fluid sample of 180 mL was sent for laboratory analysis. IMPRESSION: Successful ultrasound guided RIGHT thoracentesis yielding 270 mL of pleural fluid. Electronically Signed   By: Ulyses Southward M.D.   On: 04/19/2015 14:42   Assessment/Plan  .Acute on chronic diastolic CHF -Per history EF appears to be 50-60%. -2-D echo: EF 55-60%, grade 1 diastolic dysfunction.  IV bumex on increasing dose-Appreciate cardiology consultation.   Replete K today.   Respiratory failure: Obtain repeat CT without contrast.   Intend to obtain thoracocentesis if possible for today. I spoke with Dr [name] and Dr Juanetta Gosling. All agreed. Lovenox was held.   Continue with current antibiotics per pulmonary.  If prolong intubation, will need tracheostomy. Currently at 65 percent FIO2, will need continuation of weaning.   History of coronary artery disease -Appears stable at this venue.  Elevated troponin -Mild elevation to 0.05. Likely represents demand ischemia given complexity of overall illness and  respiratory failure. -2-D echo: Ejection fraction of 55-60%, normal wall motion, grade 1 diastolic dysfunction. -No further workup planned for at present.  Acute renal failure -Improving. -Related to acute illness, ATN. -Monitor renal function. -May worsen with diuresis given acute CHF, will continue with more aggressive diuresis.    Code Status: DNR.  Family Communication:Wife Jackie at bedside 12/18 and updated on plan of care and all questions answered. Disposition Plan: Keep in  ICU. Other plans as per orders.   Houston Siren, MD.  FACP [facility] Pager [phone] 7pm to 7am.  04/21/2015, 10:48 AM

## 2015-04-22 LAB — BASIC METABOLIC PANEL
ANION GAP: 12 (ref 5–15)
BUN: 118 mg/dL — ABNORMAL HIGH (ref 6–20)
CO2: 31 mmol/L (ref 22–32)
Calcium: 9.9 mg/dL (ref 8.9–10.3)
Chloride: 106 mmol/L (ref 101–111)
Creatinine, Ser: 1.35 mg/dL — ABNORMAL HIGH (ref 0.61–1.24)
GFR calc Af Amer: 59 mL/min — ABNORMAL LOW (ref >60)
GFR, EST NON AFRICAN AMERICAN: 50 mL/min — AB (ref >60)
GLUCOSE: 171 mg/dL — AB (ref 65–99)
POTASSIUM: 3.4 mmol/L — AB (ref 3.5–5.1)
Sodium: 149 mmol/L — ABNORMAL HIGH (ref 135–145)

## 2015-04-22 LAB — BLOOD GAS, ARTERIAL
Acid-Base Excess: 3.8 mmol/L — ABNORMAL HIGH (ref 0.0–2.0)
BICARBONATE: 27.1 meq/L — AB (ref 20.0–24.0)
Drawn by: 22223
FIO2: 50
LHR: 14 {breaths}/min
O2 Saturation: 92.3 %
PEEP: 5 cmH2O
PO2 ART: 75.1 mmHg — AB (ref 80.0–100.0)
Patient temperature: 37
TCO2: 17.2 mmol/L (ref 0–100)
VT: 540 mL
pCO2 arterial: 49 mmHg — ABNORMAL HIGH (ref 35.0–45.0)
pH, Arterial: 7.384 (ref 7.350–7.450)

## 2015-04-22 LAB — GLUCOSE, CAPILLARY
GLUCOSE-CAPILLARY: 152 mg/dL — AB (ref 65–99)
GLUCOSE-CAPILLARY: 157 mg/dL — AB (ref 65–99)
GLUCOSE-CAPILLARY: 184 mg/dL — AB (ref 65–99)
Glucose-Capillary: 155 mg/dL — ABNORMAL HIGH (ref 65–99)
Glucose-Capillary: 173 mg/dL — ABNORMAL HIGH (ref 65–99)

## 2015-04-22 MED ORDER — PRO-STAT SUGAR FREE PO LIQD
45.0000 mL | Freq: Three times a day (TID) | ORAL | Status: DC
  Administered 2015-04-22 – 2015-04-23 (×2): 45 mL
  Filled 2015-04-22 (×2): qty 60

## 2015-04-22 MED ORDER — VITAL HIGH PROTEIN PO LIQD
1000.0000 mL | ORAL | Status: DC
  Administered 2015-04-23: 1000 mL via OROGASTRIC
  Filled 2015-04-22 (×2): qty 1000

## 2015-04-22 MED ORDER — POTASSIUM CHLORIDE 20 MEQ/15ML (10%) PO SOLN
40.0000 meq | Freq: Every day | ORAL | Status: DC
  Administered 2015-04-22 – 2015-04-23 (×2): 40 meq
  Filled 2015-04-22 (×2): qty 30

## 2015-04-22 MED ORDER — BUMETANIDE 0.25 MG/ML IJ SOLN
1.0000 mg | Freq: Every day | INTRAMUSCULAR | Status: DC
  Administered 2015-04-23: 1 mg via INTRAVENOUS
  Filled 2015-04-22: qty 4

## 2015-04-22 MED ORDER — NYSTATIN-TRIAMCINOLONE 100000-0.1 UNIT/GM-% EX CREA
TOPICAL_CREAM | Freq: Four times a day (QID) | CUTANEOUS | Status: DC
  Administered 2015-04-22 – 2015-04-26 (×18): via TOPICAL
  Filled 2015-04-22 (×4): qty 15

## 2015-04-22 NOTE — Progress Notes (Signed)
[facility] PROGRESS NOTE  Miguel Mendoza QMV:[id number] DOB: Sep 03, 1940    PCP:   [name], MD   HPI:   HPI: patient with hx of CAD, CHF, DM, prior CVA, HTN, GERD, admitted for hypercapneic hypoxic respiratory failure, having bilateral pleural effusions, PNA, CHF, and intubated was requiring FIO2 Up to 80 percent, 5 peep, and getting TF. He is followed closely by pulmonary and cardiology. When his peep was increased he desat more, so it was reverted back to 5. IR was requested for thoracocentesis, but did not feel he had enough fluid to tap previously. His oxygenation has improved a little. Bumex has helped some, and was increased by Cardiology to  IV Q12 hours. Repeat CT of the chest showed some left pleural effusion, and he has severe atelectasis of the left lower lobe. Dr [name] performed thoracocentesis, and removed 270 cc of parapneumonic effusion. He improved very slowly with diuresis. No significant event over the past 24 hours.  He is now on FIO2 of 50%.  Rewiew of Systems:    Past Medical History  Diagnosis Date  . CAD (coronary artery disease)     Multivessel status post stent to LAD 1998 then CABG 2003  . Myocardial infarction (HCC)   . History of stroke 77  . Type 2 diabetes mellitus (HCC)   . Essential hypertension   . Dyslipidemia   . Migraine headache   . GERD (gastroesophageal reflux disease)   . Anxiety   . Cervical disc disease     Past Surgical History  Procedure Laterality Date  . Coronary artery bypass graft  2003    Dr. Cornelius Moras - LIMA to LAD, SVG to OM, SVG to RCA  . Carotid endarterectomy Left   . Back surgery    . Anterior cervical decomp/discectomy fusion      Medications:  HOME MEDS: Prior to Admission medications   Medication Sig Start Date End Date Taking? Authorizing Provider  amLODipine (NORVASC) 10 MG tablet Take 10 mg by mouth daily.   Yes Historical Provider, MD  atorvastatin (LIPITOR) 20 MG tablet Take 20 mg by  mouth daily.   Yes Historical Provider, MD  clonazePAM (KLONOPIN) 1 MG tablet Take 1 mg by mouth 2 (two) times daily.   Yes Historical Provider, MD  clopidogrel (PLAVIX) 75 MG tablet Take 75 mg by mouth daily.   Yes Historical Provider, MD  esomeprazole (NEXIUM) 40 MG capsule Take 40 mg by mouth daily before breakfast.   Yes Historical Provider, MD  furosemide (LASIX) 40 MG tablet Take 0.5-1 tablets (20-40 mg total) by mouth every other day. Alternated a half pill with whole pill every other day Patient taking differently: Take 40 mg by mouth daily.  03/14/15  Yes Jonelle Sidle, MD  glimepiride (AMARYL) 2 MG tablet Take 2 mg by mouth daily.    Yes Historical Provider, MD  isosorbide mononitrate (IMDUR) 30 MG 24 hr tablet Take 30 mg by mouth daily.   Yes Historical Provider, MD  lisinopril (PRINIVIL,ZESTRIL) 2.5 MG tablet Take 2.5 mg by mouth daily.   Yes Historical Provider, MD  metFORMIN (GLUCOPHAGE) 500 MG tablet Take 1,000 mg by mouth 2 (two) times daily with a meal.   Yes Historical Provider, MD  metoprolol (LOPRESSOR) 100 MG tablet Take 100 mg by mouth every morning. & 1/2 by mouth in evening   Yes Historical Provider, MD  nitroGLYCERIN (NITROSTAT) 0.4 MG SL tablet Place 0.4 mg under the tongue every 5 (five) minutes as needed.  Yes Historical Provider, MD  oxyCODONE-acetaminophen (PERCOCET) 7.5-325 MG tablet Take 1 tablet by mouth every 4 (four) hours as needed for severe pain (4-6 hours).   Yes Historical Provider, MD  tiZANidine (ZANAFLEX) 4 MG tablet Take 4 mg by mouth 2 (two) times daily.    Yes Historical Provider, MD  traZODone (DESYREL) 100 MG tablet Take 100 mg by mouth at bedtime.   Yes Historical Provider, MD  gabapentin (NEURONTIN) 300 MG capsule Take 300 mg by mouth 3 (three) times daily.    Historical Provider, MD     Allergies:  Allergies  Allergen Reactions  . Ibuprofen Hives    Social History:   reports that he has been smoking Cigarettes.  He has been smoking  about 0.50 packs per day. He does not have any smokeless tobacco history on file. He reports that he does not drink alcohol or use illicit drugs.  Family History: Family History  Problem Relation Age of Onset  . Heart attack Father   . Heart attack Mother   . Heart attack Brother      Physical Exam: Filed Vitals:   04/22/15 0745 04/22/15 0758 04/22/15 0800 04/22/15 0829  BP:   101/67   Pulse:   70 7  Temp: 97.8 F (36.6 C)     TempSrc: Axillary     Resp:   12 14  Height:      Weight:      SpO2:  91% 91% 92%   Blood pressure 101/67, pulse 7, temperature 97.8 F (36.6 C), temperature source Axillary, resp. rate 14, height  (1.676 m), weight 76 kg (167 lb 8.8 oz), SpO2 92 %.  GEN: intubated and sedated.  HEENT: Mucous membranes pink and anicteric; no cervical lymphadenopathy nor thyromegaly or carotid bruit; no JVD; There were no stridor. Neck is very supple. Breasts:: Not examined CHEST: Normal respiration, mechanically ventilated.  HEART: Regular rate and rhythm. There are no murmur, rub, or gallops.  BACK: No kyphosis or scoliosis; no CVA tenderness ABDOMEN: soft and non-tender; no masses, no organomegaly, normal abdominal bowel sounds; no pannus; no intertriginous candida. There is no rebound and no distention. Rectal Exam: Not done EXTREMITIES: No bone or joint deformity; age-appropriate arthropathy of the hands and knees; no edema; no ulcerations. There is no calf tenderness. Genitalia: not examined PULSES: 2+ and symmetric SKIN: Normal hydration He has some candida cruri.  CNS: Sedated and Ventilated.    Labs on Admission:  Basic Metabolic Panel:  Recent Labs Lab 04/16/15 0441 04/17/15 0445 04/18/15 0444 04/21/15 0842 04/22/15 0450  NA 141 142  --  147* 149*  K 4.5 3.7  --  2.8* 3.4*  CL 98* 99*  --  107 106  CO2 32 30  --  32 31  GLUCOSE 153* 132*  --  146* 171*  BUN 57* 57*  --  104* 118*  CREATININE 1.39* 1.33* 1.28* 1.26* 1.35*  CALCIUM  8.6* 9.0  --  9.5 9.9   Liver Function Tests:  Recent Labs Lab 04/21/15 0842  AST 21  ALT 14*  ALKPHOS 50  BILITOT 0.8  PROT 6.8  ALBUMIN 3.3*   CBC:  Recent Labs Lab 04/16/15 0441 04/17/15 0445 04/21/15 0842  WBC 4.9 4.6 9.8  NEUTROABS  --   --  8.0*  HGB 11.3* 11.7* 13.1  HCT 40.5 40.5 45.9  MCV 88.8 88.2 90.0  PLT 149* 136* 171    CBG:  Recent Labs Lab 04/21/15 1701 04/21/15 1937 04/21/15  2326 04/22/15 0426 04/22/15 0724  GLUCAP 110* 147* 246* 155* 152*     Radiological Exams on Admission: Dg Chest Port 1 View  04/21/2015  CLINICAL DATA:  Respiratory failure EXAM: PORTABLE CHEST 1 VIEW COMPARISON:  04/19/2015 FINDINGS: The ET tube tip is above the carina. There is a a right arm PICC line with tip in the cavoatrial junction. Heart size is normal. There is a moderate left pleural effusion identified no right pleural effusion. Diminished aeration to the left lung base noted. IMPRESSION: 1. Decreased aeration to the left lung base with associated left pleural effusion. 2. Stable support apparatus. Electronically Signed   By: Signa Kellaylor  Stroud M.D.   On: 04/21/2015 09:04    Assessment/Plan Present on Admission:  . Pleural effusion, right . CORONARY ATHEROSCLEROSIS NATIVE CORONARY ARTERY . Essential hypertension . TOBACCO USER   .Acute on chronic diastolic CHF -Per history EF appears to be 50-60%. -2-D echo: EF 55-60%, grade 1 diastolic dysfunction.  IV bumex on increasing dose-Appreciate cardiology consultation.  Will decrease now to 1mg  Bumex IV per day.   Repleted K, will maintain K liquid NGT supplement daily.   Respiratory failure: Obtain repeat CT without contrast.   Intend to obtain thoracocentesis if possible for today. I spoke with Dr Tyron RussellBoles and Dr Juanetta GoslingHawkins. All agreed. Lovenox was held.   Continue with current antibiotics per pulmonary.  If prolong intubation, will need tracheostomy. Currently at 50 percent FIO2, will need continuation of  weaning.   History of coronary artery disease -Appears stable at this venue.  Elevated troponin -Mild elevation to 0.05. Likely represents demand ischemia given complexity of overall illness and respiratory failure. -2-D echo: Ejection fraction of 55-60%, normal wall motion, grade 1 diastolic dysfunction. -No further workup planned for at present.  Acute renal failure -Improving. -Related to acute illness, ATN. -Monitor renal function. -May worsen with diuresis given acute CHF, will continue with more aggressive diuresis.    Code Status: DNR.  Family Communication:Wife Jackie at bedside 12/18 and updated on plan of care and all questions answered. Disposition Plan: Keep in ICU. Other plans as per orders.  Houston SirenLE,Vonnie Ligman, MD.  FACP [facility] Pager [phone] 7pm to 7am.  04/22/2015, 9:20 AM

## 2015-04-22 NOTE — Progress Notes (Signed)
Pt is alert and following commands for RT with SBTs.  Pt took about 6 hrs for sedation medication to wear off.  Please use as little sedation as possible over night so that weaning runs smoothly in the AM.

## 2015-04-22 NOTE — Progress Notes (Addendum)
Nutrition Follow-up  INTERVENTION:   Vital HP advanced to 30 ml/hr via OG tube per MD  Decrease Prostat to 45 ml TID.  NUTRITION DIAGNOSIS:   Inadequate oral intake related to inability to eat as evidenced by NPO status.  GOAL:   Provide needs based on ASPEN/SCCM guidelines   MONITOR:   Vent status, Labs, Weight trends  REASON FOR ASSESSMENT:   Ventilator/ Consult to manage tube feeding    ASSESSMENT:  Patient remains intubated on ventilator support due to acute respiratory failure. He has hx of COPD, CHF, DM and CAD. Pt is slowing improving per MD note. Possible weaning attempt in the morning.   MV: 7.8 L/min Temp (24hrs), Avg:97.3 F (36.3 C), Min:96.4 F (35.8 C), Max:98.3 F (36.8 C)  Propofol: 25.7 ml/hr -providing 678 kcal lipids at current rate every 24 hr  Labs: Sodium 149, potassium 3.4, glucose 171, BUN 118, and Cr 1.35.    Diet Order:  Diet NPO time specified  Skin:   intact  Last BM:   12/2  Height:   Ht Readings from Last 1 Encounters:  04/22/15 5\' 6"  (1.676 m)    Weight:   Wt Readings from Last 1 Encounters:  04/22/15 167 lb 8.8 oz (76 kg)  Admit weight-90.7 kg  Ideal Body Weight:  67 kg  BMI:  Body mass index is 27.06 kg/(m^2).  Estimated Nutritional Needs:   Kcal:  1472   Protein:  115-129 gr  Fluid:  Negative fluid balance per MD fluid goal   EDUCATION NEEDS:   No education needs identified at this time  Royann ShiversLynn Jan Walters MS,RD,CSG,LDN Office: #[phone] Pager: [phone]

## 2015-04-23 LAB — BLOOD GAS, ARTERIAL
Acid-Base Excess: 2.4 mmol/L — ABNORMAL HIGH (ref 0.0–2.0)
BICARBONATE: 26.3 meq/L — AB (ref 20.0–24.0)
Drawn by: 317771
FIO2: 0.45
LHR: 14 {breaths}/min
O2 SAT: 92.1 %
PCO2 ART: 41.7 mmHg (ref 35.0–45.0)
PEEP: 5 cmH2O
PO2 ART: 71.8 mmHg — AB (ref 80.0–100.0)
TCO2: 17.4 mmol/L (ref 0–100)
VT: 540 mL
pH, Arterial: 7.419 (ref 7.350–7.450)

## 2015-04-23 LAB — GLUCOSE, CAPILLARY
GLUCOSE-CAPILLARY: 171 mg/dL — AB (ref 65–99)
GLUCOSE-CAPILLARY: 173 mg/dL — AB (ref 65–99)
GLUCOSE-CAPILLARY: 239 mg/dL — AB (ref 65–99)
Glucose-Capillary: 232 mg/dL — ABNORMAL HIGH (ref 65–99)

## 2015-04-23 LAB — BASIC METABOLIC PANEL
ANION GAP: 12 (ref 5–15)
BUN: 128 mg/dL — ABNORMAL HIGH (ref 6–20)
CALCIUM: 10.4 mg/dL — AB (ref 8.9–10.3)
CO2: 28 mmol/L (ref 22–32)
Chloride: 115 mmol/L — ABNORMAL HIGH (ref 101–111)
Creatinine, Ser: 1.58 mg/dL — ABNORMAL HIGH (ref 0.61–1.24)
GFR, EST AFRICAN AMERICAN: 48 mL/min — AB (ref >60)
GFR, EST NON AFRICAN AMERICAN: 42 mL/min — AB (ref >60)
GLUCOSE: 218 mg/dL — AB (ref 65–99)
POTASSIUM: 3.7 mmol/L (ref 3.5–5.1)
Sodium: 155 mmol/L — ABNORMAL HIGH (ref 135–145)

## 2015-04-23 LAB — MAGNESIUM: MAGNESIUM: 2.9 mg/dL — AB (ref 1.7–2.4)

## 2015-04-23 MED ORDER — DILTIAZEM LOAD VIA INFUSION
10.0000 mg | Freq: Once | INTRAVENOUS | Status: DC
  Filled 2015-04-23: qty 10

## 2015-04-23 MED ORDER — METOPROLOL TARTRATE 1 MG/ML IV SOLN
5.0000 mg | Freq: Four times a day (QID) | INTRAVENOUS | Status: DC
  Administered 2015-04-23: 5 mg via INTRAVENOUS
  Filled 2015-04-23 (×2): qty 5

## 2015-04-23 MED ORDER — DEXTROSE 5 % IV SOLN
INTRAVENOUS | Status: AC
  Filled 2015-04-23: qty 100

## 2015-04-23 MED ORDER — METOPROLOL TARTRATE 1 MG/ML IV SOLN
5.0000 mg | Freq: Once | INTRAVENOUS | Status: AC
  Administered 2015-04-23: 5 mg via INTRAVENOUS
  Filled 2015-04-23: qty 5

## 2015-04-23 MED ORDER — DEXTROSE 5 % IV SOLN
INTRAVENOUS | Status: DC
  Administered 2015-04-24 – 2015-04-26 (×6): via INTRAVENOUS

## 2015-04-23 MED ORDER — METOPROLOL TARTRATE 1 MG/ML IV SOLN
INTRAVENOUS | Status: AC
  Filled 2015-04-23: qty 5

## 2015-04-23 MED ORDER — METOPROLOL TARTRATE 1 MG/ML IV SOLN
5.0000 mg | Freq: Once | INTRAVENOUS | Status: AC
  Administered 2015-04-23: 5 mg via INTRAVENOUS

## 2015-04-23 MED ORDER — SODIUM CHLORIDE 0.9 % IV BOLUS (SEPSIS)
250.0000 mL | Freq: Once | INTRAVENOUS | Status: AC
  Administered 2015-04-23: 250 mL via INTRAVENOUS

## 2015-04-23 MED ORDER — PREDNISONE 20 MG PO TABS
60.0000 mg | ORAL_TABLET | Freq: Every day | ORAL | Status: DC
  Administered 2015-04-24: 60 mg via ORAL
  Filled 2015-04-23: qty 3

## 2015-04-23 MED ORDER — METOPROLOL TARTRATE 25 MG PO TABS
25.0000 mg | ORAL_TABLET | Freq: Two times a day (BID) | ORAL | Status: DC
  Administered 2015-04-23: 25 mg
  Filled 2015-04-23: qty 1

## 2015-04-23 MED ORDER — DEXTROSE 5 % IV SOLN
5.0000 mg/h | INTRAVENOUS | Status: DC
  Administered 2015-04-23: 5 mg/h via INTRAVENOUS
  Administered 2015-04-23: 10 mg/h via INTRAVENOUS
  Administered 2015-04-24: 15 mg/h via INTRAVENOUS
  Filled 2015-04-23 (×3): qty 100

## 2015-04-23 NOTE — Procedures (Signed)
Extubation Procedure Note  Patient Details:   Name: York GriceMarvin C Oakey DOB: 26-May-1940 MRN: [id number]   Airway Documentation:  Airway 7.5 mm (Active)  Secured at (cm) 24 cm 04/13/2015  3:23 PM  Measured From Lips 04/14/2015  8:00 PM  Secured Location Center 04/14/2015  8:00 PM  Secured By [employer] Tube Holder 04/14/2015  8:00 PM  Tube Holder Repositioned Yes 04/14/2015  8:00 PM  Cuff Pressure (cm H2O) 24 cm H2O 04/13/2015  3:23 PM  Site Condition Dry 04/14/2015  8:00 PM     Airway 7.5 mm (Active)  Secured at (cm) 23 cm 04/23/2015 11:26 AM  Measured From Lips 04/23/2015 11:26 AM  Secured Location Center 04/23/2015 11:26 AM  Secured By [employer] Tube Holder 04/23/2015 11:26 AM  Tube Holder Repositioned Yes 04/23/2015 11:26 AM  Cuff Pressure (cm H2O) 27 cm H2O 04/23/2015  7:57 AM  Site Condition Dry 04/23/2015 11:26 AM    Evaluation  O2 sats: stable throughout Complications: No apparent complications Patient did tolerate procedure well. Bilateral Breath Sounds: Rhonchi Suctioning: Oral No  Katheren Shamsowell, Ellinor Test Farmer 04/23/2015, 11:39 AM

## 2015-04-23 NOTE — Progress Notes (Signed)
Consulting cardiologist: Dietrich Patesoss, Arian Mcquitty MD Primary Cardiologist: Nona DellMcDowell, Samuel MD  Cardiology Specific Problem List: 1. CAD-Multivessel 2. Hx of CABG 3. Diastolic CHF 4. Hypertension  Subjective:   Intubated  Appear in NAD    Objective:   Temp:  [97.1 F (36.2 C)-99.6 F (37.6 C)] 99.6 F (37.6 C) (12/27 0748) Pulse Rate:  [80-112] 105 (12/27 0804) Resp:  [14-25] 25 (12/27 0804) BP: (105-140)/(61-83) 130/70 mmHg (12/27 0700) SpO2:  [89 %-92 %] 92 % (12/27 0804) FiO2 (%):  [45 %] 45 % (12/27 0804) Weight:  [166 lb 0.1 oz (75.3 kg)] 166 lb 0.1 oz (75.3 kg) (12/27 0500) Last BM Date: 04/22/15  Filed Weights   04/21/15 0500 04/22/15 0500 04/23/15 0500  Weight: 167 lb 5.3 oz (75.9 kg) 167 lb 8.8 oz (76 kg) 166 lb 0.1 oz (75.3 kg)    Intake/Output Summary (Last 24 hours) at 04/23/15 1006 Last data filed at 04/23/15 0928  Gross per 24 hour  Intake 956.18 ml  Output   2575 ml  Net -1618.82 ml    Telemetry: Sinus tachycardia rates in the 120's.  Exam:  General: Intubated.  Awake  Going through breathing trial  Sats 89%  HEENT: Conjunctiva and lids normal, oropharynx clear.  Lungs: Clear to auscultation, nonlabored.Coarse breath sounds.   Cardiac: No elevated JVP or bruits. RRR, tachycardic no gallop or rub.   Abdomen: Normoactive bowel sounds, nontender, nondistended.  Extremities: No pitting edema, distal pulses full.  Neuropsychiatric:Sedated  Lab Results:  Basic Metabolic Panel:  Recent Labs Lab 04/17/15 0445 04/18/15 0444 04/21/15 0842 04/22/15 0450  NA 142  --  147* 149*  K 3.7  --  2.8* 3.4*  CL 99*  --  107 106  CO2 30  --  32 31  GLUCOSE 132*  --  146* 171*  BUN 57*  --  104* 118*  CREATININE 1.33* 1.28* 1.26* 1.35*  CALCIUM 9.0  --  9.5 9.9    Liver Function Tests:  Recent Labs Lab 04/21/15 0842  AST 21  ALT 14*  ALKPHOS 50  BILITOT 0.8  PROT 6.8  ALBUMIN 3.3*    CBC:  Recent Labs Lab 04/17/15 0445 04/21/15 0842  WBC  4.6 9.8  HGB 11.7* 13.1  HCT 40.5 45.9  MCV 88.2 90.0  PLT 136* 171   Echocardiogram 04/12/2015 Left ventricle: The cavity size was normal. Systolic function was normal. The estimated ejection fraction was in the range of 55% to 60%. Wall motion was normal; there were no regional wall motion abnormalities. Doppler parameters are consistent with abnormal left ventricular relaxation (grade 1 diastolic dysfunction). Mild basal septal hypertrophy. - Ventricular septum: Septal motion showed abnormal function and dyssynergy. These changes are consistent with intraventricular conduction delay. - Aortic valve: Moderately to severely calcified annulus. Trileaflet; mildly thickened, mildly calcified leaflets. 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 Peak velocity (S): 247 cm/s. Mean gradient (S): 12 mm Hg. Valve area (Vmean): 1.45 cm^2. - Mitral valve: Calcified annulus. Mildly thickened leaflets . - Left atrium: The atrium was mildly to moderately dilated. - Right ventricle: The cavity size was moderately dilated. Systolic function was moderately reduced. - Tricuspid valve: There was mild regurgitation. - Systemic veins: Unable to accurately assess respiratory variation as patient is ventilated.  Radiology: 04/21/2015 IMPRESSION: 1. Decreased aeration to the left lung base with associated left pleural effusion. 2. Stable support apparatus.    Medications:   Scheduled Medications: . antiseptic oral rinse  7 mL Mouth Rinse QID  .  azithromycin  500 mg Intravenous Q24H  . bumetanide (BUMEX) IV  1 mg Intravenous Daily  . cefTRIAXone (ROCEPHIN)  IV  1 g Intravenous Q24H  . chlorhexidine gluconate  15 mL Mouth Rinse BID  . feeding supplement (PRO-STAT SUGAR FREE 64)  45 mL Per Tube TID  . feeding supplement (VITAL HIGH PROTEIN)  1,000 mL Per OG tube Q24H  . insulin aspart  0-15 Units Subcutaneous 6 times per day  . ipratropium-albuterol  3 mL Nebulization Q6H  .  methylPREDNISolone (SOLU-MEDROL) injection  40 mg Intravenous Q12H  . nystatin-triamcinolone   Topical QID  . pantoprazole (PROTONIX) IV  40 mg Intravenous Daily  . potassium chloride  40 mEq Per Tube Daily  . sodium chloride  3 mL Intravenous Q12H    Infusions: . propofol (DIPRIVAN) infusion 5 mcg/kg/min (04/23/15 0748)    PRN Medications: fentaNYL (SUBLIMAZE) injection, fentaNYL (SUBLIMAZE) injection, nitroGLYCERIN, ondansetron **OR** ondansetron (ZOFRAN) IV   Assessment and Plan:   1. CAD: No evid for active ischemia  Pt is tachycardic this am in the 120's. Will begin per oral gastric tube, metoprolol tartrate 25 mg BID.  Follow BP  .   2.Acute on Chronic Diastolic CHF: He has diuresed 9.6 liters with creatinine slowly rising. BUN severely elevated  (Concerning for source, question bleeding ) Would hold further IV diuresis and switch to 40 PO lasix in AM  Follow labs   3. Pleural Effusion: S/P thoracentesis  04/19/2015 removing 270 cc of pleural fluid.   4. VDRF: Management per Dr. Juanetta Gosling. Continues on IV antibiotics     Bettey Mare. Lawrence NP [facility]  04/23/2015, 10:06 AM   Pt seen and examined  I agree with findings as noted by Harriet Pho  I have amended to reflect my findings  Resp status remains tenuous  . WIll continue tof follow    Dietrich Pates

## 2015-04-23 NOTE — Progress Notes (Signed)
[facility] PROGRESS NOTE  Miguel GriceMarvin C Mendoza AOZ:[id number] DOB: 01-23-1941    PCP:   [name],ASHISH, MD   HPI: patient with hx of CAD, CHF, DM, prior CVA, HTN, GERD, admitted for hypercapneic hypoxic respiratory failure, having bilateral pleural effusions, PNA, CHF, and intubated was requiring FIO2 Up to 80 percent, 5 peep, and getting TF. He is followed closely by pulmonary and cardiology. When his peep was increased he desat more, so it was reverted back to 5. IR was requested for thoracocentesis, but did not feel he had enough fluid to tap previously. His oxygenation has improved a little. Bumex has helped some, and was increased by Cardiology to 2mg  IV Q12 hours. Repeat CT of the chest showed some left pleural effusion, and he has severe atelectasis of the left lower lobe. Dr Tyron RussellBoles performed thoracocentesis, and removed 270 cc of parapneumonic effusion. He improved very slowly with diuresis. No significant event over the past 24 hours. He is now on FIO2 of 45, with parameters amenable to extubation.  We again talked about DNR code status, with Annice PihJackie, another best friend of her, and I spoke with her sister.  We confirmed full DNR code status, including no reintubation.    Rewiew of Systems:  unable.   Past Medical History  Diagnosis Date  . CAD (coronary artery disease)     Multivessel status post stent to LAD 1998 then CABG 2003  . Myocardial infarction (HCC)   . History of stroke 841997  . Type 2 diabetes mellitus (HCC)   . Essential hypertension   . Dyslipidemia   . Migraine headache   . GERD (gastroesophageal reflux disease)   . Anxiety   . Cervical disc disease     Past Surgical History  Procedure Laterality Date  . Coronary artery bypass graft  2003    Dr. Cornelius Moraswen - LIMA to LAD, SVG to OM, SVG to RCA  . Carotid endarterectomy Left   . Back surgery    . Anterior cervical decomp/discectomy fusion      Medications:  HOME MEDS: Prior to Admission medications    Medication Sig Start Date End Date Taking? Authorizing Provider  amLODipine (NORVASC) 10 MG tablet Take 10 mg by mouth daily.   Yes Historical Provider, MD  atorvastatin (LIPITOR) 20 MG tablet Take 20 mg by mouth daily.   Yes Historical Provider, MD  clonazePAM (KLONOPIN) 1 MG tablet Take 1 mg by mouth 2 (two) times daily.   Yes Historical Provider, MD  clopidogrel (PLAVIX) 75 MG tablet Take 75 mg by mouth daily.   Yes Historical Provider, MD  esomeprazole (NEXIUM) 40 MG capsule Take 40 mg by mouth daily before breakfast.   Yes Historical Provider, MD  furosemide (LASIX) 40 MG tablet Take 0.5-1 tablets (20-40 mg total) by mouth every other day. Alternated a half pill with whole pill every other day Patient taking differently: Take 40 mg by mouth daily.  03/14/15  Yes Jonelle SidleSamuel G McDowell, MD  glimepiride (AMARYL) 2 MG tablet Take 2 mg by mouth daily.    Yes Historical Provider, MD  isosorbide mononitrate (IMDUR) 30 MG 24 hr tablet Take 30 mg by mouth daily.   Yes Historical Provider, MD  lisinopril (PRINIVIL,ZESTRIL) 2.5 MG tablet Take 2.5 mg by mouth daily.   Yes Historical Provider, MD  metFORMIN (GLUCOPHAGE) 500 MG tablet Take 1,000 mg by mouth 2 (two) times daily with a meal.   Yes Historical Provider, MD  metoprolol (LOPRESSOR) 100 MG tablet Take 100 mg by  mouth every morning. & 1/2 by mouth in evening   Yes Historical Provider, MD  nitroGLYCERIN (NITROSTAT) 0.4 MG SL tablet Place 0.4 mg under the tongue every 5 (five) minutes as needed.   Yes Historical Provider, MD  oxyCODONE-acetaminophen (PERCOCET) 7.5-325 MG tablet Take 1 tablet by mouth every 4 (four) hours as needed for severe pain (4-6 hours).   Yes Historical Provider, MD  tiZANidine (ZANAFLEX) 4 MG tablet Take 4 mg by mouth 2 (two) times daily.    Yes Historical Provider, MD  traZODone (DESYREL) 100 MG tablet Take 100 mg by mouth at bedtime.   Yes Historical Provider, MD  gabapentin (NEURONTIN) 300 MG capsule Take 300 mg by mouth 3  (three) times daily.    Historical Provider, MD     Allergies:  Allergies  Allergen Reactions  . Ibuprofen Hives    Social History:   reports that he has been smoking Cigarettes.  He has been smoking about 0.50 packs per day. He does not have any smokeless tobacco history on file. He reports that he does not drink alcohol or use illicit drugs.  Family History: Family History  Problem Relation Age of Onset  . Heart attack Father   . Heart attack Mother   . Heart attack Brother      Physical Exam: Filed Vitals:   04/23/15 0800 04/23/15 0804 04/23/15 0900 04/23/15 1000  BP: 152/82  172/87 153/97  Pulse: 108 105 121 128  Temp:      TempSrc:      Resp: Height:      Weight:      SpO2: 93% 92% 93% 92%   Blood pressure 153/97, pulse 128, temperature 99.6 F (37.6 C), temperature source Axillary, resp. rate 22, height  (1.676 m), weight 75.3 kg (166 lb 0.1 oz), SpO2 92 %.  GEN:  Pleasant  patient lying in the stretcher in no acute distress; cooperative with exam. PSYCH:  alert and oriented x4; does not appear anxious or depressed; affect is appropriate. HEENT: Mucous membranes pink and anicteric; PERRLA; EOM intact; no cervical lymphadenopathy nor thyromegaly or carotid bruit; no JVD; There were no stridor. Neck is very supple. Breasts:: Not examined CHEST WALL: No tenderness CHEST: Normal respiration, clear to auscultation bilaterally.  HEART: Regular rate and rhythm.  There are no murmur, rub, or gallops.   BACK: No kyphosis or scoliosis; no CVA tenderness ABDOMEN: soft and non-tender; no masses, no organomegaly, normal abdominal bowel sounds; no pannus; no intertriginous candida. There is no rebound and no distention. Rectal Exam: Not done EXTREMITIES: No bone or joint deformity; age-appropriate arthropathy of the hands and knees; no edema; no ulcerations.  There is no calf tenderness. Genitalia: not examined PULSES: 2+ and symmetric SKIN: Normal  hydration no rash or ulceration CNS: Cranial nerves 2-12 grossly intact no focal lateralizing neurologic deficit.  Speech is fluent; uvula elevated with phonation, facial symmetry and tongue midline. DTR are normal bilaterally, cerebella exam is intact, barbinski is negative and strengths are equaled bilaterally.  No sensory loss.   Labs on Admission:  Basic Metabolic Panel:  Recent Labs Lab 04/17/15 0445 04/18/15 0444 04/21/15 0842 04/22/15 0450  NA 142  --  147* 149*  K 3.7  --  2.8* 3.4*  CL 99*  --  107 106  CO2 30  --  32 31  GLUCOSE 132*  --  146* 171*  BUN 57*  --  104* 118*  CREATININE 1.33* 1.28* 1.26*  1.35*  CALCIUM 9.0  --  9.5 9.9   Liver Function Tests:  Recent Labs Lab 04/21/15 0842  AST 21  ALT 14*  ALKPHOS 50  BILITOT 0.8  PROT 6.8  ALBUMIN 3.3*   No results for input(s): LIPASE, AMYLASE in the last 168 hours. No results for input(s): AMMONIA in the last 168 hours. CBC:  Recent Labs Lab 04/17/15 0445 04/21/15 0842  WBC 4.6 9.8  NEUTROABS  --  8.0*  HGB 11.7* 13.1  HCT 40.5 45.9  MCV 88.2 90.0  PLT 136* 171   Cardiac Enzymes: No results for input(s): CKTOTAL, CKMB, CKMBINDEX, TROPONINI in the last 168 hours.  CBG:  Recent Labs Lab 04/22/15 1938 04/23/15 0005 04/23/15 0345 04/23/15 0732 04/23/15 1125  GLUCAP 184* 239* 173* 171* 232*   Assessment/Plan Present on Admission:  . Pleural effusion, right . CORONARY ATHEROSCLEROSIS NATIVE CORONARY ARTERY . Essential hypertension . TOBACCO USER   PLAN: .Acute on chronic diastolic CHF -Per history EF appears to be 50-60%. -2-D echo: EF 55-60%, grade 1 diastolic dysfunction.  IV bumex on increasing dose-Appreciate cardiology consultation.Will change to oral lasix   Repleted K, will maintain K liquid NGT supplement daily.   Respiratory failure:    S/P thoracocentesis, parapneumonic effusion, 270 cc.   Extubate today, keep in ICU.   Full DNR, bipap PRN.  History of coronary  artery disease -Appears stable at this venue.  Elevated troponin -Mild elevation to 0.05. Likely represents demand ischemia given complexity of overall illness and respiratory failure. -2-D echo: Ejection fraction of 55-60%, normal wall motion, grade 1 diastolic dysfunction. -No further workup planned for at present.  Acute renal failure -Improving. -Related to acute illness, ATN. -Monitor renal function. -May worsen with diuresis given acute CHF, will continue with more aggressive diuresis.    Code Status: DNR.  Family Communication:Wife Jackie at bedside 12/18 and updated on plan of care and all questions answered. Disposition Plan: Keep in ICU. Other plans as per orders.   Houston Siren, MD.  FACP [facility] Pager [phone] 7pm to 7am.  04/23/2015, 11:36 AM

## 2015-04-23 NOTE — Progress Notes (Signed)
Extubated Pt per Dr. Conley RollsLe. NIF -20, FVC .8L., Pt followed simple direction.. Family kept code status DNR but RT can use the BIPAP. SATS 92% on 50% VM, HR 114, RR 24, 147/90.

## 2015-04-23 NOTE — Progress Notes (Signed)
Wasted 33cc of Propofol in sink with Alisia FerrariErica Spangler, RN. Schonewitz, Candelaria StagersLeigh Anne 04/23/2015

## 2015-04-23 NOTE — Care Management Note (Signed)
Case Management Note  Patient Details  Name: York GriceMarvin C Lichtenberg MRN: [id number] Date of Birth: Apr 19, 1941   Expected Discharge Date:  04/15/15               Expected Discharge Plan:  Home w Home Health Services  In-House Referral:  NA  Discharge planning Services  CM Consult  Post Acute Care Choice:    Choice offered to:     DME Arranged:    DME Agency:     HH Arranged:    HH Agency:     Status of Service:  In process, will continue to follow  Medicare Important Message Given:  Yes Date Medicare IM Given:    Medicare IM give by:    Date Additional Medicare IM Given:    Additional Medicare Important Message give by:     If discussed at Long Length of Stay Meetings, dates discussed:  04/23/2015  Additional Comments:  [name], Jarrett Albor Demske, RN 04/23/2015, 3:52 PM

## 2015-04-24 DIAGNOSIS — E870 Hyperosmolality and hypernatremia: Secondary | ICD-10-CM

## 2015-04-24 DIAGNOSIS — N19 Unspecified kidney failure: Secondary | ICD-10-CM | POA: Insufficient documentation

## 2015-04-24 DIAGNOSIS — Z9889 Other specified postprocedural states: Secondary | ICD-10-CM | POA: Insufficient documentation

## 2015-04-24 DIAGNOSIS — R000 Tachycardia, unspecified: Secondary | ICD-10-CM | POA: Insufficient documentation

## 2015-04-24 DIAGNOSIS — I25708 Atherosclerosis of coronary artery bypass graft(s), unspecified, with other forms of angina pectoris: Secondary | ICD-10-CM

## 2015-04-24 DIAGNOSIS — I25810 Atherosclerosis of coronary artery bypass graft(s) without angina pectoris: Secondary | ICD-10-CM | POA: Insufficient documentation

## 2015-04-24 LAB — BASIC METABOLIC PANEL
ANION GAP: 12 (ref 5–15)
BUN: 142 mg/dL — ABNORMAL HIGH (ref 6–20)
CALCIUM: 10.2 mg/dL (ref 8.9–10.3)
CO2: 29 mmol/L (ref 22–32)
Chloride: 117 mmol/L — ABNORMAL HIGH (ref 101–111)
Creatinine, Ser: 1.39 mg/dL — ABNORMAL HIGH (ref 0.61–1.24)
GFR, EST AFRICAN AMERICAN: 56 mL/min — AB (ref >60)
GFR, EST NON AFRICAN AMERICAN: 49 mL/min — AB (ref >60)
GLUCOSE: 219 mg/dL — AB (ref 65–99)
Potassium: 3.4 mmol/L — ABNORMAL LOW (ref 3.5–5.1)
SODIUM: 158 mmol/L — AB (ref 135–145)

## 2015-04-24 LAB — GLUCOSE, CAPILLARY
GLUCOSE-CAPILLARY: 163 mg/dL — AB (ref 65–99)
Glucose-Capillary: 188 mg/dL — ABNORMAL HIGH (ref 65–99)
Glucose-Capillary: 181 mg/dL — ABNORMAL HIGH (ref 65–99)

## 2015-04-24 LAB — CULTURE, BODY FLUID-BOTTLE: CULTURE: NO GROWTH

## 2015-04-24 LAB — CULTURE, BODY FLUID W GRAM STAIN -BOTTLE

## 2015-04-24 LAB — SODIUM
SODIUM: 156 mmol/L — AB (ref 135–145)
SODIUM: 157 mmol/L — AB (ref 135–145)
SODIUM: 154 mmol/L — AB (ref 135–145)

## 2015-04-24 MED ORDER — CETYLPYRIDINIUM CHLORIDE 0.05 % MT LIQD
7.0000 mL | Freq: Two times a day (BID) | OROMUCOSAL | Status: DC
  Administered 2015-04-25 – 2015-04-26 (×3): 7 mL via OROMUCOSAL

## 2015-04-24 MED ORDER — INSULIN ASPART 100 UNIT/ML ~~LOC~~ SOLN
4.0000 [IU] | Freq: Three times a day (TID) | SUBCUTANEOUS | Status: DC
  Administered 2015-04-24 – 2015-04-26 (×6): 4 [IU] via SUBCUTANEOUS

## 2015-04-24 MED ORDER — INSULIN ASPART 100 UNIT/ML ~~LOC~~ SOLN: 0.0000 [IU] | Freq: Every day | SUBCUTANEOUS | Status: DC

## 2015-04-24 MED ORDER — INSULIN ASPART 100 UNIT/ML ~~LOC~~ SOLN
0.0000 [IU] | Freq: Three times a day (TID) | SUBCUTANEOUS | Status: DC
  Administered 2015-04-24 – 2015-04-26 (×7): 3 [IU] via SUBCUTANEOUS

## 2015-04-24 MED ORDER — POTASSIUM CHLORIDE CRYS ER 20 MEQ PO TBCR
40.0000 meq | EXTENDED_RELEASE_TABLET | Freq: Every day | ORAL | Status: DC
  Administered 2015-04-24 – 2015-04-26 (×3): 40 meq via ORAL
  Filled 2015-04-24 (×3): qty 2

## 2015-04-24 MED ORDER — METOPROLOL TARTRATE 25 MG PO TABS
25.0000 mg | ORAL_TABLET | Freq: Two times a day (BID) | ORAL | Status: DC
  Administered 2015-04-24 (×2): 25 mg via ORAL
  Filled 2015-04-24 (×2): qty 1

## 2015-04-24 MED ORDER — METOPROLOL TARTRATE 1 MG/ML IV SOLN
2.5000 mg | Freq: Four times a day (QID) | INTRAVENOUS | Status: DC
  Administered 2015-04-24: 2.5 mg via INTRAVENOUS
  Filled 2015-04-24 (×2): qty 5

## 2015-04-24 MED ORDER — PREDNISONE 20 MG PO TABS: 40.0000 mg | ORAL_TABLET | Freq: Every day | ORAL | Status: DC

## 2015-04-24 NOTE — Progress Notes (Addendum)
RN, Mysti, and this NP speaking several times so far this shift related to pt's HR and rhythm. Yesterday, pt was seen by cardiology and started on Cardizem drip. Tonight, pt's HR was still as high as 160s but EKG didn't show Aflutter or Afib, ? Junctional rhythm. Metoprolol IV prn has been given twice with good results in HR so far. HR is presently in the 80s and ATC metoprolol IV changed to 2.5mg  IV q6 instead of 5mg  ATC. Labs were ordered by this NP because of HR and rhythm. Mg 2.9, Na 155 and creat worsening.  Plan: 1. Worsening renal function-likely due to use of Lasix but was needed/dehydration. Will hydrate pt gently with D5W at 75cc/hr.  2. Hypermagnesia-likely due to worsening renal function. Manage with IVF for now. Pt is on no obvious meds with Mg++ containing products.  3. Hypernatremia-likely due to dehydration. IVF as above with serial checks of Na/BMP. Careful not to correct too quickly, but rate of 75cc/hr should not cause quick correction.  Will follow. Plan discussed with RN, Mysti.  Jimmye NormanKaren Kirby-Graham, NP [facility] 12/28 am labs show increased Na to 158. D5W increased to 100cc/hr with r/p Na in about 6 hours. Creat improved a little after IVF started.  KJKG, NP [facility]

## 2015-04-24 NOTE — Evaluation (Signed)
Physical Therapy Evaluation Patient Details Name: Miguel Mendoza MRN: [id number] DOB: [birth year] Today's Date: 04/24/2015   History of Present Illness  This is a 74 year old man, diabetic, history of coronary artery disease and chronic diastolic congestive heart failure, now presents with dyspnea the last couple of days. He denies any significant cough or fever. There is no chest pain. [facility] showed mild left ventricular hypertrophy with ejection fraction 55-60%. In the emergency room, he was hypoxemic and required BiPAP. However he seems to be comfortable on the BiPAP. He is going to be admitted for further management now.  Pt eventually was placed on a ventilator where he remained for about 11 days.  Clinical Impression  Pt was seen for evaluation.  He was awake but very lethargic and had difficulty maintaining a focus on task at hand.  He is normally independent with ADLs using a cane (by his report).  Today he was found to have profound generalized weakness through the extremities, trunk and neck.  He is unable to rotate his head past neutral to the right.  He was able to follow directions to a small degree and then lost focus on the commands.  He was too weak to try to mobilize in the bed.  He will need to transition the SNF at d/c which I explained to him.    Follow Up Recommendations SNF    Equipment Recommendations  None recommended by PT    Recommendations for Other Services   OT    Precautions / Restrictions Precautions Precautions: Fall Restrictions Weight Bearing Restrictions: No      Mobility  Bed Mobility               General bed mobility comments: unable to attempt mobilization due to lethargy  Transfers                    Ambulation/Gait                Stairs            Wheelchair Mobility    Modified Rankin (Stroke Patients Only)       Balance                                              Pertinent Vitals/Pain Pain Assessment: No/denies pain    Home Living Family/patient expects to be discharged to:: Skilled nursing facility Living Arrangements: Spouse/significant other                    Prior Function Level of Independence: Independent with assistive device(s)         Comments: used a cane for gait     Hand Dominance        Extremity/Trunk Assessment   Upper Extremity Assessment: Defer to OT evaluation           Lower Extremity Assessment: Generalized weakness (extreme weakness throughout)      Cervical / Trunk Assessment: Other exceptions  Communication   Communication: Expressive difficulties (difficulty speaking, probably from endotracheal  tube)  Cognition Arousal/Alertness: Lethargic Behavior During Therapy: Flat affect Overall Cognitive Status: No family/caregiver present to determine baseline cognitive functioning                      General Comments  Exercises        Assessment/Plan    PT Assessment Patient needs continued PT services  PT Diagnosis Generalized weakness;Difficulty walking   PT Problem List Decreased strength;Decreased activity tolerance;Decreased mobility;Cardiopulmonary status limiting activity  PT Treatment Interventions Functional mobility training;Therapeutic exercise   PT Goals (Current goals can be found in the Care Plan section) Acute Rehab PT Goals Patient Stated Goal: none stated PT Goal Formulation: With patient Time For Goal Achievement: 05/08/15 Potential to Achieve Goals: Good    Frequency Min 3X/week   Barriers to discharge        Co-evaluation               End of Session Equipment Utilized During Treatment: Oxygen Activity Tolerance: Patient limited by fatigue Patient left: in bed;with call bell/phone within reach Nurse Communication: Mobility status         Time: 1610-9604 PT Time Calculation (min) (ACUTE ONLY): 33  min   Charges:   PT Evaluation $Initial PT Evaluation Tier I: 1 Procedure     PT G CodesMyrlene Broker L  PT 04/24/2015, 2:24 PM [phone]

## 2015-04-24 NOTE — Progress Notes (Signed)
Patient's PICC line alarming "partial occluison". Flushed line with saline without difficulty, but it did not clear the occlusion. Changed dressing site and found catheter to appear pulled out from entry site. IV pump off at this time. Notified vascular access team to assess PICC.

## 2015-04-24 NOTE — Progress Notes (Signed)
Subjective: He was able to be extubated. He's had some other problems however including worsening of his renal function hypermagnesemia hypernatremia. He overall seems to be doing better.  Objective: Vital signs in last 24 hours: Temp:  [98.1 F (36.7 C)-99.6 F (37.6 C)] 98.1 F (36.7 C) (12/28 0400) Pulse Rate:  [68-166] 68 (12/28 0645) Resp:  [16-30] 16 (12/28 0645) BP: (112-172)/(64-97) 132/70 mmHg (12/28 0645) SpO2:  [9 %-96 %] 95 % (12/28 0645) FiO2 (%):  [45 %-60 %] 60 % (12/27 1409) Weight:  [73.5 kg (162 lb 0.6 oz)] 73.5 kg (162 lb 0.6 oz) (12/28 0500) Weight change: -1.8 kg (-3 lb 15.5 oz) Last BM Date: 04/23/15  Intake/Output from previous day: 12/27 0701 - 12/28 0700 In: 936.4 [I.V.:616.4; NG/GT:20; IV Piggyback:300] Out: 2250 [Urine:2250]  PHYSICAL EXAM General appearance: alert and mild distress Resp: rhonchi bilaterally Cardio: regular rate and rhythm, S1, S2 normal, no murmur, click, rub or gallop GI: soft, non-tender; bowel sounds normal; no masses,  no organomegaly Extremities: extremities normal, atraumatic, no cyanosis or edema  Lab Results:  Results for orders placed or performed during the hospital encounter of 04/11/15 (from the past 48 hour(s))  Glucose, capillary     Status: Abnormal   Collection Time: 04/22/15 11:17 AM  Result Value Ref Range   Glucose-Capillary 173 (H) 65 - 99 mg/dL   Comment 1 Notify RN    Comment 2 Document in Chart   Glucose, capillary     Status: Abnormal   Collection Time: 04/22/15  3:05 PM  Result Value Ref Range   Glucose-Capillary 157 (H) 65 - 99 mg/dL  Blood gas, arterial     Status: Abnormal   Collection Time: 04/22/15  3:12 PM  Result Value Ref Range   FIO2 45.00    Delivery systems VENTILATOR    Mode CONTINUOUS POSITIVE AIRWAY PRESSURE    Peep/cpap 5.0 cm H20   Pressure support 5 cm H20   pH, Arterial 7.279 (L) 7.350 - 7.450   pCO2 arterial 62.1 (HH) 35.0 - 45.0 mmHg    Comment: CRITICAL RESULT CALLED TO,  READ BACK BY AND VERIFIED WITH: MICHAEL GRAY RN BY ROBIN POWELL RRT ON 04/22/15 AY 1515    pO2, Arterial 88.7 80.0 - 100.0 mmHg   Bicarbonate 24.5 (H) 20.0 - 24.0 mEq/L   O2 Saturation 88.7 %   Collection site LEFT RADIAL    Drawn by [id number]    Sample type ARTERIAL    Allens test (pass/fail) PASS PASS  Glucose, capillary     Status: Abnormal   Collection Time: 04/22/15  7:38 PM  Result Value Ref Range   Glucose-Capillary 184 (H) 65 - 99 mg/dL  Glucose, capillary     Status: Abnormal   Collection Time: 04/23/15 12:05 AM  Result Value Ref Range   Glucose-Capillary 239 (H) 65 - 99 mg/dL  Glucose, capillary     Status: Abnormal   Collection Time: 04/23/15  3:45 AM  Result Value Ref Range   Glucose-Capillary 173 (H) 65 - 99 mg/dL  Blood gas, arterial     Status: Abnormal   Collection Time: 04/23/15  4:55 AM  Result Value Ref Range   FIO2 0.45    Delivery systems VENTILATOR    Mode PRESSURE REGULATED VOLUME CONTROL    VT 540 mL   LHR 14.0 resp/min   Peep/cpap 5.0 cm H20   pH, Arterial 7.419 7.350 - 7.450   pCO2 arterial 41.7 35.0 - 45.0 mmHg   pO2, Arterial  71.8 (L) 80.0 - 100.0 mmHg   Bicarbonate 26.3 (H) 20.0 - 24.0 mEq/L   TCO2 17.4 0 - 100 mmol/L   Acid-Base Excess 2.4 (H) 0.0 - 2.0 mmol/L   O2 Saturation 92.1 %   Collection site LEFT RADIAL    Drawn by [id number]    Sample type ARTERIAL DRAW    Allens test (pass/fail) PASS PASS  Glucose, capillary     Status: Abnormal   Collection Time: 04/23/15  7:32 AM  Result Value Ref Range   Glucose-Capillary 171 (H) 65 - 99 mg/dL   Comment 1 Notify RN    Comment 2 Document in Chart   Glucose, capillary     Status: Abnormal   Collection Time: 04/23/15 11:25 AM  Result Value Ref Range   Glucose-Capillary 232 (H) 65 - 99 mg/dL   Comment 1 Notify RN   Basic metabolic panel     Status: Abnormal   Collection Time: 04/23/15 10:10 PM  Result Value Ref Range   Sodium 155 (H) 135 - 145 mmol/L   Potassium 3.7 3.5 - 5.1 mmol/L   Chloride  115 (H) 101 - 111 mmol/L   CO2 28 22 - 32 mmol/L   Glucose, Bld 218 (H) 65 - 99 mg/dL   BUN 128 (H) 6 - 20 mg/dL    Comment: RESULTS CONFIRMED BY MANUAL DILUTION   Creatinine, Ser 1.58 (H) 0.61 - 1.24 mg/dL   Calcium 10.4 (H) 8.9 - 10.3 mg/dL   GFR calc non Af Amer 42 (L) >60 mL/min   GFR calc Af Amer 48 (L) >60 mL/min    Comment: (NOTE) The eGFR has been calculated using the CKD EPI equation. This calculation has not been validated in all clinical situations. eGFR's persistently <60 mL/min signify possible Chronic Kidney Disease.    Anion gap 12 5 - 15  Magnesium     Status: Abnormal   Collection Time: 04/23/15 10:10 PM  Result Value Ref Range   Magnesium 2.9 (H) 1.7 - 2.4 mg/dL  Basic metabolic panel     Status: Abnormal (Preliminary result)   Collection Time: 04/24/15  4:30 AM  Result Value Ref Range   Sodium 158 (H) 135 - 145 mmol/L   Potassium 3.4 (L) 3.5 - 5.1 mmol/L   Chloride 117 (H) 101 - 111 mmol/L   CO2 29 22 - 32 mmol/L   Glucose, Bld 219 (H) 65 - 99 mg/dL   BUN PENDING 6 - 20 mg/dL   Creatinine, Ser 1.39 (H) 0.61 - 1.24 mg/dL   Calcium 10.2 8.9 - 10.3 mg/dL   GFR calc non Af Amer 49 (L) >60 mL/min   GFR calc Af Amer 56 (L) >60 mL/min    Comment: (NOTE) The eGFR has been calculated using the CKD EPI equation. This calculation has not been validated in all clinical situations. eGFR's persistently <60 mL/min signify possible Chronic Kidney Disease.    Anion gap 12 5 - 15    ABGS  Recent Labs  04/23/15 0455  PHART 7.419  PO2ART 71.8*  TCO2 17.4  HCO3 26.3*   CULTURES Recent Results (from the past 240 hour(s))  Culture, respiratory (NON-Expectorated)     Status: None   Collection Time: 04/14/15  9:32 PM  Result Value Ref Range Status   Specimen Description TRACHEAL ASPIRATE  Final   Special Requests NONE  Final   Gram Stain   Final    MODERATE WBC PRESENT, PREDOMINANTLY PMN NO SQUAMOUS EPITHELIAL CELLS SEEN MODERATE GRAM POSITIVE  RODS Performed  at [place] Performed at [facility]    Report Status 04/17/2015 FINAL  Final  Culture, expectorated sputum-assessment     Status: None   Collection Time: 04/14/15  9:48 PM  Result Value Ref Range Status   Specimen Description ENDOTRACHEAL  Final   Special Requests NONE  Final   Sputum evaluation THIS SPECIMEN IS ACCEPTABLE FOR SPUTUM CULTURE  Final   Report Status 04/14/2015 FINAL  Final  Culture, body fluid-bottle     Status: None (Preliminary result)   Collection Time: 04/19/15 12:22 PM  Result Value Ref Range Status   Specimen Description PLEURAL  Final   Special Requests NONE  Final   Culture   Final    NO GROWTH 3 DAYS Performed at [facility]    Report Status PENDING  Incomplete  Gram stain     Status: None   Collection Time: 04/19/15 12:22 PM  Result Value Ref Range Status   Specimen Description PLEURAL  Final   Special Requests NONE  Final   Gram Stain   Final    CYTOSPIN SMEAR WBC PRESENT, PREDOMINANTLY MONONUCLEAR NO ORGANISMS SEEN Performed at [facility]    Report Status 04/20/2015 FINAL  Final  Culture, body fluid-bottle     Status: None (Preliminary result)   Collection Time: 04/19/15  2:30 PM  Result Value Ref Range Status   Specimen Description PLEURAL  Final   Special Requests NONE  Final   Culture NO GROWTH 4 DAYS  Final   Report Status PENDING  Incomplete   Studies/Results: No results found.  Medications:  Prior to Admission:  Prescriptions prior to admission  Medication Sig Dispense Refill Last Dose  . amLODipine (NORVASC) 10 MG tablet Take 10 mg by mouth daily.   04/11/2015 at Unknown time  . atorvastatin (LIPITOR) 20 MG tablet Take 20 mg by mouth daily.   04/11/2015 at Unknown time  . clonazePAM (KLONOPIN) 1 MG tablet Take 1 mg by mouth 2 (two) times daily.   04/11/2015 at Unknown time  . clopidogrel (PLAVIX) 75 MG tablet Take 75 mg by mouth daily.    04/11/2015 at Unknown time  . esomeprazole (NEXIUM) 40 MG capsule Take 40 mg by mouth daily before breakfast.   04/11/2015 at Unknown time  . furosemide (LASIX) 40 MG tablet Take 0.5-1 tablets (20-40 mg total) by mouth every other day. Alternated a half pill with whole pill every other day (Patient taking differently: Take 40 mg by mouth daily. ) 115 tablet 3 unknown  . glimepiride (AMARYL) 2 MG tablet Take 2 mg by mouth daily.    04/11/2015 at Unknown time  . isosorbide mononitrate (IMDUR) 30 MG 24 hr tablet Take 30 mg by mouth daily.   04/11/2015 at Unknown time  . lisinopril (PRINIVIL,ZESTRIL) 2.5 MG tablet Take 2.5 mg by mouth daily.   04/11/2015 at Unknown time  . metFORMIN (GLUCOPHAGE) 500 MG tablet Take 1,000 mg by mouth 2 (two) times daily with a meal.   04/11/2015 at Unknown time  . metoprolol (LOPRESSOR) 100 MG tablet Take 100 mg by mouth every morning. & 1/2 by mouth in evening   04/11/2015 at Unknown time  . nitroGLYCERIN (NITROSTAT) 0.4 MG SL tablet Place 0.4 mg under the tongue every 5 (five) minutes as needed.   unknown  . oxyCODONE-acetaminophen (PERCOCET) 7.5-325 MG tablet Take 1 tablet by mouth every 4 (four)  hours as needed for severe pain (4-6 hours).   unknown  . tiZANidine (ZANAFLEX) 4 MG tablet Take 4 mg by mouth 2 (two) times daily.    04/11/2015 at Unknown time  . traZODone (DESYREL) 100 MG tablet Take 100 mg by mouth at bedtime.   04/10/2015 at Unknown time  . gabapentin (NEURONTIN) 300 MG capsule Take 300 mg by mouth 3 (three) times daily.   Taking   Scheduled: . antiseptic oral rinse  7 mL Mouth Rinse QID  . azithromycin  500 mg Intravenous Q24H  . cefTRIAXone (ROCEPHIN)  IV  1 g Intravenous Q24H  . chlorhexidine gluconate  15 mL Mouth Rinse BID  . diltiazem  10 mg Intravenous Once  . ipratropium-albuterol  3 mL Nebulization Q6H  . metoprolol  2.5 mg Intravenous 4 times per day  . nystatin-triamcinolone   Topical QID  . pantoprazole (PROTONIX) IV  40 mg Intravenous  Daily  . potassium chloride  40 mEq Per Tube Daily  . predniSONE  60 mg Oral Q breakfast  . sodium chloride  3 mL Intravenous Q12H   Continuous: . dextrose 100 mL/hr at 04/24/15 0615  . diltiazem (CARDIZEM) infusion 5 mg/hr (04/24/15 0624)   IRW:[id number], ondansetron **OR** ondansetron (ZOFRAN) IV  Assesment: He was admitted with shortness of breath and acute respiratory failure. He was initially treated with BiPAP but failed BiPAP and ended up having to be intubated and placed on mechanical ventilation. He has community-acquired pneumonia COPD with acute exacerbation and had acute on chronic diastolic heart failure. He was very slow to come off the ventilator but was able to be extubated yesterday. He has multiple other medical problems including acute on chronic renal failure , hypernatremia and hypermagnesemia. Principal Problem:   Ventilator dependence ([place]) Active Problems:   Diabetes ([place])   TOBACCO USER   Essential hypertension   CORONARY ATHEROSCLEROSIS NATIVE CORONARY ARTERY   Pleural effusion, right   Acute respiratory failure with hypercapnia (HCC)   COPD with acute exacerbation (HCC)   Diastolic CHF, acute on chronic (HCC)   CAP (community acquired pneumonia)   Acute on chronic diastolic CHF (congestive heart failure), NYHA class 1 ([place])    Plan: Continue with current treatments for his respiratory status. Her sodium level is somewhat better. Creatinine level somewhat better.    LOS: 13 days   Addilyne Backs L 04/24/2015, 7:33 AM

## 2015-04-24 NOTE — Progress Notes (Signed)
SUBJECTIVE: Denies chest pain. Extubated. Able to take tablets now.     Intake/Output Summary (Last 24 hours) at 04/24/15 1142 Last data filed at 04/24/15 0800  Gross per 24 hour  Intake 791.42 ml  Output   1600 ml  Net -808.58 ml    Current Facility-Administered Medications  Medication Dose Route Frequency Provider Last Rate Last Dose  . antiseptic oral rinse solution (CORINZ)  7 mL Mouth Rinse QID Henderson Cloud, MD   7 mL at 04/24/15 1140  . chlorhexidine gluconate (PERIDEX) 0.12 % solution 15 mL  15 mL Mouth Rinse BID Henderson Cloud, MD   15 mL at 04/24/15 [phone]  . dextrose 5 % solution   Intravenous Continuous Leda Gauze, NP 100 mL/hr at 04/24/15 0800    . diltiazem (CARDIZEM) 1 mg/mL load via infusion 10 mg  10 mg Intravenous Once Houston Siren, MD   10 mg at 04/23/15 1345   And  . diltiazem (CARDIZEM) 100 mg in dextrose 5 % 100 mL (1 mg/mL) infusion  5-15 mg/hr Intravenous Continuous Houston Siren, MD 2.5 mL/hr at 04/24/15 0730 2.5 mg/hr at 04/24/15 0730  . insulin aspart (novoLOG) injection 0-15 Units  0-15 Units Subcutaneous TID WC Estela Isaiah Blakes, MD   3 Units at 04/24/15 1140  . insulin aspart (novoLOG) injection 0-5 Units  0-5 Units Subcutaneous QHS Henderson Cloud, MD      . insulin aspart (novoLOG) injection 4 Units  4 Units Subcutaneous TID WC Estela Isaiah Blakes, MD   4 Units at 04/24/15 1139  . ipratropium-albuterol (DUONEB) 0.5-2.5 (3) MG/3ML nebulizer solution 3 mL  3 mL Nebulization Q6H Estela Isaiah Blakes, MD   3 mL at 04/24/15 0746  . metoprolol (LOPRESSOR) injection 2.5 mg  2.5 mg Intravenous 4 times per day Leda Gauze, NP   2.5 mg at 04/24/15 0555  . nitroGLYCERIN (NITROSTAT) SL tablet 0.4 mg  0.4 mg Sublingual Q5 min PRN Nimish Normajean Glasgow, MD      . nystatin-triamcinolone (MYCOLOG II) cream   Topical QID Houston Siren, MD      . ondansetron [facility]) tablet 4 mg  4 mg Oral Q6H PRN Nimish Normajean Glasgow, MD       Or  . ondansetron (ZOFRAN) injection 4 mg  4 mg Intravenous Q6H PRN Nimish C Gosrani, MD      . pantoprazole (PROTONIX) injection 40 mg  40 mg Intravenous Daily Henderson Cloud, MD   40 mg at 04/24/15 0900  . potassium chloride SA (K-DUR,KLOR-CON) CR tablet 40 mEq  40 mEq Oral Daily Henderson Cloud, MD   40 mEq at 04/24/15 0900  . [START ON 04/25/2015] predniSONE (DELTASONE) tablet 40 mg  40 mg Oral Q breakfast Henderson Cloud, MD      . sodium chloride 0.9 % injection 3 mL  3 mL Intravenous Q12H Wasser Singer, MD   3 mL at 04/22/15 2201    Filed Vitals:   04/24/15 0746 04/24/15 0800 04/24/15 0845 04/24/15 0900  BP:  136/72  142/84  Pulse:  78  81  Temp:   98.3 F (36.8 C)   TempSrc:   Axillary   Resp:  18  18  Height:      Weight:      SpO2: 93% 96%  94%    PHYSICAL EXAM General: NAD, ill appearing HEENT: Normal. Neck: No JVD, no  thyromegaly.  Lungs: Bilateral rhonchi CV: Nondisplaced PMI.  Regular rate and rhythm, normal S1/S2, no S3/S4, no murmur.  No pretibial edema.   Abdomen: Soft, no distention.  Neurologic: Alert.    TELEMETRY: Reviewed telemetry pt in sinus rhythm with PVC's.  LABS: Basic Metabolic Panel:  Recent Labs  40/98/11 2210 04/24/15 0430  NA 155* 158*  K 3.7 3.4*  CL 115* 117*  CO2 28 29  GLUCOSE 218* 219*  BUN 128* 142*  CREATININE 1.58* 1.39*  CALCIUM 10.4* 10.2  MG 2.9*  --    Liver Function Tests: No results for input(s): AST, ALT, ALKPHOS, BILITOT, PROT, ALBUMIN in the last 72 hours. No results for input(s): LIPASE, AMYLASE in the last 72 hours. CBC: No results for input(s): WBC, NEUTROABS, HGB, HCT, MCV, PLT in the last 72 hours. Cardiac Enzymes: No results for input(s): CKTOTAL, CKMB, CKMBINDEX, TROPONINI in the last 72 hours. BNP: Invalid input(s): POCBNP D-Dimer: No results for input(s): DDIMER in the last 72 hours. Hemoglobin A1C: No results for input(s): HGBA1C in the last 72  hours. Fasting Lipid Panel: No results for input(s): CHOL, HDL, LDLCALC, TRIG, CHOLHDL, LDLDIRECT in the last 72 hours. Thyroid Function Tests: No results for input(s): TSH, T4TOTAL, T3FREE, THYROIDAB in the last 72 hours.  Invalid input(s): FREET3 Anemia Panel: No results for input(s): VITAMINB12, FOLATE, FERRITIN, TIBC, IRON, RETICCTPCT in the last 72 hours.  RADIOLOGY: Ct Head Wo Contrast  04/11/2015  CLINICAL DATA:  Altered mental status.  Evaluate for bleed. EXAM: CT HEAD WITHOUT CONTRAST TECHNIQUE: Contiguous axial images were obtained from the base of the skull through the vertex without intravenous contrast. COMPARISON:  02/07/2015 FINDINGS: Sinuses/Soft tissues: Mild motion degradation. Clear paranasal sinuses and mastoid air cells. Intracranial: Cerebral atrophy. 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 Mild low density in the periventricular white matter likely related to small vessel disease. Vertebral and carotid atherosclerosis. No mass lesion, hemorrhage, hydrocephalus, acute infarct, intra-axial, or extra-axial fluid collection. IMPRESSION: 1.  No acute intracranial abnormality. 2. Motion degradation. 3.  Cerebral atrophy and small vessel ischemic change. 4. 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 Electronically Signed   By: Jeronimo Greaves M.D.   On: 04/11/2015 22:28   Ct Chest Wo Contrast  04/18/2015  CLINICAL DATA:  Bilateral pleural effusions. Hypoxic respiratory failure. Pneumonia. EXAM: CT CHEST WITHOUT CONTRAST TECHNIQUE: Multidetector CT imaging of the chest was performed following the standard protocol without IV contrast. COMPARISON:  04/12/2015 FINDINGS: Endotracheal tube is in adequate position. Right-sided PICC line as tip within the SVC. Enteric tube with tip over the mid stomach. Lungs are adequately inflated and demonstrate interval improvement of a moderate size right pleural effusion with associated compressive atelectasis within the  right base. Slight worsening consolidation of the posterior medial left base likely atelectasis with tiny amount of associated pleural fluid. Subtle hazy attenuation in the perihilar regions suggesting minimal interstitial edema. Tiny calcified granuloma over the lateral left upper lobe. Airways are within normal. There is mild stable cardiomegaly. There is calcified plaque over the coronary arteries and thoracic aorta. Median sternotomy wires are present. There has been a slight decrease in size of the precarinal lymph node which now measures 1 cm likely reactive. No evidence of hilar or axillary adenopathy. Remaining mediastinal structures are within normal. Images through the upper abdomen are unchanged. Mild degenerate change of the spine. IMPRESSION: Mild interval decrease in size of a moderate size right pleural effusion with associated compressive atelectasis in the right base. Slight  worsening consolidation over the posterior medial left base likely atelectasis with tiny amount of pleural fluid. Cardiomegaly with findings suggesting subtle interstitial edema. Electronically Signed   By: Elberta Fortis M.D.   On: 04/18/2015 13:50   Ct Chest Wo Contrast  04/12/2015  CLINICAL DATA:  Respiratory failure. EXAM: CT CHEST WITHOUT CONTRAST TECHNIQUE: Multidetector CT imaging of the chest was performed following the standard protocol without IV contrast. COMPARISON:  02/07/2015 FINDINGS: Mediastinum: Previous median sternotomy and CABG procedure. Moderate cardiac enlargement. Aortic atherosclerosis noted. The trachea is patent and midline. The ET tube tip is above the carina. OG tube appears scratch set there is a nasogastric tube within a normal appearing esophagus. Prominent mediastinal nodes are identified. Index right paratracheal lymph node measures 1.4 cm, image 23/ series 2. Unchanged from previous exam. No axillary or supraclavicular adenopathy. Lungs/Pleura: Moderate to large right pleural effusion  identified. There is airspace consolidation involving the entire right lower lobe. This appears progressive when compared with previous exam. Small left pleural effusion with posterior medial consolidation is identified. Upper Abdomen: The adrenal glands are normal. Normal appearance of the liver and spleen. The visualized portions of the pancreas appear normal. Musculoskeletal: Spondylosis noted within the thoracic spine. No aggressive lytic or sclerotic bone lesions. IMPRESSION: 1. Interval increase in volume of right pleural effusion with complete consolidation of the right lower lobe. 2. New posterior medial consolidation of the left lower lobe. 3. Cardiac enlargement and aortic atherosclerosis. 4. Enlarged right paratracheal lymph node. Similar to previous exam. Electronically Signed   By: Signa Kell M.D.   On: 04/12/2015 16:31   [place] Chest  04/12/2015  CLINICAL DATA:  Pleural effusion. EXAM: CHEST ULTRASOUND COMPARISON:  Chest x-ray 04/11/2015. FINDINGS: Small right pleural effusion is noted. Patient could not tolerate positioning for evaluation. Given the small size of the effusion and difficulty in positioning patient, thoracentesis not performed. IMPRESSION: Small pleural effusion.  Thoracentesis not performed. Electronically Signed   By: Maisie Fus  Register   On: 04/12/2015 10:29   Dg Chest Port 1 View  04/21/2015  CLINICAL DATA:  Respiratory failure EXAM: PORTABLE CHEST 1 VIEW COMPARISON:  04/19/2015 FINDINGS: The ET tube tip is above the carina. There is a a right arm PICC line with tip in the cavoatrial junction. Heart size is normal. There is a moderate left pleural effusion identified no right pleural effusion. Diminished aeration to the left lung base noted. IMPRESSION: 1. Decreased aeration to the left lung base with associated left pleural effusion. 2. Stable support apparatus. Electronically Signed   By: Signa Kell M.D.   On: 04/21/2015 09:04   Dg Chest Port 1 View  04/19/2015   CLINICAL DATA:  RIGHT pleural effusion post thoracentesis EXAM: PORTABLE CHEST 1 VIEW COMPARISON:  Portable exam 1412 hours compared to 0602 hours FINDINGS: Decrease in RIGHT pleural effusion and RIGHT basilar atelectasis versus earlier study. No definite pneumothorax. Stable endotracheal tube, nasogastric tube and RIGHT arm PICC line. Enlargement of cardiac silhouette post CABG. Atelectasis and effusion at LEFT base remain. Atherosclerotic calcification aorta. IMPRESSION: No pneumothorax following RIGHT thoracentesis. Electronically Signed   By: Ulyses Southward M.D.   On: 04/19/2015 14:37   Dg Chest Port 1 View  04/19/2015  CLINICAL DATA:  Ventilator.  Respiratory failure EXAM: PORTABLE CHEST 1 VIEW COMPARISON:  04/17/2015 FINDINGS: Prior CABG. There is cardiomegaly. Bilateral lower lobe opacities with layering effusions. Some improvement in aeration at the right lung base since prior study. Otherwise no change. IMPRESSION: Some improvement in  aeration at the right lung base. Otherwise no change. Electronically Signed   By: Charlett Nose M.D.   On: 04/19/2015 08:11   Dg Chest Port 1 View  04/17/2015  CLINICAL DATA:  Respiratory failure. EXAM: PORTABLE CHEST 1 VIEW COMPARISON:  04/16/2015 . FINDINGS: Endotracheal tube, NG tube, right PICC line in stable position. Prior CABG. Cardiomegaly with pulmonary venous congestion. Bilateral lower lobe infiltrates and or edema noted. Bilateral pleural effusions noted. Findings suggest congestive heart failure. Bibasilar pneumonia cannot be excluded. Low lung volumes with basilar atelectasis . No pneumothorax . Prior cervical spine fusion . IMPRESSION: 1. Lines and tubes in stable position. 2. Prior CABG. Cardiomegaly with pulmonary venous congestion. Bibasilar pulmonary infiltrates and or edema noted. Bilateral pleural effusions noted. Findings consistent with congestive heart failure. No significant change from prior exam. 3. Lung volumes with basilar atelectasis.  Electronically Signed   By: Maisie Fus  Register   On: 04/17/2015 07:55   Dg Chest Port 1 View  04/16/2015  CLINICAL DATA:  Altered mental status, respiratory failure EXAM: PORTABLE CHEST 1 VIEW COMPARISON:  04/15/2015 FINDINGS: Endotracheal tube, NG tube and RIGHT PICC line are unchanged. There are low lung volumes and bilateral pleural effusions unchanged. Mild central venous congestion. No pneumothorax. IMPRESSION: 1. Stable support apparatus. 2. Low lung volumes with bilateral pleural effusions with no significant change. Electronically Signed   By: Genevive Bi M.D.   On: 04/16/2015 08:47   Dg Chest Port 1 View  04/15/2015  CLINICAL DATA:  Shortness of breath. EXAM: PORTABLE CHEST 1 VIEW COMPARISON:  04/14/2015.  CT 04/12/2015. FINDINGS: Endotracheal tube and right PICC line stable position PICC line in stable position. Prior CABG. Cardiomegaly with pulmonary vascular prominence and interstitial prominence suggesting mild congestive heart failure. Persistent right mid and lower lung infiltrate. Small bilateral pleural effusions. No pneumothorax . IMPRESSION: 1. Endotracheal tube and right PICC line in stable position. 2. Prior CABG. Stable cardiomegaly. Mild pulmonary vascular prominence interstitial prominence noted suggesting mild congestive heart failure. Small bilateral pleural effusions noted. 3. Set right mid lung and lower lung infiltrate. Electronically Signed   By: Maisie Fus  Register   On: 04/15/2015 07:43   Dg Chest Port 1 View  04/14/2015  CLINICAL DATA:  Acute respiratory failure, disorientation. EXAM: PORTABLE CHEST 1 VIEW COMPARISON:  Chest x-ray and chest CT dated 04/12/2015, chest x-ray dated 04/11/2015 and chest x-ray dated 02/07/2015. FINDINGS: Endotracheal tube appears well positioned with tip just above the level of the carina. Enteric tube passes below the diaphragm. A right subclavian central line is better seen on previous chest CT, presumably obscured on this chest x-ray by the  numerous lines overlying the right chest and mediastinum. Lung aeration is improved bilaterally. Suspect decreased right pleural effusion. Persistent dense opacities at each lung base compatible with the atelectasis identified on earlier chest CT. IMPRESSION: Improved aeration of both lungs indicating improved fluid status. Suspect at least some decrease in the size of the right pleural effusion. Probably stable atelectasis at each lung base. Tubes and lines grossly stable in position. Electronically Signed   By: Bary Richard M.D.   On: 04/14/2015 11:03   Dg Chest Port 1 View  04/12/2015  CLINICAL DATA:  Endotracheal tube placement. EXAM: PORTABLE CHEST 1 VIEW COMPARISON:  April 11, 2015. FINDINGS: Stable cardiomegaly. Status post coronary artery bypass graft. Patient is rotated to the right. Mild central pulmonary vascular congestion is noted. Moderate right pleural effusion is again noted with probable underlying atelectasis or infiltrate. No pneumothorax  is noted. Endotracheal tube is seen projected over tracheal air shadow with distal tip at least 2 cm above the carina. Nasogastric tube is seen entering the stomach. IMPRESSION: Endotracheal tube appears to be in grossly good position. Stable cardiomegaly and central pulmonary vascular congestion is noted. Stable moderate right pleural effusion with probable underlying atelectasis or infiltrate. Electronically Signed   By: Lupita RaiderJames  Green Jr, M.D.   On: 04/12/2015 11:04   Dg Chest Portable 1 View  04/11/2015  CLINICAL DATA:  Hypoxia.  Cough. EXAM: PORTABLE CHEST 1 VIEW COMPARISON:  February 07, 2015. FINDINGS: Stable cardiomegaly. Status post coronary artery bypass graft. No pneumothorax is noted. Mild central pulmonary vascular congestion is again noted and stable. Mild left pleural effusion is noted. Moderate right pleural effusion is noted which is increased compared to prior exam, with probable underlying atelectasis or infiltrate. Bony thorax is  unremarkable. IMPRESSION: Stable mild left pleural effusion. Significantly increased moderate right pleural effusion with probable underlying atelectasis or infiltrate. Electronically Signed   By: Lupita RaiderJames  Green Jr, M.D.   On: 04/11/2015 18:31   Dg Chest Port 1v Same Day  04/16/2015  CLINICAL DATA:  Check OG placement EXAM: PORTABLE CHEST - 1 VIEW SAME DAY COMPARISON:  04/16/2015 FINDINGS: Endotracheal tube is again identified in satisfactory position. A right-sided PICC line is noted at cavoatrial junction. A orogastric catheter is seen extending into the stomach. Bilateral pleural effusions and bibasilar atelectatic changes are again seen. No acute bony abnormality is noted. IMPRESSION: Tubes and lines as described in satisfactory position. Stable bibasilar changes. Electronically Signed   By: Alcide CleverMark  Lukens M.D.   On: 04/16/2015 20:36   [place] Thoracentesis Asp Pleural Space W/img Guide  04/19/2015  CLINICAL DATA:  RIGHT pleural effusion, RIGHT lower lobe atelectasis, high oxygen requirements, on ventilator EXAM: ULTRASOUND GUIDED DIAGNOSTIC AND THERAPEUTIC RIGHT THORACENTESIS COMPARISON:  Chest CT 04/17/2014 PROCEDURE: Procedure, benefits, and risks of procedure were discussed with patient's wife. Written informed consent for procedure was obtained. Time out protocol followed. Procedure performed portably in ICU. Patient placed in an LPO projection. Pleural effusion localized by ultrasound at the lateral RIGHT hemithorax. Skin prepped and draped in usual sterile fashion. Skin and soft tissues anesthetized with 10 mL of 1% lidocaine. Under direct sonographic visualization, 5 [place] catheter placed into the RIGHT pleural space. 270 mL of yellow RIGHT pleural fluid was aspirated by syringe. Due to limited positioning of patient for access of fluid, was unable to aspirate additional fluid. Procedure tolerated well by patient without immediate complication. COMPLICATIONS: None. FINDINGS: A total of approximately  270 mL of RIGHT pleural fluid was removed. A fluid sample of 180 mL was sent for laboratory analysis. IMPRESSION: Successful ultrasound guided RIGHT thoracentesis yielding 270 mL of pleural fluid. Electronically Signed   By: Ulyses SouthwardMark  Boles M.D.   On: 04/19/2015 14:42      ASSESSMENT AND PLAN: 1. Tachycardia/arrhythmia: Occurred in setting of marked hypernatremia and uremia with hypokalemia. Appears to be sinus tachycardia (p waves apparent). Diuretics have been stopped. I will start metoprolol 25 mg bid (takes 100 mg q am and 50 mg q pm as outpatient).  2. Acute on chronic diastolic heart failure: Euvolemic. Diuretics stopped. Markedly uremic and hypernatremic. Being given fluids for hypernatremia/uremia.  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 LVEF stable.  4. Acute hypoxic respiratory failure/CAP and COPD exacerbation: Now extubated. Management per IM and pulmonary.   Prentice DockerSuresh Raschelle Wisenbaker, M.D., F.A.C.C.

## 2015-04-24 NOTE — Progress Notes (Signed)
[facility] PROGRESS NOTE  Miguel GriceMarvin C Mendoza ZOX:[id number] DOB: 1940-11-23 DOA: 04/11/2015 PCP: [name],ASHISH, MD  Assessment/Plan: Acute hypoxemic and hypercarbic respiratory failure -Etiology likely multifactorial: COPD, right greater than left pleural effusion, CHF, pneumonia. -Was intubated 12/16 due to progression of respiratory failure on BiPAP. Extubated 12/27. -Had right-sided thoracentesis with 270 mL of parapneumonic effusion removed. -Appreciated pulmonary following.  Right greater than left pleural effusion -Status post thoracentesis on 12/23 with 370 mL of fluid removed that later was identified as parapneumonic effusion.  Community-acquired pneumonia -Has been on antibiotics for over 12 days. -We'll discontinue Rocephin and azithromycin today. -All culture data remains negative to date.  COPD with acute exacerbation -Continue steroids/nebs. -We'll start titrating steroids today, decrease prednisone from 60-40 mg.  Diabetes mellitus -We'll start sliding scale today. -Moved to start basal insulin given ongoing steroids and diet initiation.  Nutrition -Start diet today, consider speech evaluation if difficulties.  Acute on chronic diastolic CHF -2-D echo: EF 55-60%, grade 1 diastolic dysfunction. -Appears euvolemic to hypovolemic. -Lasix has been discontinued.  Hypernatremia -Has been started on D5 fluids, Lasix discontinued. -We'll check an a.m.  History of coronary artery disease -Appears stable at this venue.  Elevated troponin -Mild elevation to 0.05. Likely represents demand ischemia given complexity of overall illness and respiratory failure. -2-D echo: Ejection fraction of 55-60%, normal wall motion, grade 1 diastolic dysfunction. -No further workup planned for at present.  Acute renal failure -Improving. -Related to acute illness, ATN. -Monitor renal function.  Tachycardia arrhythmia -Unclear if A. fib, SVT. -Cardiology on board,  Cardizem drip was titrated off this morning as rates have been stable in the 70s to 80s.   Code Status: DO NOT RESUSCITATE Family Communication: Patient only  Disposition Plan: Keep in ICU today, start physical therapy.   Consultants:  Cardiology  Pulmonology   Antibiotics:  None   Subjective: Awake, can answer simple questions, appears very weak  Objective: Filed Vitals:   04/24/15 0746 04/24/15 0800 04/24/15 0845 04/24/15 0900  BP:  136/72  142/84  Pulse:  78  81  Temp:   98.3 F (36.8 C)   TempSrc:   Axillary   Resp:  18  18  Height:      Weight:      SpO2: 93% 96%  94%    Intake/Output Summary (Last 24 hours) at 04/24/15 1013 Last data filed at 04/24/15 0800  Gross per 24 hour  Intake 791.42 ml  Output   1600 ml  Net -808.58 ml   Filed Weights   04/22/15 0500 04/23/15 0500 04/24/15 0500  Weight: 76 kg (167 lb 8.8 oz) 75.3 kg (166 lb 0.1 oz) 73.5 kg (162 lb 0.6 oz)    Exam:   General:  Awake, alert  Cardiovascular: Regular rate and rhythm  Respiratory: Clear to auscultation bilaterally  Abdomen: Soft, nontender, nondistended, positive bowel sounds  Extremities: No clubbing, cyanosis or edema   Neurologic:  Moves all 4 spontaneously  Data Reviewed: Basic Metabolic Panel:  Recent Labs Lab 04/18/15 0444 04/21/15 0842 04/22/15 0450 04/23/15 2210 04/24/15 0430  NA  --  147* 149* 155* 158*  K  --  2.8* 3.4* 3.7 3.4*  CL  --  107 106 115* 117*  CO2  --  32 31 28 29   GLUCOSE  --  146* 171* 218* 219*  BUN  --  104* 118* 128* 142*  CREATININE 1.28* 1.26* 1.35* 1.58* 1.39*  CALCIUM  --  9.5 9.9 10.4* 10.2  MG  --   --   --  2.9*  --    Liver Function Tests:  Recent Labs Lab 04/21/15 0842  AST 21  ALT 14*  ALKPHOS 50  BILITOT 0.8  PROT 6.8  ALBUMIN 3.3*   No results for input(s): LIPASE, AMYLASE in the last 168 hours. No results for input(s): AMMONIA in the last 168 hours. CBC:  Recent Labs Lab 04/21/15 0842  WBC 9.8    NEUTROABS 8.0*  HGB 13.1  HCT 45.9  MCV 90.0  PLT 171   Cardiac Enzymes: No results for input(s): CKTOTAL, CKMB, CKMBINDEX, TROPONINI in the last 168 hours. BNP (last 3 results)  Recent Labs  04/11/15 1812 04/12/15 0449  BNP 422.0* 520.0*    ProBNP (last 3 results) No results for input(s): PROBNP in the last 8760 hours.  CBG:  Recent Labs Lab 04/22/15 1938 04/23/15 0005 04/23/15 0345 04/23/15 0732 04/23/15 1125  GLUCAP 184* 239* 173* 171* 232*    Recent Results (from the past 240 hour(s))  Culture, respiratory (NON-Expectorated)     Status: None   Collection Time: 04/14/15  9:32 PM  Result Value Ref Range Status   Specimen Description TRACHEAL ASPIRATE  Final   Special Requests NONE  Final   Gram Stain   Final    MODERATE WBC PRESENT, PREDOMINANTLY PMN NO SQUAMOUS EPITHELIAL CELLS SEEN MODERATE GRAM POSITIVE RODS Performed at [facility]    Culture   Final    NORMAL OROPHARYNGEAL FLORA Performed at [facility]    Report Status 04/17/2015 FINAL  Final  Culture, expectorated sputum-assessment     Status: None   Collection Time: 04/14/15  9:48 PM  Result Value Ref Range Status   Specimen Description ENDOTRACHEAL  Final   Special Requests NONE  Final   Sputum evaluation THIS SPECIMEN IS ACCEPTABLE FOR SPUTUM CULTURE  Final   Report Status 04/14/2015 FINAL  Final  Culture, body fluid-bottle     Status: None (Preliminary result)   Collection Time: 04/19/15 12:22 PM  Result Value Ref Range Status   Specimen Description PLEURAL  Final   Special Requests NONE  Final   Culture   Final    NO GROWTH 3 DAYS Performed at [facility]    Report Status PENDING  Incomplete  Gram stain     Status: None   Collection Time: 04/19/15 12:22 PM  Result Value Ref Range Status   Specimen Description PLEURAL  Final   Special Requests NONE  Final   Gram Stain   Final    CYTOSPIN SMEAR WBC PRESENT, PREDOMINANTLY MONONUCLEAR NO ORGANISMS  SEEN Performed at [facility]    Report Status 04/20/2015 FINAL  Final  Culture, body fluid-bottle     Status: None   Collection Time: 04/19/15  2:30 PM  Result Value Ref Range Status   Specimen Description PLEURAL  Final   Special Requests NONE  Final   Culture NO GROWTH 5 DAYS  Final   Report Status 04/24/2015 FINAL  Final     Studies: No results found.  Scheduled Meds: . antiseptic oral rinse  7 mL Mouth Rinse QID  . azithromycin  500 mg Intravenous Q24H  . cefTRIAXone (ROCEPHIN)  IV  1 g Intravenous Q24H  . chlorhexidine gluconate  15 mL Mouth Rinse BID  . diltiazem  10 mg Intravenous Once  . ipratropium-albuterol  3 mL Nebulization Q6H  . metoprolol  2.5 mg Intravenous 4 times per day  . nystatin-triamcinolone  Topical QID  . pantoprazole (PROTONIX) IV  40 mg Intravenous Daily  . potassium chloride  40 mEq Oral Daily  . predniSONE  60 mg Oral Q breakfast  . sodium chloride  3 mL Intravenous Q12H   Continuous Infusions: . dextrose 100 mL/hr at 04/24/15 0800  . diltiazem (CARDIZEM) infusion 2.5 mg/hr (04/24/15 0730)    Principal Problem:   Ventilator dependence (HCC) Active Problems:   Acute respiratory failure with hypercapnia (HCC)   Diabetes (HCC)   TOBACCO USER   Essential hypertension   CORONARY ATHEROSCLEROSIS NATIVE CORONARY ARTERY   Pleural effusion, right   COPD with acute exacerbation (HCC)   Diastolic CHF, acute on chronic (HCC)   CAP (community acquired pneumonia)   Acute on chronic diastolic CHF (congestive heart failure), NYHA class 1 (HCC)    Time spent: 25 minutes. Greater than 50% of this time was spent in direct contact with the patient coordinating care.    Chaya Jan  [facility] Pager [phone]  If 7PM-7AM, please contact night-coverage at [email or web address], password [facility] 04/24/2015, 10:13 AM  LOS: 13 days

## 2015-04-25 ENCOUNTER — Inpatient Hospital Stay (HOSPITAL_COMMUNITY): Payer: Medicare HMO

## 2015-04-25 DIAGNOSIS — I25810 Atherosclerosis of coronary artery bypass graft(s) without angina pectoris: Secondary | ICD-10-CM

## 2015-04-25 LAB — CBC
HCT: 50.6 % (ref 39.0–52.0)
HEMOGLOBIN: 14.2 g/dL (ref 13.0–17.0)
MCH: 25.9 pg — ABNORMAL LOW (ref 26.0–34.0)
MCHC: 28.1 g/dL — AB (ref 30.0–36.0)
MCV: 92.2 fL (ref 78.0–100.0)
PLATELETS: 128 10*3/uL — AB (ref 150–400)
RBC: 5.49 MIL/uL (ref 4.22–5.81)
RDW: 18.6 % — ABNORMAL HIGH (ref 11.5–15.5)
WBC: 12.9 10*3/uL — ABNORMAL HIGH (ref 4.0–10.5)

## 2015-04-25 LAB — GLUCOSE, CAPILLARY
GLUCOSE-CAPILLARY: 161 mg/dL — AB (ref 65–99)
GLUCOSE-CAPILLARY: 164 mg/dL — AB (ref 65–99)
Glucose-Capillary: 154 mg/dL — ABNORMAL HIGH (ref 65–99)
Glucose-Capillary: 186 mg/dL — ABNORMAL HIGH (ref 65–99)

## 2015-04-25 LAB — BLOOD GAS, ARTERIAL
Acid-Base Excess: 2.1 mmol/L — ABNORMAL HIGH (ref 0.0–2.0)
BICARBONATE: 24.5 meq/L — AB (ref 20.0–24.0)
Drawn by: 25788
FIO2: 45
MODE: POSITIVE
O2 SAT: 88.7 %
PEEP: 5 cmH2O
PH ART: 7.279 — AB (ref 7.350–7.450)
Pressure support: 5 cmH2O
pCO2 arterial: 62.1 mmHg (ref 35.0–45.0)
pO2, Arterial: 64.9 mmHg — ABNORMAL LOW (ref 80.0–100.0)

## 2015-04-25 LAB — BASIC METABOLIC PANEL
Anion gap: 9 (ref 5–15)
BUN: 104 mg/dL — ABNORMAL HIGH (ref 6–20)
CALCIUM: 10.2 mg/dL (ref 8.9–10.3)
CHLORIDE: 119 mmol/L — AB (ref 101–111)
CO2: 24 mmol/L (ref 22–32)
CREATININE: 1.19 mg/dL (ref 0.61–1.24)
GFR, EST NON AFRICAN AMERICAN: 59 mL/min — AB (ref >60)
Glucose, Bld: 209 mg/dL — ABNORMAL HIGH (ref 65–99)
Potassium: 4 mmol/L (ref 3.5–5.1)
SODIUM: 152 mmol/L — AB (ref 135–145)

## 2015-04-25 LAB — CULTURE, BODY FLUID W GRAM STAIN -BOTTLE: Culture: NO GROWTH

## 2015-04-25 LAB — CULTURE, BODY FLUID-BOTTLE

## 2015-04-25 MED ORDER — METOPROLOL TARTRATE 25 MG PO TABS
25.0000 mg | ORAL_TABLET | Freq: Three times a day (TID) | ORAL | Status: DC
  Administered 2015-04-25 – 2015-04-26 (×4): 25 mg via ORAL
  Filled 2015-04-25 (×3): qty 1

## 2015-04-25 MED ORDER — INSULIN GLARGINE 100 UNIT/ML ~~LOC~~ SOLN
5.0000 [IU] | Freq: Every day | SUBCUTANEOUS | Status: DC
  Administered 2015-04-25: 5 [IU] via SUBCUTANEOUS
  Filled 2015-04-25 (×4): qty 0.05

## 2015-04-25 MED ORDER — PREDNISONE 20 MG PO TABS
20.0000 mg | ORAL_TABLET | Freq: Every day | ORAL | Status: DC
  Administered 2015-04-26: 20 mg via ORAL
  Filled 2015-04-25 (×2): qty 1

## 2015-04-25 NOTE — Progress Notes (Signed)
Called report to [place], Charity fundraiserN on dept 300. Verbalized understanding. Pt transferred to room 309 in safe and stable condition.

## 2015-04-25 NOTE — Care Management Note (Signed)
Case Management Note  Patient Details  Name: York GriceMarvin C Nuccio MRN: [id number] Date of Birth: 07/12/1940  Expected Discharge Date:  04/15/15               Expected Discharge Plan:  Skilled Nursing Facility  In-House Referral:  Clinical Social Work  Discharge planning Services  CM Consult  Post Acute Care Choice:  NA Choice offered to:  NA  DME Arranged:    DME Agency:     HH Arranged:    HH Agency:     Status of Service:  Completed, signed off  Medicare Important Message Given:  Yes Date Medicare IM Given:    Medicare IM give by:    Date Additional Medicare IM Given:    Additional Medicare Important Message give by:     If discussed at Long Length of Stay Meetings, dates discussed:  04/26/2015  Additional Comments: PT has recommended SNF at [place]. Pt/family agreeable to SNF. CSW is aware and working with pt/family to arrange for placement at [place]. No further CM needs anticipated.  Malcolm Metrohildress, Lilyrose Tanney Demske, RN 04/25/2015, 2:07 PM

## 2015-04-25 NOTE — Progress Notes (Signed)
[facility] PROGRESS NOTE  Miguel Mendoza ZOX:[id number] DOB: 15-Feb-1941 DOA: 04/11/2015 PCP: [name], MD  Assessment/Plan: Acute hypoxemic and hypercarbic respiratory failure -Etiology likely multifactorial: COPD, right greater than left pleural effusion, CHF, pneumonia. -Was intubated 12/16 due to progression of respiratory failure on BiPAP. Extubated 12/27. -Had right-sided thoracentesis with 270 mL of parapneumonic effusion removed. -Appreciate pulmonary following.  Right greater than left pleural effusion -Status post thoracentesis on 12/23 with 270 mL of fluid removed that later was identified as parapneumonic effusion.  Community-acquired pneumonia -Has been on antibiotics for over 12 days. -Currently off antibiotics. -All culture data remains negative to date.  COPD with acute exacerbation -Continue steroids/nebs. -We'll start titrating steroids today, decrease prednisone from 40-20 mg.  Diabetes mellitus -We'll start sliding scale today. -Moved to start basal insulin given ongoing steroids and diet initiation.  Nutrition -Start diet today, consider speech evaluation if difficulties.  Acute on chronic diastolic CHF -2-D echo: EF 55-60%, grade 1 diastolic dysfunction. -Appears euvolemic to hypovolemic. -Lasix has been discontinued.  Hypernatremia -Has been started on D5 fluids, Lasix discontinued. -Na improved to 152 today. -Continue IVF.  History of coronary artery disease -Appears stable at this venue.  Elevated troponin -Mild elevation to 0.05. Likely represents demand ischemia given complexity of overall illness and respiratory failure. -2-D echo: Ejection fraction of 55-60%, normal wall motion, grade 1 diastolic dysfunction. -No further workup planned for at present.  Acute renal failure -Improving. -Related to acute illness, ATN. -Monitor renal function.  Tachycardia arrhythmia -Unclear if A. fib, SVT. -Cardiology on board,  Cardizem drip was titrated off this morning as rates have been stable in the 70s to 80s. -On metoprolol. Cards planning on dose increase today.  Sacral/Buttock Excoriation -Continue zinc oxide/antifungal cream. Turn frequently. -Keep foley and rectal tube in place to avoid further excoriation.   Code Status: DO NOT RESUSCITATE Family Communication: Patient only  Disposition Plan: Transfer to floor. PT recommends SNF. SW aware.   Consultants:  Cardiology  Pulmonology   Antibiotics:  None   Subjective: Awake, can answer simple questions, appears very weak  Objective: Filed Vitals:   04/25/15 0300 04/25/15 0325 04/25/15 0400 04/25/15 0500  BP:  165/85 158/82 177/84  Pulse: 88 89 87 91  Temp:   97.5 F (36.4 C)   TempSrc:   Axillary   Resp: Height:      Weight:    73.6 kg (162 lb 4.1 oz)  SpO2: 93% 95% 96% 92%    Intake/Output Summary (Last 24 hours) at 04/25/15 0855 Last data filed at 04/25/15 0500  Gross per 24 hour  Intake 2271.67 ml  Output    850 ml  Net 1421.67 ml   Filed Weights   04/23/15 0500 04/24/15 0500 04/25/15 0500  Weight: 75.3 kg (166 lb 0.1 oz) 73.5 kg (162 lb 0.6 oz) 73.6 kg (162 lb 4.1 oz)    Exam:   General:  Awake, alert  Cardiovascular: Regular rate and rhythm  Respiratory: Clear to auscultation bilaterally  Abdomen: Soft, nontender, nondistended, positive bowel sounds  Extremities: No clubbing, cyanosis or edema   Neurologic:  Moves all 4 spontaneously  Data Reviewed: Basic Metabolic Panel:  Recent Labs Lab 04/21/15 0842 04/22/15 0450 04/23/15 2210 04/24/15 0430 04/24/15 1138 04/24/15 1649 04/24/15 2248 04/25/15 0550  NA 147* 149* 155* 158* 156* 157* 154* 152*  K 2.8* 3.4* 3.7 3.4*  --   --   --  4.0  CL 107 106  115* 117*  --   --   --  119*  CO2 32 31 28 29   --   --   --  24  GLUCOSE 146* 171* 218* 219*  --   --   --  209*  BUN 104* 118* 128* 142*  --   --   --  104*  CREATININE 1.26* 1.35* 1.58*  1.39*  --   --   --  1.19  CALCIUM 9.5 9.9 10.4* 10.2  --   --   --  10.2  MG  --   --  2.9*  --   --   --   --   --    Liver Function Tests:  Recent Labs Lab 04/21/15 0842  AST 21  ALT 14*  ALKPHOS 50  BILITOT 0.8  PROT 6.8  ALBUMIN 3.3*   No results for input(s): LIPASE, AMYLASE in the last 168 hours. No results for input(s): AMMONIA in the last 168 hours. CBC:  Recent Labs Lab 04/21/15 0842 04/25/15 0550  WBC 9.8 12.9*  NEUTROABS 8.0*  --   HGB 13.1 14.2  HCT 45.9 50.6  MCV 90.0 92.2  PLT 171 128*   Cardiac Enzymes: No results for input(s): CKTOTAL, CKMB, CKMBINDEX, TROPONINI in the last 168 hours. BNP (last 3 results)  Recent Labs  04/11/15 1812 04/12/15 0449  BNP 422.0* 520.0*    ProBNP (last 3 results) No results for input(s): PROBNP in the last 8760 hours.  CBG:  Recent Labs Lab 04/23/15 1125 04/24/15 1115 04/24/15 1724 04/24/15 2146 04/25/15 0743  GLUCAP 232* 188* 181* 163* 161*    Recent Results (from the past 240 hour(s))  Culture, body fluid-bottle     Status: None (Preliminary result)   Collection Time: 04/19/15 12:22 PM  Result Value Ref Range Status   Specimen Description PLEURAL  Final   Special Requests NONE  Final   Culture   Final    NO GROWTH 4 DAYS [facility]    Report Status PENDING  Incomplete  Gram stain     Status: None   Collection Time: 04/19/15 12:22 PM  Result Value Ref Range Status   Specimen Description PLEURAL  Final   Special Requests NONE  Final   Gram Stain   Final    CYTOSPIN SMEAR WBC PRESENT, PREDOMINANTLY MONONUCLEAR NO ORGANISMS SEEN Performed at [facility]    Report Status 04/20/2015 FINAL  Final  Culture, body fluid-bottle     Status: None   Collection Time: 04/19/15  2:30 PM  Result Value Ref Range Status   Specimen Description PLEURAL  Final   Special Requests NONE  Final   Culture NO GROWTH 5 DAYS  Final   Report Status 04/24/2015 FINAL  Final      Studies: No results found.  Scheduled Meds: . antiseptic oral rinse  7 mL Mouth Rinse BID  . insulin aspart  0-15 Units Subcutaneous TID WC  . insulin aspart  0-5 Units Subcutaneous QHS  . insulin aspart  4 Units Subcutaneous TID WC  . ipratropium-albuterol  3 mL Nebulization Q6H  . metoprolol tartrate  25 mg Oral TID  . nystatin-triamcinolone   Topical QID  . pantoprazole (PROTONIX) IV  40 mg Intravenous Daily  . potassium chloride  40 mEq Oral Daily  . predniSONE  40 mg Oral Q breakfast  . sodium chloride  3 mL Intravenous Q12H   Continuous Infusions: . 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 Greater than 50% of this time was spent in direct contact with the patient coordinating care.    Chaya Jan  [facility] Pager [phone]  If 7PM-7AM, please contact night-coverage at [email or web address], password [facility] 04/25/2015, 8:55 AM  LOS: 14 days

## 2015-04-25 NOTE — NC FL2 (Signed)
[place] MEDICAID FL2 LEVEL OF CARE SCREENING TOOL     IDENTIFICATION  Patient Name: Miguel Mendoza Birthdate: 04-28-1940 Sex: male Admission Date (Current Location): 04/11/2015  [facility] and [place] Number:  [employer] and Address:  [facility],  [address] [place] [address]      Provider Number: [id number]  Attending Physician Name and Address:  Micael Hampshire Acost*  Relative Name and Phone Number:  Croy Drumwright, wife, [phone]    Current Level of Care: Hospital Recommended Level of Care: Skilled Nursing Facility Prior Approval Number:    Date Approved/Denied:   PASRR Number: [id number] ([id number])  Discharge Plan: SNF    Current Diagnoses: Patient Active Problem List   Diagnosis Date Noted  . Status post thoracentesis   . Hyperosmolality and/or hypernatremia   . Uremia   . Tachycardia   . 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 Acute on chronic diastolic CHF (congestive heart failure), NYHA class 1 (HCC)   . Ventilator dependence (HCC) 04/12/2015  . Acute respiratory failure with hypercapnia (HCC) 04/12/2015  . COPD with acute exacerbation (HCC) 04/12/2015  . Diastolic CHF, acute on chronic (HCC) 04/12/2015  . CAP (community acquired pneumonia) 04/12/2015  . Pleural effusion, right 04/11/2015  . Weight loss, abnormal 10/16/2011  . TOBACCO USER 06/03/2009  . AAA 06/03/2009  . Diabetes (HCC) 02/27/2009  . PURE HYPERCHOLESTEROLEMIA 02/27/2009  . MIGRAINE UNSP W/O INTRACT W/O STATUS MIGRAINOSUS 02/27/2009  . Essential hypertension 02/27/2009  . CORONARY ATHEROSCLEROSIS NATIVE CORONARY ARTERY 02/27/2009  . VENTRICULAR FIBRILLATION 02/27/2009  . SYNCOPE 02/27/2009  . CHEST PAIN UNSPECIFIED 02/27/2009  . 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  BEHAVIORAL SYMPTOMS/MOOD NEUROLOGICAL BOWEL NUTRITION STATUS      Continent Diet (Diet heart healthy/carb modified)  AMBULATORY STATUS COMMUNICATION OF NEEDS Skin     Verbally Normal                       Personal Care Assistance Level of Assistance  Bathing, Dressing, Feeding Bathing Assistance: Limited assistance Feeding assistance: Limited assistance Dressing Assistance: Limited assistance     Functional Limitations Info             SPECIAL CARE FACTORS FREQUENCY  PT (By licensed PT)     PT Frequency:  (min. 3x/week)              Contractures      Additional Factors Info  Code Status, Allergies (DNR) Code Status Info:  (DNR) Allergies Info: Ibuprofen (Ibuprofen)           Current Medications (04/25/2015):  This is the current hospital active medication list Current Facility-Administered Medications  Medication Dose Route Frequency Provider Last Rate Last Dose  . antiseptic oral rinse (CPC / CETYLPYRIDINIUM CHLORIDE 0.05%) solution 7 mL  7 mL Mouth Rinse BID Henderson Cloud, MD   7 mL at 04/25/15 1000  . dextrose 5 % solution   Intravenous Continuous Leda Gauze, NP 100 mL/hr at 04/25/15 1358    . insulin aspart (novoLOG) injection 0-15 Units  0-15 Units Subcutaneous TID WC Estela Isaiah Blakes, MD   3 Units at 04/25/15 1206  . insulin aspart (novoLOG) injection 0-5 Units  0-5 Units Subcutaneous  QHS Henderson CloudEstela Y Hernandez Acosta, MD   0 Units at 04/24/15 2200  . insulin aspart (novoLOG) injection 4 Units  4 Units Subcutaneous TID WC Estela Isaiah BlakesY Hernandez Acosta, MD   4 Units at 04/25/15 1206  . insulin glargine (LANTUS) injection 5 Units  5 Units Subcutaneous QHS Estela Isaiah BlakesY Hernandez Acosta, MD      . ipratropium-albuterol (DUONEB) 0.5-2.5 (3) MG/3ML nebulizer solution 3 mL  3 mL Nebulization Q6H Estela Isaiah BlakesY Hernandez Acosta, MD   3 mL at 04/25/15 1430  . metoprolol tartrate (LOPRESSOR) tablet 25 mg  25  mg Oral TID Jodelle GrossKathryn M Lawrence, NP   25 mg at 04/25/15 0949  . nitroGLYCERIN (NITROSTAT) SL tablet 0.4 mg  0.4 mg Sublingual Q5 min PRN Nimish Normajean Glasgow Gosrani, MD      . nystatin-triamcinolone (MYCOLOG II) cream   Topical QID Houston SirenPeter Le, MD      . ondansetron [facility](ZOFRAN) tablet 4 mg  4 mg Oral Q6H PRN Nimish Normajean Glasgow Gosrani, MD       Or  . ondansetron (ZOFRAN) injection 4 mg  4 mg Intravenous Q6H PRN Nimish C Gosrani, MD      . pantoprazole (PROTONIX) injection 40 mg  40 mg Intravenous Daily Henderson CloudEstela Y Hernandez Acosta, MD   40 mg at 04/25/15 0949  . potassium chloride SA (K-DUR,KLOR-CON) CR tablet 40 mEq  40 mEq Oral Daily Henderson CloudEstela Y Hernandez Acosta, MD   40 mEq at 04/25/15 0949  . [START ON 04/26/2015] predniSONE (DELTASONE) tablet 20 mg  20 mg Oral Q breakfast Estela Isaiah BlakesY Hernandez Acosta, MD      . sodium chloride 0.9 % injection 3 mL  3 mL Intravenous Q12H Chamblee SingerNimish C Gosrani, MD   3 mL at 04/25/15 [id number]     Discharge Medications: Please see discharge summary for a list of discharge medications.  Relevant Imaging Results:  Relevant Lab Results:   Additional Information    Savalas Monje, Juleen ChinaHeather D, LCSW

## 2015-04-25 NOTE — Clinical Social Work Note (Signed)
Clinical Social Work Assessment  Patient Details  Name: Miguel Mendoza MRN: [id number] Date of Birth: [birth year]  Date of referral:  04/25/15               Reason for consult:  Facility Placement                Permission sought to share information with:    Permission granted to share information::     Name::        Agency::     Relationship::     Contact Information:     Housing/Transportation Living arrangements for the past 2 months:  [place] of Information:  Spouse, Adult Children Patient Interpreter Needed:  None Criminal Activity/Legal Involvement Pertinent to Current Situation/Hospitalization:  No - Comment as needed Significant Relationships:  Adult Children, Spouse, Other Family Members Lives with:  Spouse Do you feel safe going back to the place where you live?  No Need for family participation in patient care:  Yes (Comment)  Care giving concerns:  Lives at home with his wife, who assist him with his needs.   Social Worker assessment / plan:  CSW met with patient's wife, Miguel Mendoza, who was at bedside.  [name] advised that patient ambulates with a walker sometimes or she assist him sometimes. She stated that patient use to have a home health nurse, however that has stopped. She could not remember with [facility] services stopped. CSW discussed the SNF recommendation and provided a SNF list. Patient's wife chose [facility], [facility] and [facility].    Employment status:  Retired Nurse, adult PT Recommendations:  [place] / Referral to community resources:  [place]  Patient/Family's Response to care: Patient's wife is agreeable for short term rehab.   Patient/Family's Understanding of and Emotional Response to Diagnosis, Current Treatment, and Prognosis:  Wife understands patient's diagnosis, treatment and prognosis.    Emotional Assessment Appearance:   Appears stated age Attitude/Demeanor/Rapport:   (Client laughed throughout assessment and was confused. ) Affect (typically observed):  Calm Orientation:  Oriented to Self Alcohol / Substance use:  Not Applicable Psych involvement (Current and /or in the community):  No (Comment)  Discharge Needs  Concerns to be addressed:  Discharge Planning Concerns Readmission within the last 30 days:  No Current discharge risk:  None Barriers to Discharge:  No Barriers Identified   Ihor Gully, LCSW 04/25/2015, 2:13 PM [phone]

## 2015-04-25 NOTE — Progress Notes (Signed)
Subjective: He is on nasal oxygen and his O2 saturation is in the 90s. He says he feels fairly well. He has no new complaints.  Objective: Vital signs in last 24 hours: Temp:  [97.5 F (36.4 C)-98.4 F (36.9 C)] 97.5 F (36.4 C) (12/29 0400) Pulse Rate:  [76-91] 91 (12/29 0500) Resp:  [16-21] 20 (12/29 0500) BP: (128-177)/(74-102) 177/84 mmHg (12/29 0500) SpO2:  [92 %-99 %] 92 % (12/29 0500) Weight:  [73.6 kg (162 lb 4.1 oz)] 73.6 kg (162 lb 4.1 oz) (12/29 0500) Weight change: 0.1 kg (3.5 oz) Last BM Date: 04/24/15  Intake/Output from previous day: 12/28 0701 - 12/29 0700 In: 2446.7 [I.V.:2146.7; IV Piggyback:300] Out: 850 [Urine:850]  PHYSICAL EXAM General appearance: alert and mild distress Resp: rhonchi bilaterally Cardio: regular rate and rhythm, S1, S2 normal, no murmur, click, rub or gallop GI: soft, non-tender; bowel sounds normal; no masses,  no organomegaly Extremities: extremities normal, atraumatic, no cyanosis or edema  Lab Results:  Results for orders placed or performed during the hospital encounter of 04/11/15 (from the past 48 hour(s))  Glucose, capillary     Status: Abnormal   Collection Time: 04/23/15 11:25 AM  Result Value Ref Range   Glucose-Capillary 232 (H) 65 - 99 mg/dL   Comment 1 Notify RN   Basic metabolic panel     Status: Abnormal   Collection Time: 04/23/15 10:10 PM  Result Value Ref Range   Sodium 155 (H) 135 - 145 mmol/L   Potassium 3.7 3.5 - 5.1 mmol/L   Chloride 115 (H) 101 - 111 mmol/L   CO2 28 22 - 32 mmol/L   Glucose, Bld 218 (H) 65 - 99 mg/dL   BUN 128 (H) 6 - 20 mg/dL    Comment: RESULTS CONFIRMED BY MANUAL DILUTION   Creatinine, Ser 1.58 (H) 0.61 - 1.24 mg/dL   Calcium 10.4 (H) 8.9 - 10.3 mg/dL   GFR calc non Af Amer 42 (L) >60 mL/min   GFR calc Af Amer 48 (L) >60 mL/min    Comment: (NOTE) The eGFR has been calculated using the CKD EPI equation. This calculation has not been validated in all clinical situations. eGFR's  persistently <60 mL/min signify possible Chronic Kidney Disease.    Anion gap 12 5 - 15  Magnesium     Status: Abnormal   Collection Time: 04/23/15 10:10 PM  Result Value Ref Range   Magnesium 2.9 (H) 1.7 - 2.4 mg/dL  Basic metabolic panel     Status: Abnormal   Collection Time: 04/24/15  4:30 AM  Result Value Ref Range   Sodium 158 (H) 135 - 145 mmol/L   Potassium 3.4 (L) 3.5 - 5.1 mmol/L   Chloride 117 (H) 101 - 111 mmol/L   CO2 29 22 - 32 mmol/L   Glucose, Bld 219 (H) 65 - 99 mg/dL   BUN 142 (H) 6 - 20 mg/dL    Comment: RESULTS CONFIRMED BY MANUAL DILUTION   Creatinine, Ser 1.39 (H) 0.61 - 1.24 mg/dL   Calcium 10.2 8.9 - 10.3 mg/dL   GFR calc non Af Amer 49 (L) >60 mL/min   GFR calc Af Amer 56 (L) >60 mL/min    Comment: (NOTE) The eGFR has been calculated using the CKD EPI equation. This calculation has not been validated in all clinical situations. eGFR's persistently <60 mL/min signify possible Chronic Kidney Disease.    Anion gap 12 5 - 15  Glucose, capillary     Status: Abnormal   Collection  Time: 04/24/15 11:15 AM  Result Value Ref Range   Glucose-Capillary 188 (H) 65 - 99 mg/dL   Comment 1 Notify RN   Sodium     Status: Abnormal   Collection Time: 04/24/15 11:38 AM  Result Value Ref Range   Sodium 156 (H) 135 - 145 mmol/L  Sodium     Status: Abnormal   Collection Time: 04/24/15  4:49 PM  Result Value Ref Range   Sodium 157 (H) 135 - 145 mmol/L  Glucose, capillary     Status: Abnormal   Collection Time: 04/24/15  5:24 PM  Result Value Ref Range   Glucose-Capillary 181 (H) 65 - 99 mg/dL   Comment 1 Notify RN   Glucose, capillary     Status: Abnormal   Collection Time: 04/24/15  9:46 PM  Result Value Ref Range   Glucose-Capillary 163 (H) 65 - 99 mg/dL   Comment 1 Notify RN   Sodium     Status: Abnormal   Collection Time: 04/24/15 10:48 PM  Result Value Ref Range   Sodium 154 (H) 135 - 145 mmol/L  Basic metabolic panel     Status: Abnormal   Collection  Time: 04/25/15  5:50 AM  Result Value Ref Range   Sodium 152 (H) 135 - 145 mmol/L   Potassium 4.0 3.5 - 5.1 mmol/L   Chloride 119 (H) 101 - 111 mmol/L   CO2 24 22 - 32 mmol/L   Glucose, Bld 209 (H) 65 - 99 mg/dL   BUN 104 (H) 6 - 20 mg/dL    Comment: RESULTS CONFIRMED BY MANUAL DILUTION   Creatinine, Ser 1.19 0.61 - 1.24 mg/dL   Calcium 10.2 8.9 - 10.3 mg/dL   GFR calc non Af Amer 59 (L) >60 mL/min   GFR calc Af Amer >60 >60 mL/min    Comment: (NOTE) The eGFR has been calculated using the CKD EPI equation. This calculation has not been validated in all clinical situations. eGFR's persistently <60 mL/min signify possible Chronic Kidney Disease.    Anion gap 9 5 - 15  Glucose, capillary     Status: Abnormal   Collection Time: 04/25/15  7:43 AM  Result Value Ref Range   Glucose-Capillary 161 (H) 65 - 99 mg/dL   Comment 1 Notify RN    Comment 2 Document in Chart     ABGS  Recent Labs  04/23/15 0455  PHART 7.419  PO2ART 71.8*  TCO2 17.4  HCO3 26.3*   CULTURES Recent Results (from the past 240 hour(s))  Culture, body fluid-bottle     Status: None (Preliminary result)   Collection Time: 04/19/15 12:22 PM  Result Value Ref Range Status   Specimen Description PLEURAL  Final   Special Requests NONE  Final   Culture   Final    NO GROWTH 4 DAYS Performed at [facility]    Report Status PENDING  Incomplete  Gram stain     Status: None   Collection Time: 04/19/15 12:22 PM  Result Value Ref Range Status   Specimen Description PLEURAL  Final   Special Requests NONE  Final   Gram Stain   Final    CYTOSPIN SMEAR WBC PRESENT, PREDOMINANTLY MONONUCLEAR NO ORGANISMS SEEN Performed at [facility]    Report Status 04/20/2015 FINAL  Final  Culture, body fluid-bottle     Status: None   Collection Time: 04/19/15  2:30 PM  Result Value Ref Range Status   Specimen Description PLEURAL  Final  Special Requests NONE  Final   Culture NO GROWTH 5 DAYS  Final    Report Status 04/24/2015 FINAL  Final   Studies/Results: No results found.  Medications:  Prior to Admission:  Prescriptions prior to admission  Medication Sig Dispense Refill Last Dose  . amLODipine (NORVASC) 10 MG tablet Take 10 mg by mouth daily.   04/11/2015 at Unknown time  . atorvastatin (LIPITOR) 20 MG tablet Take 20 mg by mouth daily.   04/11/2015 at Unknown time  . clonazePAM (KLONOPIN) 1 MG tablet Take 1 mg by mouth 2 (two) times daily.   04/11/2015 at Unknown time  . clopidogrel (PLAVIX) 75 MG tablet Take 75 mg by mouth daily.   04/11/2015 at Unknown time  . esomeprazole (NEXIUM) 40 MG capsule Take 40 mg by mouth daily before breakfast.   04/11/2015 at Unknown time  . furosemide (LASIX) 40 MG tablet Take 0.5-1 tablets (20-40 mg total) by mouth every other day. Alternated a half pill with whole pill every other day (Patient taking differently: Take 40 mg by mouth daily. ) 115 tablet 3 unknown  . glimepiride (AMARYL) 2 MG tablet Take 2 mg by mouth daily.    04/11/2015 at Unknown time  . isosorbide mononitrate (IMDUR) 30 MG 24 hr tablet Take 30 mg by mouth daily.   04/11/2015 at Unknown time  . lisinopril (PRINIVIL,ZESTRIL) 2.5 MG tablet Take 2.5 mg by mouth daily.   04/11/2015 at Unknown time  . metFORMIN (GLUCOPHAGE) 500 MG tablet Take 1,000 mg by mouth 2 (two) times daily with a meal.   04/11/2015 at Unknown time  . metoprolol (LOPRESSOR) 100 MG tablet Take 100 mg by mouth every morning. & 1/2 by mouth in evening   04/11/2015 at Unknown time  . nitroGLYCERIN (NITROSTAT) 0.4 MG SL tablet Place 0.4 mg under the tongue every 5 (five) minutes as needed.   unknown  . oxyCODONE-acetaminophen (PERCOCET) 7.5-325 MG tablet Take 1 tablet by mouth every 4 (four) hours as needed for severe pain (4-6 hours).   unknown  . tiZANidine (ZANAFLEX) 4 MG tablet Take 4 mg by mouth 2 (two) times daily.    04/11/2015 at Unknown time  . traZODone (DESYREL) 100 MG tablet Take 100 mg by mouth at bedtime.    04/10/2015 at Unknown time  . gabapentin (NEURONTIN) 300 MG capsule Take 300 mg by mouth 3 (three) times daily.   Taking   Scheduled: . antiseptic oral rinse  7 mL Mouth Rinse BID  . insulin aspart  0-15 Units Subcutaneous TID WC  . insulin aspart  0-5 Units Subcutaneous QHS  . insulin aspart  4 Units Subcutaneous TID WC  . ipratropium-albuterol  3 mL Nebulization Q6H  . metoprolol tartrate  25 mg Oral BID  . nystatin-triamcinolone   Topical QID  . pantoprazole (PROTONIX) IV  40 mg Intravenous Daily  . potassium chloride  40 mEq Oral Daily  . predniSONE  40 mg Oral Q breakfast  . sodium chloride  3 mL Intravenous Q12H   Continuous: . dextrose 100 mL/hr at 04/25/15 0346   NOB:[id number], ondansetron **OR** ondansetron (ZOFRAN) IV  Assesment: He was admitted with acute hypercapnic respiratory failure and ended up requiring ventilator support but was able to come off the ventilator about 48 hours ago. He has remained off the ventilator and is on nasal oxygen and doing okay. He had acute on chronic diastolic heart failure and that has been treated and improved. He has acute on chronic renal failure which I  think was worse with his diuresis. He has community-acquired pneumonia which has been treated he had a thoracentesis which seemed to improve his situation and this appears to be a parapneumonic effusion Principal Problem:   Ventilator dependence ([place]) Active Problems:   Diabetes ([place])   TOBACCO USER   Essential hypertension   CORONARY ATHEROSCLEROSIS NATIVE CORONARY ARTERY   Pleural effusion, right   Acute respiratory failure with hypercapnia (HCC)   COPD with acute exacerbation (HCC)   Diastolic CHF, acute on chronic ([place])   CAP (community acquired pneumonia)   Acute on chronic diastolic CHF (congestive heart failure), NYHA class 1 ([place])   Status post thoracentesis   Hyperosmolality and/or hypernatremia   Uremia   Tachycardia   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 He does seem to be improving very slowly    LOS: 14 days   Demara Lover L 04/25/2015, 8:18 AM

## 2015-04-25 NOTE — Progress Notes (Signed)
Consulting cardiologist:Rosy Estabrook, Darliss RidgelSuresh MD Primary Cardiologist: Nona DellMcDowell, Samuel MD  Cardiology Specific Problem List: 1. CAD-Multivessel 2. Hx of CABG 3. Diastolic CHF 4. Hypertension 5. Atrial fib   Subjective:    Very weak. Verbally responsive but slurred speech.   Objective:   Temp:  [97.5 F (36.4 C)-98.4 F (36.9 C)] 97.5 F (36.4 C) (12/29 0400) Pulse Rate:  [76-91] 91 (12/29 0500) Resp:  [16-21] 20 (12/29 0500) BP: (128-177)/(74-102) 177/84 mmHg (12/29 0500) SpO2:  [92 %-99 %] 92 % (12/29 0500) Weight:  [162 lb 4.1 oz (73.6 kg)] 162 lb 4.1 oz (73.6 kg) (12/29 0500) Last BM Date: 04/24/15  Filed Weights   04/23/15 0500 04/24/15 0500 04/25/15 0500  Weight: 166 lb 0.1 oz (75.3 kg) 162 lb 0.6 oz (73.5 kg) 162 lb 4.1 oz (73.6 kg)    Intake/Output Summary (Last 24 hours) at 04/25/15 0806 Last data filed at 04/25/15 0500  Gross per 24 hour  Intake 2271.67 ml  Output    850 ml  Net 1421.67 ml    Telemetry: NSR rates in the 80s and 90'.    Exam:  General: No acute distress.   HEENT: Conjunctiva reddened, and lids normal, oropharynx clear.  Lungs: Clear to auscultation upper lobes, but crackles in the bases. Poor inspiratory effort.   Cardiac: No elevated JVP or bruits. RRR, no gallop or rub.   Abdomen: Normoactive bowel sounds, nontender, nondistended.  Extremities: No pitting edema, distal pulses full.  Neuropsychiatric: Alert and with uncertain orientation. Generalized profound weakness. Hand grips and foot resistance are extremely weak, but he responds when asked to squeeze or resist. Will not turn head to the right, causes pain when doing so.   Lab Results:  Basic Metabolic Panel:  Recent Labs Lab 04/23/15 2210 04/24/15 0430  04/24/15 1649 04/24/15 2248 04/25/15 0550  NA 155* 158*  < > 157* 154* 152*  K 3.7 3.4*  --   --   --  4.0  CL 115* 117*  --   --   --  119*  CO2 28 29  --   --   --  24  GLUCOSE 218* 219*  --   --   --  209*   BUN 128* 142*  --   --   --  104*  CREATININE 1.58* 1.39*  --   --   --  1.19  CALCIUM 10.4* 10.2  --   --   --  10.2  MG 2.9*  --   --   --   --   --   < > = values in this interval not displayed.  Liver Function Tests:  Recent Labs Lab 04/21/15 0842  AST 21  ALT 14*  ALKPHOS 50  BILITOT 0.8  PROT 6.8  ALBUMIN 3.3*    CBC:  Recent Labs Lab 04/21/15 0842  WBC 9.8  HGB 13.1  HCT 45.9  MCV 90.0  PLT 171     Medications:   Scheduled Medications: . antiseptic oral rinse  7 mL Mouth Rinse BID  . insulin aspart  0-15 Units Subcutaneous TID WC  . insulin aspart  0-5 Units Subcutaneous QHS  . insulin aspart  4 Units Subcutaneous TID WC  . ipratropium-albuterol  3 mL Nebulization Q6H  . metoprolol tartrate  25 mg Oral BID  . nystatin-triamcinolone   Topical QID  . pantoprazole (PROTONIX) IV  40 mg Intravenous Daily  . potassium chloride  40 mEq Oral Daily  . predniSONE  40 mg Oral Q breakfast  . sodium chloride  3 mL Intravenous Q12H    Infusions: . dextrose 100 mL/hr at 04/25/15 0346    PRN Medications: nitroGLYCERIN, ondansetron **OR** ondansetron (ZOFRAN) IV   Assessment and Plan:   1.Atrial fib: HR is NSR with rates up to 90's with stimulation. He continues on metoprolol 25 mg BID.  BP is elevated this am, but lower throughout the night. Will increase metoprolol to TID. No anticoagulation.   2. Hypernatremia: Continues on IV fluids.  3. Acute on Chronic Diastolic CHF: No longer on diuretics. HR   4.AKI: Creatinine is improving.   5. CAD: Stable no plans for ischemic testing.   Bettey Mare. Lawrence NP [facility]  04/25/2015, 8:06 AM   The patient was seen and examined, and I agree with the assessment and plan as documented above, with modifications as noted below. Pt looking and feeling better today as compared to yesterday. Na and BUN decreasing with IV fluids. Metoprolol increased to TID dosing today.   Prentice Docker, MD, [facility]  04/25/2015 9:57  AM

## 2015-04-26 ENCOUNTER — Inpatient Hospital Stay (HOSPITAL_COMMUNITY)
Admission: EM | Admit: 2015-04-26 | Discharge: 2015-04-28 | DRG: 189 | Disposition: A | Discharge status: Skilled Nursing Facility | Payer: Medicare HMO | Attending: Internal Medicine | Admitting: Internal Medicine

## 2015-04-26 ENCOUNTER — Other Ambulatory Visit: Payer: Self-pay

## 2015-04-26 ENCOUNTER — Encounter (HOSPITAL_COMMUNITY): Payer: Self-pay | Admitting: Emergency Medicine

## 2015-04-26 ENCOUNTER — Emergency Department (HOSPITAL_COMMUNITY): Payer: Medicare HMO

## 2015-04-26 DIAGNOSIS — Z8673 Personal history of transient ischemic attack (TIA), and cerebral infarction without residual deficits: Secondary | ICD-10-CM

## 2015-04-26 DIAGNOSIS — R4182 Altered mental status, unspecified: Secondary | ICD-10-CM | POA: Diagnosis not present

## 2015-04-26 DIAGNOSIS — Z951 Presence of aortocoronary bypass graft: Secondary | ICD-10-CM

## 2015-04-26 DIAGNOSIS — Z7984 Long term (current) use of oral hypoglycemic drugs: Secondary | ICD-10-CM

## 2015-04-26 DIAGNOSIS — E870 Hyperosmolality and hypernatremia: Secondary | ICD-10-CM | POA: Diagnosis present

## 2015-04-26 DIAGNOSIS — J9600 Acute respiratory failure, unspecified whether with hypoxia or hypercapnia: Secondary | ICD-10-CM

## 2015-04-26 DIAGNOSIS — J9622 Acute and chronic respiratory failure with hypercapnia: Secondary | ICD-10-CM | POA: Diagnosis not present

## 2015-04-26 DIAGNOSIS — E7800 Pure hypercholesterolemia, unspecified: Secondary | ICD-10-CM | POA: Diagnosis present

## 2015-04-26 DIAGNOSIS — I252 Old myocardial infarction: Secondary | ICD-10-CM

## 2015-04-26 DIAGNOSIS — Z8249 Family history of ischemic heart disease and other diseases of the circulatory system: Secondary | ICD-10-CM

## 2015-04-26 DIAGNOSIS — K219 Gastro-esophageal reflux disease without esophagitis: Secondary | ICD-10-CM | POA: Diagnosis present

## 2015-04-26 DIAGNOSIS — Y95 Nosocomial condition: Secondary | ICD-10-CM | POA: Diagnosis present

## 2015-04-26 DIAGNOSIS — E785 Hyperlipidemia, unspecified: Secondary | ICD-10-CM | POA: Diagnosis present

## 2015-04-26 DIAGNOSIS — F17210 Nicotine dependence, cigarettes, uncomplicated: Secondary | ICD-10-CM | POA: Diagnosis present

## 2015-04-26 DIAGNOSIS — J9621 Acute and chronic respiratory failure with hypoxia: Secondary | ICD-10-CM | POA: Diagnosis present

## 2015-04-26 DIAGNOSIS — I2510 Atherosclerotic heart disease of native coronary artery without angina pectoris: Secondary | ICD-10-CM | POA: Diagnosis present

## 2015-04-26 DIAGNOSIS — E860 Dehydration: Secondary | ICD-10-CM

## 2015-04-26 DIAGNOSIS — Z66 Do not resuscitate: Secondary | ICD-10-CM | POA: Diagnosis present

## 2015-04-26 DIAGNOSIS — Z7902 Long term (current) use of antithrombotics/antiplatelets: Secondary | ICD-10-CM

## 2015-04-26 DIAGNOSIS — I10 Essential (primary) hypertension: Secondary | ICD-10-CM | POA: Diagnosis present

## 2015-04-26 DIAGNOSIS — Z981 Arthrodesis status: Secondary | ICD-10-CM

## 2015-04-26 DIAGNOSIS — R4189 Other symptoms and signs involving cognitive functions and awareness: Secondary | ICD-10-CM

## 2015-04-26 DIAGNOSIS — G9340 Encephalopathy, unspecified: Secondary | ICD-10-CM | POA: Diagnosis present

## 2015-04-26 DIAGNOSIS — E119 Type 2 diabetes mellitus without complications: Secondary | ICD-10-CM | POA: Diagnosis present

## 2015-04-26 DIAGNOSIS — J440 Chronic obstructive pulmonary disease with acute lower respiratory infection: Secondary | ICD-10-CM | POA: Diagnosis present

## 2015-04-26 DIAGNOSIS — J189 Pneumonia, unspecified organism: Secondary | ICD-10-CM

## 2015-04-26 DIAGNOSIS — Z955 Presence of coronary angioplasty implant and graft: Secondary | ICD-10-CM

## 2015-04-26 DIAGNOSIS — J449 Chronic obstructive pulmonary disease, unspecified: Secondary | ICD-10-CM | POA: Diagnosis present

## 2015-04-26 LAB — LACTATE DEHYDROGENASE, PLEURAL OR PERITONEAL FLUID

## 2015-04-26 LAB — CBG MONITORING, ED: Glucose-Capillary: 111 mg/dL — ABNORMAL HIGH (ref 65–99)

## 2015-04-26 LAB — GLUCOSE, CAPILLARY
GLUCOSE-CAPILLARY: 162 mg/dL — AB (ref 65–99)
GLUCOSE-CAPILLARY: 195 mg/dL — AB (ref 65–99)

## 2015-04-26 MED ORDER — METOPROLOL TARTRATE 25 MG PO TABS: 25.0000 mg | ORAL_TABLET | Freq: Three times a day (TID) | ORAL | Status: DC

## 2015-04-26 MED ORDER — NYSTATIN-TRIAMCINOLONE 100000-0.1 UNIT/GM-% EX CREA: TOPICAL_CREAM | Freq: Four times a day (QID) | CUTANEOUS | Status: DC

## 2015-04-26 MED ORDER — IPRATROPIUM-ALBUTEROL 0.5-2.5 (3) MG/3ML IN SOLN
3.0000 mL | Freq: Once | RESPIRATORY_TRACT | Status: AC
  Administered 2015-04-26: 3 mL via RESPIRATORY_TRACT
  Filled 2015-04-26: qty 3

## 2015-04-26 NOTE — ED Notes (Signed)
MD at the bedside, pt has foley and rectal tube

## 2015-04-26 NOTE — Care Management Important Message (Signed)
Important Message  Patient Details  Name: Miguel Mendoza MRN: [id number] Date of Birth: 1941-02-14   Medicare Important Message Given:  Yes    [name], Kyerra Vargo Demske, RN 04/26/2015, 1:59 PM

## 2015-04-26 NOTE — Clinical Social Work Note (Signed)
CSW facilitated discharge.  [facility]  CSW notified patient's wife, Dionicia AblerGeraldine Limberg, that patient was being discharged.   [facility] [employer].  CSW signing off.   Jhonatan Lomeli, Juleen ChinaHeather D, LCSW

## 2015-04-26 NOTE — ED Notes (Signed)
[facility] reports from nurse that pt came in for [facility] , pt has not been assessed and noticed altered mental status, nurse not sure of pt baseline 150/72 HR100 RR20 per EMS, pt on cardiac monitor, will not responded to voice

## 2015-04-26 NOTE — ED Provider Notes (Signed)
CSN: [id number]     Arrival date & time 04/26/15  2250 History  By signing my name below, I, Lyndel Safe, attest that this documentation has been prepared under the direction and in the presence of Zadie Rhine, MD. Electronically Signed: Lyndel Safe, ED Scribe. 04/26/2015. 11:31 PM.   Chief Complaint  Patient presents with  . Altered Mental Status   Patient is a 74 y.o. male presenting with altered mental status. No language interpreter was used.  Altered Mental Status Severity:  Severe Most recent episode:  Today Timing:  Constant Context: nursing home resident, recent illness and recent infection    LEVEL 5 CAVEAT: ALTERED MENTAL STATUS  HPI Comments: GEAN LAROSE is a 74 y.o. male, with a h/o CAD, MI, CVA, DM II, CHF, and PE, brought in by ambulance, who presents to the Emergency Department for evaluation of AMS. Per EMS, pt arrives from [facility] center where he was transferred after discharge from the hospital today. The nursing staff at the [facility] center went to the assess the pt this evening and noticed him to be mentally altered, although the nurse is unsure of his baseline mental status.  EMS stated the pt would not respond to verbal commands en route. He was admitted 15 days ago for pneumonia and discharged today to the [facility].  Pt is a DNR.    Past Medical History  Diagnosis Date  . CAD (coronary artery disease)     Multivessel status post stent to LAD 1998 then CABG 2003  . Myocardial infarction (HCC)   . History of stroke 34  . Type 2 diabetes mellitus (HCC)   . Essential hypertension   . Dyslipidemia   . Migraine headache   . GERD (gastroesophageal reflux disease)   . Anxiety   . Cervical disc disease    Past Surgical History  Procedure Laterality Date  . Coronary artery bypass graft  2003    Dr. Cornelius Moras - LIMA to LAD, SVG to OM, SVG to RCA  . Carotid endarterectomy Left   . Back surgery    . Anterior cervical decomp/discectomy fusion     Family  History  Problem Relation Age of Onset  . Heart attack Father   . Heart attack Mother   . Heart attack Brother    Social History  Substance Use Topics  . Smoking status: Current Every Day Smoker -- 0.50 packs/day    Types: Cigarettes  . Smokeless tobacco: None  . Alcohol Use: No    Review of Systems  Unable to perform ROS: Mental status change   Allergies  Ibuprofen  Home Medications   Prior to Admission medications   Medication Sig Start Date End Date Taking? Authorizing Provider  amLODipine (NORVASC) 10 MG tablet Take 10 mg by mouth daily.    Historical Provider, MD  atorvastatin (LIPITOR) 20 MG tablet Take 20 mg by mouth daily.    Historical Provider, MD  clonazePAM (KLONOPIN) 1 MG tablet Take 1 mg by mouth 2 (two) times daily.    Historical Provider, MD  clopidogrel (PLAVIX) 75 MG tablet Take 75 mg by mouth daily.    Historical Provider, MD  esomeprazole (NEXIUM) 40 MG capsule Take 40 mg by mouth daily before breakfast.    Historical Provider, MD  furosemide (LASIX) 40 MG tablet Take 0.5-1 tablets (20-40 mg total) by mouth every other day. Alternated a half pill with whole pill every other day Patient taking differently: Take 40 mg by mouth daily.  03/14/15  Jonelle Sidle, MD  gabapentin (NEURONTIN) 300 MG capsule Take 300 mg by mouth 3 (three) times daily.    Historical Provider, MD  glimepiride (AMARYL) 2 MG tablet Take 2 mg by mouth daily.     Historical Provider, MD  metFORMIN (GLUCOPHAGE) 500 MG tablet Take 1,000 mg by mouth 2 (two) times daily with a meal.    Historical Provider, MD  metoprolol tartrate (LOPRESSOR) 25 MG tablet Take 1 tablet (25 mg total) by mouth 3 (three) times daily. 04/26/15   Henderson Cloud, MD  nitroGLYCERIN (NITROSTAT) 0.4 MG SL tablet Place 0.4 mg under the tongue every 5 (five) minutes as needed.    Historical Provider, MD  nystatin-triamcinolone (MYCOLOG II) cream Apply topically 4 (four) times daily. 04/26/15   Henderson Cloud, MD   BP 143/86 mmHg  Pulse 105  Temp(Src)   Resp 15  Ht  (1.676 m)  Wt 162 lb (73.483 kg)  BMI 26.16 kg/m2  SpO2 95% Physical Exam CONSTITUTIONAL: unresponsive, elderly, and frail  HEAD: Normocephalic/atraumatic EYES: PERRL ENMT: Mucous membranes moist NECK: supple no meningeal signs CV: S1/S2 noted, no murmurs/rubs/gallops noted LUNGS: wheezing bilaterally  ABDOMEN: soft, nontender, no rebound or guarding, bowel sounds noted throughout abdomen MV:HQION in place, rectal tube in place NEURO: Pt is unresponsive, minimal response to pain  EXTREMITIES: pulses normal/equal, full ROM SKIN: warm, color normal PSYCH: unable to assess   ED Course  Procedures  CRITICAL CARE Performed by: Joya Gaskins Total critical care time: 40 minutes Critical care time was exclusive of separately billable procedures and treating other patients. Critical care was necessary to treat or prevent imminent or life-threatening deterioration. Critical care was time spent personally by me on the following activities: development of treatment plan with patient and/or surrogate as well as nursing, discussions with consultants, evaluation of patient's response to treatment, examination of patient, obtaining history from patient or surrogate, ordering and performing treatments and interventions, ordering and review of laboratory studies, ordering and review of radiographic studies, pulse oximetry and re-evaluation of patient's condition. PATIENT WITH RESPIRATORY FAILURE REQUIRING BIPAP AND FREQUENT MONITORING   DIAGNOSTIC STUDIES: Oxygen Saturation is 95% on O2 mask, adequate by my interpretation.   11:57 PM Pt ill appearing, he is unresponsive, however vitals are appropriate Per chart, he was just discharged and placed in nursing facility after he had been intubated for pneumonia He is a DNR Suspect he might be hypercapneic Labs/imaging ordered 12:16 AM Pt now opening eyes to  voice He is still tachypneic I have attempted to call phone number listed in chart but no one answered Per chart he is a DNR and I don't see any conversations with family in chart so I am unsure if there is anyone else to call For now, will respect his wishes for DNR.  Will not intubate for now but will aggressively treat his underlying respiratory illness.  Will place on bipap for now 1:06 AM I spoke to wife and sister I updated them on plan Wife reports that pt does not wish to have heroic measures, and does not want CPR or further intubations At this point will continue with bipap Will admit to medical service 1:20 AM D/w Onalee Hua She requests bnp/troponin and will consider admission after that  Labs Review Labs Reviewed  COMPREHENSIVE METABOLIC PANEL - Abnormal; Notable for the following:    Sodium 148 (*)    Chloride 116 (*)    Glucose, Bld 135 (*)  BUN 63 (*)    Albumin 3.0 (*)    AST 14 (*)    Total Bilirubin 1.4 (*)    GFR calc non Af Amer 59 (*)    All other components within normal limits  CBC WITH DIFFERENTIAL/PLATELET - Abnormal; Notable for the following:    WBC 13.6 (*)    HCT 53.5 (*)    MCHC 28.4 (*)    RDW 18.6 (*)    Platelets 98 (*)    Neutro Abs 12.1 (*)    All other components within normal limits  BLOOD GAS, ARTERIAL - Abnormal; Notable for the following:    pH, Arterial 7.233 (*)    pCO2 arterial 62.5 (*)    All other components within normal limits  CBG MONITORING, ED - Abnormal; Notable for the following:    Glucose-Capillary 111 (*)    All other components within normal limits  URINE CULTURE  URINALYSIS, ROUTINE W REFLEX MICROSCOPIC (NOT [facility])  I-STAT CG4 LACTIC ACID, ED    Imaging Review Dg Chest Port 1 View  04/25/2015  CLINICAL DATA:  PICC placement EXAM: PORTABLE CHEST 1 VIEW COMPARISON:  04/21/2015 chest radiograph. FINDINGS: Right PICC terminates in the upper third of the superior vena cava. Median sternotomy wires are aligned and  intact. Partially visualized is surgical hardware overlying the lower cervical spine. Stable cardiomediastinal silhouette with mild cardiomegaly. No pneumothorax. Stable small left pleural effusion. No right pleural effusion. Mild pulmonary edema, slightly increased. Mild patchy bibasilar lung opacities, favor atelectasis. IMPRESSION: 1. Mild congestive heart failure, slightly increased pulmonary edema. 2. Stable small left pleural effusion. 3. Stable patchy bibasilar lung opacities, favor atelectasis. 4. Right PICC terminates in the upper third of the superior vena cava. Electronically Signed   By: Delbert PhenixJason A Poff M.D.   On: 04/25/2015 12:24   I have personally reviewed and evaluated these images and lab results as part of my medical decision-making.   EKG Interpretation   Date/Time:  Friday April 26 2015 23:40:44 EST Ventricular Rate:  103 PR Interval:  131 QRS Duration: 98 QT Interval:  311 QTC Calculation: 407 R Axis:   110 Text Interpretation:  Sinus tachycardia Multiform ventricular premature  complexes Right axis deviation Minimal ST depression, lateral leads  Abnormal ekg Confirmed by Bebe ShaggyWICKLINE  MD, Dorinda HillNALD ([id number]) on 04/26/2015  11:44:52 PM      MDM   Final diagnoses:  HCAP (healthcare-associated pneumonia)  Acute respiratory failure, unspecified whether with hypoxia or hypercapnia (HCC)  Dehydration  Hypernatremia   Previous records reviewed and considered Nursing notes including past medical history and social history reviewed and considered in documentation xrays/imaging reviewed by myself and considered during evaluation Labs/vital reviewed myself and considered during evaluation  I personally performed the services described in this documentation, which was scribed in my presence. The recorded information has been reviewed and is accurate.        Zadie Rhineonald Calbert Hulsebus, MD 04/27/15 [phone]

## 2015-04-26 NOTE — Progress Notes (Signed)
He has been transferred to regular room now. He is undergoing treatment with physical therapy occupational therapy. It is felt that he is going to require skilled care facility for rehabilitation at discharge. I think that's appropriate. I will plan to sign off at this time. He will need follow-up chest x-ray in about a month to make sure that his infiltrate clears.  Thanks for allowing me to see him with you. I will be glad to see him at any time

## 2015-04-26 NOTE — Care Management Note (Signed)
Case Management Note  Patient Details  Name: Miguel Mendoza MRN: [id number] Date of Birth: 09/22/1940  Expected Discharge Date:  04/15/15               Expected Discharge Plan:  Skilled Nursing Facility  In-House Referral:  Clinical Social Work  Discharge planning Services  CM Consult  Post Acute Care Choice:  NA Choice offered to:  NA  DME Arranged:    DME Agency:     HH Arranged:    HH Agency:     Status of Service:  Completed, signed off  Medicare Important Message Given:  Yes Date Medicare IM Given:    Medicare IM give by:    Date Additional Medicare IM Given:    Additional Medicare Important Message give by:     If discussed at Long Length of Stay Meetings, dates discussed:    Additional Comments: Pt discharging today to SNF. CSW has arranged for placement. No CM needs.  Malcolm Metrohildress, Mistina Coatney Demske, RN 04/26/2015, 1:59 PM

## 2015-04-26 NOTE — Progress Notes (Signed)
Patient discharging [facility].  PICC removed - WNL, pressure dressing intact.  Report given to yolanda at facility. flexiseal and foley intact.  Family aware of plan.  Will be transferred via bed once elevator is functional.

## 2015-04-26 NOTE — Discharge Summary (Signed)
Physician Discharge Summary  Miguel Mendoza:[id number] DOB: 08/20/1940 DOA: 04/11/2015  PCP: [name], MD  Admit date: 04/11/2015 Discharge date: 04/26/2015  Time spent: 45 minutes  Recommendations for Outpatient Follow-up:  -Will be discharged to skilled nursing facility today for rehabilitation purposes. -Recommend repeat chest x-ray in 4-6 weeks to ensure complete resolution of his pneumonia. -Check basic metabolic panel in 3 days to follow sodium levels.   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 Karilyn Cota on 12/15: Miguel Mendoza is a 74 y.o. male  This is a 74 year old man, diabetic, history of coronary artery disease and chronic diastolic congestive heart failure, now presents with dyspnea the last couple of days. He denies any significant cough or fever. There is no chest pain. Echocardiogram done in October 2016 at [facility] showed mild left ventricular hypertrophy with ejection fraction 55-60%. In the emergency room, he was hypoxemic and required BiPAP. However he seems to be comfortable on the BiPAP. He is going to be admitted for further management  now.  Hospital Course:   Acute hypoxemic and hypercarbic respiratory failure -Etiology likely multifactorial: COPD, right greater than left pleural effusion, CHF, pneumonia. -Was intubated 12/16 due to progression of respiratory failure on BiPAP. Extubated 12/27. -Had right-sided thoracentesis with 270 mL of parapneumonic effusion removed. -Appreciate pulmonary following.  Right greater than left pleural effusion -Status post thoracentesis on 12/23 with 270 mL of fluid removed that later was identified as parapneumonic effusion.  Community-acquired pneumonia -Had been on antibiotics for over 12 days. -Currently off antibiotics. -All culture data remains negative to date.  COPD with acute exacerbation -Continue nebs. -Completed steroid titration while in the hospital.  Diabetes mellitus -We'll start sliding scale today. -Moved to start basal insulin given ongoing steroids and diet initiation.  Nutrition -Guided resumed, no difficulties.  Acute on chronic diastolic CHF -2-D echo: EF 55-60%, grade 1 diastolic dysfunction. -Appears euvolemic to hypovolemic. -Lasix has been discontinued.  Hypernatremia -152 on 12/29 down from 158. -Recheck in 3 days. -Encourage by mouth fluid intake.  History of coronary artery disease -Appears stable at this venue.  Elevated troponin -Mild elevation to 0.05. Likely represents demand ischemia given complexity of overall illness and respiratory failure. -2-D echo: Ejection fraction of 55-60%, normal wall motion, grade 1 diastolic dysfunction. -No further workup planned for at present.  Acute renal failure -Improving. -Related to acute illness, ATN. -Monitor renal function. -Creatinine is 1.19 on discharge.  Tachycardia arrhythmia -Unclear if A. fib, SVT. -Cardiology on board. Metoprolol dose increased to 25 mg 3 times a day.  Sacral/Buttock Excoriation -Continue zinc oxide/antifungal cream. Turn frequently. -Keep foley and rectal  tube in place to avoid further excoriation.   Procedures:  See above   Consultations:  Cardiology  Pulmonary  Discharge Instructions  Discharge Instructions    Increase activity slowly    Complete by:  As directed             Medication List    STOP taking these medications        isosorbide mononitrate 30 MG 24 hr tablet  Commonly known as:  IMDUR     lisinopril 2.5 MG tablet  Commonly known as:  PRINIVIL,ZESTRIL     oxyCODONE-acetaminophen 7.5-325 MG tablet  Commonly known as:  PERCOCET     tiZANidine 4 MG tablet  Commonly known as:  ZANAFLEX     traZODone 100 MG tablet  Commonly known as:  DESYREL      TAKE these medications        amLODipine 10 MG tablet  Commonly known as:  NORVASC  Take 10 mg by mouth daily.     atorvastatin 20 MG tablet  Commonly known as:  LIPITOR  Take 20 mg by mouth daily.     clonazePAM 1 MG tablet  Commonly known as:  KLONOPIN  Take 1 mg by mouth 2 (two) times daily.     clopidogrel 75 MG tablet  Commonly known as:  PLAVIX  Take 75 mg by mouth daily.     esomeprazole 40 MG capsule  Commonly known as:  NEXIUM  Take 40 mg by mouth daily before breakfast.     furosemide 40 MG tablet  Commonly known as:  LASIX  Take 0.5-1 tablets (20-40 mg total) by mouth every other day. Alternated a half pill with whole pill every other day     gabapentin 300 MG capsule  Commonly known as:  NEURONTIN  Take 300 mg by mouth 3 (three) times daily.     glimepiride 2 MG tablet  Commonly known as:  AMARYL  Take 2 mg by mouth daily.     metFORMIN 500 MG tablet  Commonly known as:  GLUCOPHAGE  Take 1,000 mg by mouth 2 (two) times daily with a meal.     metoprolol tartrate 25 MG tablet  Commonly known as:  LOPRESSOR  Take 1 tablet (25 mg total) by mouth 3 (three) times daily.     nitroGLYCERIN 0.4 MG SL tablet  Commonly known as:  NITROSTAT  Place 0.4 mg under the tongue every 5 (five) minutes as needed.      nystatin-triamcinolone cream  Commonly known as:  MYCOLOG II  Apply topically 4 (four) times daily.       Allergies  Allergen Reactions  . Ibuprofen Hives      The results of significant diagnostics from this hospitalization (including imaging, microbiology, ancillary and laboratory) are listed below for reference.    Significant Diagnostic Studies: Ct Head Wo Contrast  04/11/2015  CLINICAL DATA:  Altered mental status.  Evaluate for bleed. EXAM: CT HEAD WITHOUT CONTRAST TECHNIQUE: Contiguous axial images were obtained from the base of the skull through the vertex without intravenous contrast. COMPARISON:  02/07/2015 FINDINGS: Sinuses/Soft tissues: Mild motion degradation. Clear paranasal sinuses and mastoid air cells. Intracranial: Cerebral atrophy. 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 Mild low density in the periventricular white matter likely related to small vessel disease. Vertebral and carotid atherosclerosis. No mass lesion, hemorrhage, hydrocephalus, acute infarct, intra-axial, or extra-axial fluid collection. IMPRESSION: 1.  No acute intracranial abnormality. 2. Motion degradation. 3.  Cerebral atrophy and small vessel  ischemic change. 4. 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 Electronically Signed   By: Jeronimo Greaves M.D.   On: 04/11/2015 22:28   Ct Chest Wo Contrast  04/18/2015  CLINICAL DATA:  Bilateral pleural effusions. Hypoxic respiratory failure. Pneumonia. EXAM: CT CHEST WITHOUT CONTRAST TECHNIQUE: Multidetector CT imaging of the chest was performed following the standard protocol without IV contrast. COMPARISON:  04/12/2015 FINDINGS: Endotracheal tube is in adequate position. Right-sided PICC line as tip within the SVC. Enteric tube with tip over the mid stomach. Lungs are adequately inflated and demonstrate interval improvement of a moderate size right pleural effusion with associated compressive atelectasis within the right base. Slight  worsening consolidation of the posterior medial left base likely atelectasis with tiny amount of associated pleural fluid. Subtle hazy attenuation in the perihilar regions suggesting minimal interstitial edema. Tiny calcified granuloma over the lateral left upper lobe. Airways are within normal. There is mild stable cardiomegaly. There is calcified plaque over the coronary arteries and thoracic aorta. Median sternotomy wires are present. There has been a slight decrease in size of the precarinal lymph node which now measures 1 cm likely reactive. No evidence of hilar or axillary adenopathy. Remaining mediastinal structures are within normal. Images through the upper abdomen are unchanged. Mild degenerate change of the spine. IMPRESSION: Mild interval decrease in size of a moderate size right pleural effusion with associated compressive atelectasis in the right base. Slight worsening consolidation over the posterior medial left base likely atelectasis with tiny amount of pleural fluid. Cardiomegaly with findings suggesting subtle interstitial edema. Electronically Signed   By: Elberta Fortis M.D.   On: 04/18/2015 13:50   Ct Chest Wo Contrast  04/12/2015  CLINICAL DATA:  Respiratory failure. EXAM: CT CHEST WITHOUT CONTRAST TECHNIQUE: Multidetector CT imaging of the chest was performed following the standard protocol without IV contrast. COMPARISON:  02/07/2015 FINDINGS: Mediastinum: Previous median sternotomy and CABG procedure. Moderate cardiac enlargement. Aortic atherosclerosis noted. The trachea is patent and midline. The ET tube tip is above the carina. OG tube appears scratch set there is a nasogastric tube within a normal appearing esophagus. Prominent mediastinal nodes are identified. Index right paratracheal lymph node measures 1.4 cm, image 23/ series 2. Unchanged from previous exam. No axillary or supraclavicular adenopathy. Lungs/Pleura: Moderate to large right pleural effusion identified. There is  airspace consolidation involving the entire right lower lobe. This appears progressive when compared with previous exam. Small left pleural effusion with posterior medial consolidation is identified. Upper Abdomen: The adrenal glands are normal. Normal appearance of the liver and spleen. The visualized portions of the pancreas appear normal. Musculoskeletal: Spondylosis noted within the thoracic spine. No aggressive lytic or sclerotic bone lesions. IMPRESSION: 1. Interval increase in volume of right pleural effusion with complete consolidation of the right lower lobe. 2. New posterior medial consolidation of the left lower lobe. 3. Cardiac enlargement and aortic atherosclerosis. 4. Enlarged right paratracheal lymph node. Similar to previous exam. Electronically Signed   By: Signa Kell M.D.   On: 04/12/2015 16:31   [place] Chest  04/12/2015  CLINICAL DATA:  Pleural effusion. EXAM: CHEST ULTRASOUND COMPARISON:  Chest x-ray 04/11/2015. FINDINGS: Small right pleural effusion is noted. Patient could not tolerate positioning for evaluation. Given the small size of the effusion and difficulty in positioning patient, thoracentesis not performed. IMPRESSION: Small pleural effusion.  Thoracentesis not performed. Electronically Signed   By: Maisie Fus  Register   On: 04/12/2015 10:29   Dg Chest Port 1 View  04/25/2015  CLINICAL DATA:  PICC placement EXAM: PORTABLE CHEST 1 VIEW COMPARISON:  04/21/2015 chest radiograph. FINDINGS: Right PICC terminates in the upper third of the superior vena cava. Median sternotomy wires are aligned and intact. Partially visualized is surgical hardware overlying the lower cervical spine. Stable cardiomediastinal silhouette with mild cardiomegaly. No pneumothorax. Stable small left pleural effusion. No right pleural effusion. Mild pulmonary edema, slightly increased. Mild patchy bibasilar lung opacities, favor atelectasis. IMPRESSION: 1. Mild congestive heart failure, slightly increased  pulmonary edema. 2. Stable small left pleural effusion. 3. Stable patchy bibasilar lung opacities, favor atelectasis. 4. Right PICC terminates in the upper third of the superior vena cava. Electronically Signed   By: Delbert Phenix M.D.   On: 04/25/2015 12:24   Dg Chest Port 1 View  04/21/2015  CLINICAL DATA:  Respiratory failure EXAM: PORTABLE CHEST 1 VIEW COMPARISON:  04/19/2015 FINDINGS: The ET tube tip is above the carina. There is a a right arm PICC line with tip in the cavoatrial junction. Heart size is normal. There is a moderate left pleural effusion identified no right pleural effusion. Diminished aeration to the left lung base noted. IMPRESSION: 1. Decreased aeration to the left lung base with associated left pleural effusion. 2. Stable support apparatus. Electronically Signed   By: Signa Kell M.D.   On: 04/21/2015 09:04   Dg Chest Port 1 View  04/19/2015  CLINICAL DATA:  RIGHT pleural effusion post thoracentesis EXAM: PORTABLE CHEST 1 VIEW COMPARISON:  Portable exam 1412 hours compared to 0602 hours FINDINGS: Decrease in RIGHT pleural effusion and RIGHT basilar atelectasis versus earlier study. No definite pneumothorax. Stable endotracheal tube, nasogastric tube and RIGHT arm PICC line. Enlargement of cardiac silhouette post CABG. Atelectasis and effusion at LEFT base remain. Atherosclerotic calcification aorta. IMPRESSION: No pneumothorax following RIGHT thoracentesis. Electronically Signed   By: Ulyses Southward M.D.   On: 04/19/2015 14:37   Dg Chest Port 1 View  04/19/2015  CLINICAL DATA:  Ventilator.  Respiratory failure EXAM: PORTABLE CHEST 1 VIEW COMPARISON:  04/17/2015 FINDINGS: Prior CABG. There is cardiomegaly. Bilateral lower lobe opacities with layering effusions. Some improvement in aeration at the right lung base since prior study. Otherwise no change. IMPRESSION: Some improvement in aeration at the right lung base. Otherwise no change. Electronically Signed   By: Charlett Nose M.D.    On: 04/19/2015 08:11   Dg Chest Port 1 View  04/17/2015  CLINICAL DATA:  Respiratory failure. EXAM: PORTABLE CHEST 1 VIEW COMPARISON:  04/16/2015 . FINDINGS: Endotracheal tube, NG tube, right PICC line in stable position. Prior CABG. Cardiomegaly with pulmonary venous congestion. Bilateral lower lobe infiltrates and or edema noted. Bilateral pleural effusions noted. Findings suggest congestive heart failure. Bibasilar pneumonia cannot be excluded. Low lung volumes with basilar atelectasis . No pneumothorax . Prior cervical spine fusion . IMPRESSION: 1. Lines and tubes in stable position. 2. Prior CABG. Cardiomegaly with pulmonary venous congestion. Bibasilar pulmonary infiltrates and or edema noted. Bilateral pleural effusions noted. Findings consistent with congestive heart failure. No significant change from prior exam. 3. Lung volumes with basilar atelectasis. Electronically Signed   By: Maisie Fus  Register   On: 04/17/2015 07:55   Dg Chest Port 1 View  04/16/2015  CLINICAL DATA:  Altered mental status, respiratory failure EXAM: PORTABLE CHEST 1 VIEW COMPARISON:  04/15/2015 FINDINGS: Endotracheal tube, NG tube and RIGHT PICC line are unchanged. There are low lung volumes and bilateral pleural effusions unchanged. Mild central venous congestion. No pneumothorax. IMPRESSION: 1. Stable support apparatus. 2.  Low lung volumes with bilateral pleural effusions with no significant change. Electronically Signed   By: Genevive Bi M.D.   On: 04/16/2015 08:47   Dg Chest Port 1 View  04/15/2015  CLINICAL DATA:  Shortness of breath. EXAM: PORTABLE CHEST 1 VIEW COMPARISON:  04/14/2015.  CT 04/12/2015. FINDINGS: Endotracheal tube and right PICC line stable position PICC line in stable position. Prior CABG. Cardiomegaly with pulmonary vascular prominence and interstitial prominence suggesting mild congestive heart failure. Persistent right mid and lower lung infiltrate. Small bilateral pleural effusions. No  pneumothorax . IMPRESSION: 1. Endotracheal tube and right PICC line in stable position. 2. Prior CABG. Stable cardiomegaly. Mild pulmonary vascular prominence interstitial prominence noted suggesting mild congestive heart failure. Small bilateral pleural effusions noted. 3. Set right mid lung and lower lung infiltrate. Electronically Signed   By: Maisie Fus  Register   On: 04/15/2015 07:43   Dg Chest Port 1 View  04/14/2015  CLINICAL DATA:  Acute respiratory failure, disorientation. EXAM: PORTABLE CHEST 1 VIEW COMPARISON:  Chest x-ray and chest CT dated 04/12/2015, chest x-ray dated 04/11/2015 and chest x-ray dated 02/07/2015. FINDINGS: Endotracheal tube appears well positioned with tip just above the level of the carina. Enteric tube passes below the diaphragm. A right subclavian central line is better seen on previous chest CT, presumably obscured on this chest x-ray by the numerous lines overlying the right chest and mediastinum. Lung aeration is improved bilaterally. Suspect decreased right pleural effusion. Persistent dense opacities at each lung base compatible with the atelectasis identified on earlier chest CT. IMPRESSION: Improved aeration of both lungs indicating improved fluid status. Suspect at least some decrease in the size of the right pleural effusion. Probably stable atelectasis at each lung base. Tubes and lines grossly stable in position. Electronically Signed   By: Bary Richard M.D.   On: 04/14/2015 11:03   Dg Chest Port 1 View  04/12/2015  CLINICAL DATA:  Endotracheal tube placement. EXAM: PORTABLE CHEST 1 VIEW COMPARISON:  April 11, 2015. FINDINGS: Stable cardiomegaly. Status post coronary artery bypass graft. Patient is rotated to the right. Mild central pulmonary vascular congestion is noted. Moderate right pleural effusion is again noted with probable underlying atelectasis or infiltrate. No pneumothorax is noted. Endotracheal tube is seen projected over tracheal air shadow with  distal tip at least 2 cm above the carina. Nasogastric tube is seen entering the stomach. IMPRESSION: Endotracheal tube appears to be in grossly good position. Stable cardiomegaly and central pulmonary vascular congestion is noted. Stable moderate right pleural effusion with probable underlying atelectasis or infiltrate. Electronically Signed   By: Lupita Raider, M.D.   On: 04/12/2015 11:04   Dg Chest Portable 1 View  04/11/2015  CLINICAL DATA:  Hypoxia.  Cough. EXAM: PORTABLE CHEST 1 VIEW COMPARISON:  February 07, 2015. FINDINGS: Stable cardiomegaly. Status post coronary artery bypass graft. No pneumothorax is noted. Mild central pulmonary vascular congestion is again noted and stable. Mild left pleural effusion is noted. Moderate right pleural effusion is noted which is increased compared to prior exam, with probable underlying atelectasis or infiltrate. Bony thorax is unremarkable. IMPRESSION: Stable mild left pleural effusion. Significantly increased moderate right pleural effusion with probable underlying atelectasis or infiltrate. Electronically Signed   By: Lupita Raider, M.D.   On: 04/11/2015 18:31   Dg Chest Port 1v Same Day  04/16/2015  CLINICAL DATA:  Check OG placement EXAM: PORTABLE CHEST - 1 VIEW SAME DAY COMPARISON:  04/16/2015 FINDINGS: Endotracheal tube is again identified in  satisfactory position. A right-sided PICC line is noted at cavoatrial junction. A orogastric catheter is seen extending into the stomach. Bilateral pleural effusions and bibasilar atelectatic changes are again seen. No acute bony abnormality is noted. IMPRESSION: Tubes and lines as described in satisfactory position. Stable bibasilar changes. Electronically Signed   By: Alcide Clever M.D.   On: 04/16/2015 20:36   US Thoracentesis Asp Pleural Space W/img Guide  04/19/2015  CLINICAL DATA:  RIGHT pleural effusion, RIGHT lower lobe atelectasis, high oxygen requirements, on ventilator EXAM: ULTRASOUND GUIDED DIAGNOSTIC  AND THERAPEUTIC RIGHT THORACENTESIS COMPARISON:  Chest CT 04/17/2014 PROCEDURE: Procedure, benefits, and risks of procedure were discussed with patient's wife. Written informed consent for procedure was obtained. Time out protocol followed. Procedure performed portably in ICU. Patient placed in an LPO projection. Pleural effusion localized by ultrasound at the lateral RIGHT hemithorax. Skin prepped and draped in usual sterile fashion. Skin and soft tissues anesthetized with 10 mL of 1% lidocaine. Under direct sonographic visualization, 5 [place] Yueh catheter placed into the RIGHT pleural space. 270 mL of yellow RIGHT pleural fluid was aspirated by syringe. Due to limited positioning of patient for access of fluid, was unable to aspirate additional fluid. Procedure tolerated well by patient without immediate complication. COMPLICATIONS: None. FINDINGS: A total of approximately 270 mL of RIGHT pleural fluid was removed. A fluid sample of 180 mL was sent for laboratory analysis. IMPRESSION: Successful ultrasound guided RIGHT thoracentesis yielding 270 mL of pleural fluid. Electronically Signed   By: Ulyses Southward M.D.   On: 04/19/2015 14:42    Microbiology: Recent Results (from the past 240 hour(s))  Culture, body fluid-bottle     Status: None   Collection Time: 04/19/15 12:22 PM  Result Value Ref Range Status   Specimen Description PLEURAL  Final   Special Requests NONE  Final   Culture   Final    NO GROWTH 5 DAYS Performed at [facility]    Report Status 04/25/2015 FINAL  Final  Gram stain     Status: None   Collection Time: 04/19/15 12:22 PM  Result Value Ref Range Status   Specimen Description PLEURAL  Final   Special Requests NONE  Final   Gram Stain   Final    CYTOSPIN SMEAR WBC PRESENT, PREDOMINANTLY MONONUCLEAR NO ORGANISMS SEEN Performed at [facility]    Report Status 04/20/2015 FINAL  Final  Culture, body fluid-bottle     Status: None   Collection Time: 04/19/15   2:30 PM  Result Value Ref Range Status   Specimen Description PLEURAL  Final   Special Requests NONE  Final   Culture NO GROWTH 5 DAYS  Final   Report Status 04/24/2015 FINAL  Final     Labs: Basic Metabolic Panel:  Recent Labs Lab 04/21/15 0842 04/22/15 0450 04/23/15 2210 04/24/15 0430 04/24/15 1138 04/24/15 1649 04/24/15 2248 04/25/15 0550  NA 147* 149* 155* 158* 156* 157* 154* 152*  K 2.8* 3.4* 3.7 3.4*  --   --   --  4.0  CL 107 106 115* 117*  --   --   --  119*  CO2 32 31 28 29   --   --   --  24  GLUCOSE 146* 171* 218* 219*  --   --   --  209*  BUN 104* 118* 128* 142*  --   --   --  104*  CREATININE 1.26* 1.35* 1.58* 1.39*  --   --   --  1.19  CALCIUM 9.5 9.9 10.4* 10.2  --   --   --  10.2  MG  --   --  2.9*  --   --   --   --   --    Liver Function Tests:  Recent Labs Lab 04/21/15 0842  AST 21  ALT 14*  ALKPHOS 50  BILITOT 0.8  PROT 6.8  ALBUMIN 3.3*   No results for input(s): LIPASE, AMYLASE in the last 168 hours. No results for input(s): AMMONIA in the last 168 hours. CBC:  Recent Labs Lab 04/21/15 0842 04/25/15 0550  WBC 9.8 12.9*  NEUTROABS 8.0*  --   HGB 13.1 14.2  HCT 45.9 50.6  MCV 90.0 92.2  PLT 171 128*   Cardiac Enzymes: No results for input(s): CKTOTAL, CKMB, CKMBINDEX, TROPONINI in the last 168 hours. BNP: BNP (last 3 results)  Recent Labs  04/11/15 1812 04/12/15 0449  BNP 422.0* 520.0*    ProBNP (last 3 results) No results for input(s): PROBNP in the last 8760 hours.  CBG:  Recent Labs Lab 04/25/15 1113 04/25/15 1639 04/25/15 2041 04/26/15 0823 04/26/15 1153  GLUCAP 154* 164* 186* 162* 195*       Signed:  HERNANDEZ ACOSTA,ESTELA  [facility] Pager: [phone] 04/26/2015, 12:17 PM

## 2015-04-26 NOTE — Evaluation (Signed)
Occupational Therapy Evaluation Patient Details Name: Miguel Mendoza See MRN: [id number] DOB: 01-02-1941 Today's Date: 04/26/2015    History of Present Illness This is a 74 year old man, diabetic, history of coronary artery disease and chronic diastolic congestive heart failure, now presents with dyspnea the last couple of days. He denies any significant cough or fever. There is no chest pain. [facility] showed mild left ventricular hypertrophy with ejection fraction 55-60%. In the emergency room, he was hypoxemic and required BiPAP.   Clinical Impression   Pt awake this am, very lethargic with limited ability to engage with OT. Pt laying sideways in bed, total assist for bed mobility to straighten. Pt unable to follow simple, one-step commands this am. Pt demonstrates extreme generalized weakness, was able to lightly squeeze OT's fingers. Continues to be unable to turn head to right from neutral position. Pt is currently total assist with ADL tasks, would benefit from continued skilled OT services to improve ability to participate in ADL tasks, bed mobility, and functional mobility tasks. Recommend SNF on discharge.     Follow Up Recommendations  SNF    Equipment Recommendations  None recommended by OT       Precautions / Restrictions Precautions Precautions: Fall Restrictions Weight Bearing Restrictions: No      Mobility Bed Mobility               General bed mobility comments: unable to attempt mobilization due to lethargy          ADL Overall ADL's : Needs assistance/impaired                                       General ADL Comments: Pt is currently total assist with all ADL tasks, as he is extremely weak with limited ability to follow simple commands               Pertinent Vitals/Pain Pain Assessment: No/denies pain     Hand Dominance Right   Extremity/Trunk Assessment Upper Extremity  Assessment Upper Extremity Assessment: Generalized weakness (weakness throughout BUE)   Lower Extremity Assessment Lower Extremity Assessment: Defer to PT evaluation   Cervical / Trunk Assessment Cervical / Trunk Assessment: Other exceptions Cervical / Trunk Exceptions: able to rotate head to the left but unable to rotate past neutral to the right   Communication Communication Communication: Expressive difficulties (difficulty speaking)   Cognition Arousal/Alertness: Lethargic Behavior During Therapy: Flat affect Overall Cognitive Status: No family/caregiver present to determine baseline cognitive functioning                                Home Living Family/patient expects to be discharged to:: Skilled nursing facility Living Arrangements: Spouse/significant other                                      Prior Functioning/Environment Level of Independence: Independent with assistive device(s)        Comments: used a cane for gait    OT Diagnosis: Generalized weakness   OT Problem List: Decreased strength;Decreased activity tolerance   OT Treatment/Interventions: Self-care/ADL training;Therapeutic exercise;Therapeutic activities;Patient/family education    OT Goals(Current goals can be found in the care plan section) Acute Rehab OT Goals Patient Stated Goal: none  stated OT Goal Formulation: Patient unable to participate in goal setting Time For Goal Achievement: 05/10/15 Potential to Achieve Goals: Good  OT Frequency: Min 2X/week    End of Session    Activity Tolerance: Patient limited by lethargy Patient left: in bed;with call bell/phone within reach;with bed alarm set;with nursing/sitter in room   Time: 0800-0811 OT Time Calculation (min): 11 min Charges:  OT General Charges $OT Visit: 1 Procedure OT Evaluation $Initial OT Evaluation Tier I: 1 Procedure  Ezra Sites, OTR/L  [phone]  04/26/2015, 9:11 AM

## 2015-04-27 ENCOUNTER — Inpatient Hospital Stay (HOSPITAL_COMMUNITY): Payer: Medicare HMO

## 2015-04-27 DIAGNOSIS — J9621 Acute and chronic respiratory failure with hypoxia: Secondary | ICD-10-CM | POA: Diagnosis present

## 2015-04-27 DIAGNOSIS — G9340 Encephalopathy, unspecified: Secondary | ICD-10-CM | POA: Diagnosis not present

## 2015-04-27 DIAGNOSIS — J439 Emphysema, unspecified: Secondary | ICD-10-CM | POA: Diagnosis not present

## 2015-04-27 DIAGNOSIS — Z8249 Family history of ischemic heart disease and other diseases of the circulatory system: Secondary | ICD-10-CM | POA: Diagnosis not present

## 2015-04-27 DIAGNOSIS — J9622 Acute and chronic respiratory failure with hypercapnia: Secondary | ICD-10-CM | POA: Diagnosis present

## 2015-04-27 DIAGNOSIS — J9600 Acute respiratory failure, unspecified whether with hypoxia or hypercapnia: Secondary | ICD-10-CM

## 2015-04-27 DIAGNOSIS — J449 Chronic obstructive pulmonary disease, unspecified: Secondary | ICD-10-CM | POA: Diagnosis present

## 2015-04-27 DIAGNOSIS — E119 Type 2 diabetes mellitus without complications: Secondary | ICD-10-CM | POA: Diagnosis present

## 2015-04-27 DIAGNOSIS — F17210 Nicotine dependence, cigarettes, uncomplicated: Secondary | ICD-10-CM | POA: Diagnosis present

## 2015-04-27 DIAGNOSIS — I2510 Atherosclerotic heart disease of native coronary artery without angina pectoris: Secondary | ICD-10-CM | POA: Diagnosis present

## 2015-04-27 DIAGNOSIS — I10 Essential (primary) hypertension: Secondary | ICD-10-CM | POA: Diagnosis present

## 2015-04-27 DIAGNOSIS — Z955 Presence of coronary angioplasty implant and graft: Secondary | ICD-10-CM | POA: Diagnosis not present

## 2015-04-27 DIAGNOSIS — Y95 Nosocomial condition: Secondary | ICD-10-CM | POA: Diagnosis present

## 2015-04-27 DIAGNOSIS — E7800 Pure hypercholesterolemia, unspecified: Secondary | ICD-10-CM | POA: Diagnosis present

## 2015-04-27 DIAGNOSIS — J189 Pneumonia, unspecified organism: Secondary | ICD-10-CM | POA: Diagnosis present

## 2015-04-27 DIAGNOSIS — E785 Hyperlipidemia, unspecified: Secondary | ICD-10-CM | POA: Diagnosis present

## 2015-04-27 DIAGNOSIS — Z8673 Personal history of transient ischemic attack (TIA), and cerebral infarction without residual deficits: Secondary | ICD-10-CM | POA: Diagnosis not present

## 2015-04-27 DIAGNOSIS — Z66 Do not resuscitate: Secondary | ICD-10-CM | POA: Diagnosis present

## 2015-04-27 DIAGNOSIS — I252 Old myocardial infarction: Secondary | ICD-10-CM | POA: Diagnosis not present

## 2015-04-27 DIAGNOSIS — J440 Chronic obstructive pulmonary disease with acute lower respiratory infection: Secondary | ICD-10-CM | POA: Diagnosis present

## 2015-04-27 DIAGNOSIS — K219 Gastro-esophageal reflux disease without esophagitis: Secondary | ICD-10-CM | POA: Diagnosis present

## 2015-04-27 DIAGNOSIS — Z981 Arthrodesis status: Secondary | ICD-10-CM | POA: Diagnosis not present

## 2015-04-27 DIAGNOSIS — Z7984 Long term (current) use of oral hypoglycemic drugs: Secondary | ICD-10-CM | POA: Diagnosis not present

## 2015-04-27 DIAGNOSIS — R4182 Altered mental status, unspecified: Secondary | ICD-10-CM | POA: Diagnosis present

## 2015-04-27 DIAGNOSIS — Z951 Presence of aortocoronary bypass graft: Secondary | ICD-10-CM | POA: Diagnosis not present

## 2015-04-27 DIAGNOSIS — E870 Hyperosmolality and hypernatremia: Secondary | ICD-10-CM | POA: Diagnosis present

## 2015-04-27 DIAGNOSIS — Z7902 Long term (current) use of antithrombotics/antiplatelets: Secondary | ICD-10-CM | POA: Diagnosis not present

## 2015-04-27 LAB — BLOOD GAS, ARTERIAL
ACID-BASE DEFICIT: 1.2 mmol/L (ref 0.0–2.0)
ACID-BASE EXCESS: 2.3 mmol/L — AB (ref 0.0–2.0)
BICARBONATE: 21.6 meq/L (ref 20.0–24.0)
BICARBONATE: 21.9 meq/L (ref 20.0–24.0)
DRAWN BY: 21310
DRAWN BY: 234301
Delivery systems: POSITIVE
EXPIRATORY PAP: 8
FIO2: 100
FIO2: 60
Inspiratory PAP: 16
O2 SAT: 91.8 %
O2 SAT: 95.9 %
PCO2 ART: 50.9 mmHg — AB (ref 35.0–45.0)
PCO2 ART: 62.5 mmHg — AB (ref 35.0–45.0)
PH ART: 7.285 — AB (ref 7.350–7.450)
TCO2: 16.5 mmol/L (ref 0–100)
pH, Arterial: 7.233 — ABNORMAL LOW (ref 7.350–7.450)
pO2, Arterial: 71.9 mmHg — ABNORMAL LOW (ref 80.0–100.0)
pO2, Arterial: 95.9 mmHg (ref 80.0–100.0)

## 2015-04-27 LAB — URINALYSIS, ROUTINE W REFLEX MICROSCOPIC
BILIRUBIN URINE: NEGATIVE
Glucose, UA: NEGATIVE mg/dL
KETONES UR: NEGATIVE mg/dL
NITRITE: NEGATIVE
PROTEIN: 30 mg/dL — AB
Specific Gravity, Urine: 1.025 (ref 1.005–1.030)
pH: 5.5 (ref 5.0–8.0)

## 2015-04-27 LAB — CBC WITH DIFFERENTIAL/PLATELET
BASOS ABS: 0 10*3/uL (ref 0.0–0.1)
Basophils Relative: 0 %
EOS PCT: 0 %
Eosinophils Absolute: 0 10*3/uL (ref 0.0–0.7)
HCT: 53.5 % — ABNORMAL HIGH (ref 39.0–52.0)
Hemoglobin: 15.2 g/dL (ref 13.0–17.0)
LYMPHS PCT: 7 %
Lymphs Abs: 1 10*3/uL (ref 0.7–4.0)
MCH: 26.3 pg (ref 26.0–34.0)
MCHC: 28.4 g/dL — AB (ref 30.0–36.0)
MCV: 92.6 fL (ref 78.0–100.0)
Monocytes Absolute: 0.5 10*3/uL (ref 0.1–1.0)
Monocytes Relative: 4 %
NEUTROS PCT: 89 %
Neutro Abs: 12.1 10*3/uL — ABNORMAL HIGH (ref 1.7–7.7)
PLATELETS: 98 10*3/uL — AB (ref 150–400)
RBC: 5.78 MIL/uL (ref 4.22–5.81)
RDW: 18.6 % — AB (ref 11.5–15.5)
WBC: 13.6 10*3/uL — AB (ref 4.0–10.5)

## 2015-04-27 LAB — TROPONIN I
TROPONIN I: 0.03 ng/mL (ref <0.031)
TROPONIN I: 0.03 ng/mL (ref <0.031)
Troponin I: 0.03 ng/mL (ref <0.031)
Troponin I: 0.03 ng/mL (ref <0.031)

## 2015-04-27 LAB — COMPREHENSIVE METABOLIC PANEL
ALT: 18 U/L (ref 17–63)
ANION GAP: 6 (ref 5–15)
AST: 14 U/L — AB (ref 15–41)
Albumin: 3 g/dL — ABNORMAL LOW (ref 3.5–5.0)
Alkaline Phosphatase: 60 U/L (ref 38–126)
BILIRUBIN TOTAL: 1.4 mg/dL — AB (ref 0.3–1.2)
BUN: 63 mg/dL — AB (ref 6–20)
CO2: 26 mmol/L (ref 22–32)
Calcium: 10.2 mg/dL (ref 8.9–10.3)
Chloride: 116 mmol/L — ABNORMAL HIGH (ref 101–111)
Creatinine, Ser: 1.18 mg/dL (ref 0.61–1.24)
GFR, EST NON AFRICAN AMERICAN: 59 mL/min — AB (ref >60)
Glucose, Bld: 135 mg/dL — ABNORMAL HIGH (ref 65–99)
POTASSIUM: 5 mmol/L (ref 3.5–5.1)
Sodium: 148 mmol/L — ABNORMAL HIGH (ref 135–145)
TOTAL PROTEIN: 6.7 g/dL (ref 6.5–8.1)

## 2015-04-27 LAB — BASIC METABOLIC PANEL
Anion gap: 4 — ABNORMAL LOW (ref 5–15)
BUN: 67 mg/dL — AB (ref 6–20)
CHLORIDE: 117 mmol/L — AB (ref 101–111)
CO2: 24 mmol/L (ref 22–32)
Calcium: 10.1 mg/dL (ref 8.9–10.3)
Creatinine, Ser: 1.28 mg/dL — ABNORMAL HIGH (ref 0.61–1.24)
GFR, EST NON AFRICAN AMERICAN: 54 mL/min — AB (ref >60)
Glucose, Bld: 159 mg/dL — ABNORMAL HIGH (ref 65–99)
POTASSIUM: 5.4 mmol/L — AB (ref 3.5–5.1)
SODIUM: 145 mmol/L (ref 135–145)

## 2015-04-27 LAB — GLUCOSE, CAPILLARY
GLUCOSE-CAPILLARY: 126 mg/dL — AB (ref 65–99)
GLUCOSE-CAPILLARY: 158 mg/dL — AB (ref 65–99)
GLUCOSE-CAPILLARY: 177 mg/dL — AB (ref 65–99)
Glucose-Capillary: 139 mg/dL — ABNORMAL HIGH (ref 65–99)
Glucose-Capillary: 140 mg/dL — ABNORMAL HIGH (ref 65–99)

## 2015-04-27 LAB — CBC
HEMATOCRIT: 48.8 % (ref 39.0–52.0)
Hemoglobin: 13.5 g/dL (ref 13.0–17.0)
MCH: 25.6 pg — ABNORMAL LOW (ref 26.0–34.0)
MCHC: 27.7 g/dL — ABNORMAL LOW (ref 30.0–36.0)
MCV: 92.4 fL (ref 78.0–100.0)
PLATELETS: 104 10*3/uL — AB (ref 150–400)
RBC: 5.28 MIL/uL (ref 4.22–5.81)
RDW: 19.5 % — AB (ref 11.5–15.5)
WBC: 12.6 10*3/uL — AB (ref 4.0–10.5)

## 2015-04-27 LAB — PROCALCITONIN: Procalcitonin: 0.11 ng/mL

## 2015-04-27 LAB — URINE MICROSCOPIC-ADD ON

## 2015-04-27 LAB — I-STAT CG4 LACTIC ACID, ED
Lactic Acid, Venous: 0.92 mmol/L (ref 0.5–2.0)
Lactic Acid, Venous: 1.61 mmol/L (ref 0.5–2.0)

## 2015-04-27 LAB — BRAIN NATRIURETIC PEPTIDE: B NATRIURETIC PEPTIDE 5: 149 pg/mL — AB (ref 0.0–100.0)

## 2015-04-27 LAB — MRSA PCR SCREENING: MRSA by PCR: NEGATIVE

## 2015-04-27 MED ORDER — CEFEPIME HCL 2 G IJ SOLR
2.0000 g | Freq: Once | INTRAMUSCULAR | Status: AC
  Administered 2015-04-27: 2 g via INTRAVENOUS
  Filled 2015-04-27: qty 2

## 2015-04-27 MED ORDER — SODIUM POLYSTYRENE SULFONATE 15 GM/60ML PO SUSP
15.0000 g | Freq: Once | ORAL | Status: AC
  Administered 2015-04-27: 15 g via ORAL
  Filled 2015-04-27: qty 60

## 2015-04-27 MED ORDER — FUROSEMIDE 40 MG PO TABS: 40.0000 mg | ORAL_TABLET | Freq: Every day | ORAL | Status: DC

## 2015-04-27 MED ORDER — SODIUM CHLORIDE 0.9 % IV BOLUS (SEPSIS)
500.0000 mL | Freq: Once | INTRAVENOUS | Status: AC
  Administered 2015-04-27: 500 mL via INTRAVENOUS

## 2015-04-27 MED ORDER — VANCOMYCIN HCL IN DEXTROSE 750-5 MG/150ML-% IV SOLN
750.0000 mg | Freq: Once | INTRAVENOUS | Status: AC
  Administered 2015-04-27: 750 mg via INTRAVENOUS
  Filled 2015-04-27: qty 150

## 2015-04-27 MED ORDER — INSULIN ASPART 100 UNIT/ML ~~LOC~~ SOLN
0.0000 [IU] | SUBCUTANEOUS | Status: DC
  Administered 2015-04-27: 1 [IU] via SUBCUTANEOUS
  Administered 2015-04-27: 2 [IU] via SUBCUTANEOUS
  Administered 2015-04-27 (×2): 1 [IU] via SUBCUTANEOUS
  Administered 2015-04-27: 2 [IU] via SUBCUTANEOUS
  Administered 2015-04-28: 1 [IU] via SUBCUTANEOUS

## 2015-04-27 MED ORDER — SODIUM CHLORIDE 0.9 % IV SOLN
INTRAVENOUS | Status: DC
  Administered 2015-04-27: 06:00:00 via INTRAVENOUS

## 2015-04-27 MED ORDER — VANCOMYCIN HCL IN DEXTROSE 750-5 MG/150ML-% IV SOLN
750.0000 mg | Freq: Two times a day (BID) | INTRAVENOUS | Status: DC
  Filled 2015-04-27 (×4): qty 150

## 2015-04-27 MED ORDER — DEXTROSE 5 % IV SOLN
1.0000 g | Freq: Two times a day (BID) | INTRAVENOUS | Status: DC
  Administered 2015-04-27 – 2015-04-28 (×3): 1 g via INTRAVENOUS
  Filled 2015-04-27 (×5): qty 1

## 2015-04-27 MED ORDER — VANCOMYCIN HCL IN DEXTROSE 750-5 MG/150ML-% IV SOLN
INTRAVENOUS | Status: AC
  Filled 2015-04-27: qty 150

## 2015-04-27 MED ORDER — METHYLPREDNISOLONE SODIUM SUCC 125 MG IJ SOLR
125.0000 mg | Freq: Once | INTRAMUSCULAR | Status: AC
  Administered 2015-04-27: 125 mg via INTRAVENOUS
  Filled 2015-04-27: qty 2

## 2015-04-27 MED ORDER — ENOXAPARIN SODIUM 40 MG/0.4ML ~~LOC~~ SOLN
40.0000 mg | SUBCUTANEOUS | Status: DC
  Administered 2015-04-27 – 2015-04-28 (×2): 40 mg via SUBCUTANEOUS
  Filled 2015-04-27 (×2): qty 0.4

## 2015-04-27 MED ORDER — METOPROLOL TARTRATE 25 MG PO TABS
25.0000 mg | ORAL_TABLET | Freq: Three times a day (TID) | ORAL | Status: DC
  Administered 2015-04-27 – 2015-04-28 (×2): 25 mg via ORAL
  Filled 2015-04-27 (×2): qty 1

## 2015-04-27 MED ORDER — ENSURE ENLIVE PO LIQD
237.0000 mL | Freq: Two times a day (BID) | ORAL | Status: DC
  Administered 2015-04-28: 237 mL via ORAL

## 2015-04-27 MED ORDER — DEXTROSE 5 % IV SOLN: 1.0000 g | Freq: Once | INTRAVENOUS | Status: DC

## 2015-04-27 NOTE — Progress Notes (Signed)
CM contacted Larita FifeLynn at [facility] regarding patient.  Confirmed to have CPAP there at facility.  The staff is concerned if patient is stable [facility]  [facility] with staff and [facility]

## 2015-04-27 NOTE — Progress Notes (Signed)
ANTIBIOTIC CONSULT NOTE-Preliminary  Pharmacy Consult for Vancomycin and Cefepime Indication: Pneumonia  Allergies  Allergen Reactions  . Ibuprofen Hives    Patient Measurements: Height:  (167.6 cm) Weight: 162 lb (73.483 kg) IBW/kg (Calculated) : 63.8  Vital Signs: BP: 116/80 mmHg (12/31 0334) Pulse Rate: 90 (12/31 0359)  Labs:  Recent Labs  04/25/15 0550 04/26/15 2355  WBC 12.9* 13.6*  HGB 14.2 15.2  PLT 128* 98*  CREATININE 1.19 1.18    Estimated Creatinine Clearance: 50.3 mL/min (by C-G formula based on Cr of 1.18).  No results for input(s): VANCOTROUGH, VANCOPEAK, VANCORANDOM, GENTTROUGH, GENTPEAK, GENTRANDOM, TOBRATROUGH, TOBRAPEAK, TOBRARND, AMIKACINPEAK, AMIKACINTROU, AMIKACIN in the last 72 hours.   Microbiology: Recent Results (from the past 720 hour(s))  MRSA PCR Screening     Status: None   Collection Time: 04/11/15 11:42 AM  Result Value Ref Range Status   MRSA by PCR NEGATIVE NEGATIVE Final    Comment:        The GeneXpert MRSA Assay (FDA approved for NASAL specimens only), is one component of a comprehensive MRSA colonization surveillance program. It is not intended to diagnose MRSA infection nor to guide or monitor treatment for MRSA infections.   Culture, blood (Routine X 2) w Reflex to ID Panel     Status: None   Collection Time: 04/13/15 10:02 AM  Result Value Ref Range Status   Specimen Description BLOOD LEFT ARM  Final   Special Requests BOTTLES DRAWN AEROBIC AND ANAEROBIC 6CC EACH  Final   Culture NO GROWTH 5 DAYS  Final   Report Status 04/18/2015 FINAL  Final  Culture, blood (Routine X 2) w Reflex to ID Panel     Status: None   Collection Time: 04/13/15 10:08 AM  Result Value Ref Range Status   Specimen Description BLOOD LEFT HAND  Final   Special Requests BOTTLES DRAWN AEROBIC AND ANAEROBIC 5CC EACH  Final   Culture NO GROWTH 5 DAYS  Final   Report Status 04/18/2015 FINAL  Final  Culture, respiratory (NON-Expectorated)      Status: None   Collection Time: 04/14/15  9:32 PM  Result Value Ref Range Status   Specimen Description TRACHEAL ASPIRATE  Final   Special Requests NONE  Final   Gram Stain   Final    MODERATE WBC PRESENT, PREDOMINANTLY PMN NO SQUAMOUS EPITHELIAL CELLS SEEN MODERATE GRAM POSITIVE RODS Performed at [facility]    Culture   Final    NORMAL OROPHARYNGEAL FLORA Performed at [facility]    Report Status 04/17/2015 FINAL  Final  Culture, expectorated sputum-assessment     Status: None   Collection Time: 04/14/15  9:48 PM  Result Value Ref Range Status   Specimen Description ENDOTRACHEAL  Final   Special Requests NONE  Final   Sputum evaluation THIS SPECIMEN IS ACCEPTABLE FOR SPUTUM CULTURE  Final   Report Status 04/14/2015 FINAL  Final  Culture, body fluid-bottle     Status: None   Collection Time: 04/19/15 12:22 PM  Result Value Ref Range Status   Specimen Description PLEURAL  Final   Special Requests NONE  Final   Culture   Final    NO GROWTH 5 DAYS Performed at [facility]    Report Status 04/25/2015 FINAL  Final  Gram stain     Status: None   Collection Time: 04/19/15 12:22 PM  Result Value Ref Range Status   Specimen Description PLEURAL  Final   Special Requests NONE  Final  Gram Stain   Final    CYTOSPIN SMEAR WBC PRESENT, PREDOMINANTLY MONONUCLEAR NO ORGANISMS SEEN Performed at [facility]    Report Status 04/20/2015 FINAL  Final  Culture, body fluid-bottle     Status: None   Collection Time: 04/19/15  2:30 PM  Result Value Ref Range Status   Specimen Description PLEURAL  Final   Special Requests NONE  Final   Culture NO GROWTH 5 DAYS  Final   Report Status 04/24/2015 FINAL  Final    Medical History: Past Medical History  Diagnosis Date  . CAD (coronary artery disease)     Multivessel status post stent to LAD 1998 then CABG 2003  . Myocardial infarction (HCC)   . History of stroke 41997  . Type 2 diabetes mellitus (HCC)    . Essential hypertension   . Dyslipidemia   . Migraine headache   . GERD (gastroesophageal reflux disease)   . Anxiety   . Cervical disc disease     Medications:  Cefepime 2 Gm IV on 04/27/15 at 0030  Assessment: 74 yo male s/p recent hospitalization for COPD, CAP and respiratory failure; s/p discharge 04/26/15 to SNF; returned to ED due to altered mental status and possible PNA/sepsis. Empiric antibiotics for HCAP.  Goal of Therapy:  Vancomycin troughs 15-20 mcg/ml  Plan:  Preliminary review of pertinent patient information completed.  Protocol will be initiated with a one-time dose of Vancomycin 750 mg IV.  Jeani HawkingAnnie Penn clinical pharmacist will complete review during morning rounds to assess patient and finalize treatment regimen.  Arelia SneddonMason, Kaylean Tupou Anne, [facility] 04/27/2015,5:42 AM

## 2015-04-27 NOTE — Discharge Summary (Addendum)
Physician Discharge Summary  Miguel Mendoza ZOX:[id number] DOB: 08-25-1940 DOA: 04/26/2015  PCP: [name], MD  Admit date: 04/26/2015 Discharge date: 04/27/2015  Time spent: 45 minutes  Recommendations for Outpatient Follow-up:  -Will be discharged back to nursing facility today. -As stated on prior discharge summary, recommend repeat sodium level in 3 days. Encourage by mouth fluid intake as much as possible. At high risk for recurrent hypernatremia if unable to take sufficient oral fluids. -Recommend repeat chest x-ray in 4-6 weeks to ensure complete resolution of pneumonia. -Patient should wear C Pap machine at nighttime. -Recommend palliative care to follow at skilled nursing facility.  Discharge Diagnoses:  Principal Problem:   Encephalopathy acute, due to hypercarbia Active Problems:   Diabetes (HCC)   Pure hypercholesterolemia   Essential hypertension   Acute on chronic respiratory failure with hypercapnia (HCC)   COPD (chronic obstructive pulmonary disease) (HCC)   Discharge Condition: Stable, prognosis is guarded  [facility] Weights   04/26/15 2306 04/27/15 0500 04/27/15 0603  Weight: 73.483 kg (162 lb) 74.4 kg (164 lb 0.4 oz) 74.7 kg (164 lb 10.9 oz)    History of present illness:  74 yo male h/o copd, chf, recent hospitalization for pna/intubated for about a week discharged yesterday to SNF sent back in for confusion, altered mental status. Wife was with him in ED, confirmed him to be DNR. Pt was placed on bipap for his confusion and h/o copd. Pt responds to painful stimuli, but does not easily arouse on bipap. His pco2 is more elevated than his baseline which is normal, around mid 60. No report of fever at SNF. Unclear what his baseline is, but per daily notes does not appear to be very good. Referred for admission for his ams, possible sepsis/pna. All history obtained from EDP and chart.  Hospital Course:   Patient had a recent prolonged  hospitalization and had just been discharged from the hospital yesterday 12/30 to skilled nursing facility. Apparently while being assessed by his RN at skilled nursing facility he was noted to be "confused" and was sent back to the hospital. He was placed on broad-spectrum antibiotics given infiltrate on chest x-ray and on BiPAP machine given his hypercapnia. [name] was intubated for almost 2 weeks earlier this month and since then has had tenuous respiratory status. When he was extubated, this was not performed under ideal circumstances and wife agreed to DO NOT RESUSCITATE status. Patient was placed on BiPAP overnight. This morning he is awake and conversant. His mental status will wax and wane. I suspect that a small degree of CO2 retention above his baseline makes him more confused, because of this I would recommend a C Pap machine to be used at nighttime. Chest x-ray looks unchanged from prior, he completed 12 days of antibiotics during prior hospitalization do not believe he needs further antibiotics at this point. I strongly recommend palliative care to follow patient at skilled nursing facility given his poor long-term prognosis from his respiratory status and his and his wife's desire for DO NOT RESUSCITATE.   Please refer to discharge summary dictated 12/30 for further details of his prolonged prior hospital course.  Procedures:  None   Consultations:  None  Discharge Instructions  Discharge Instructions    Diet - low sodium heart healthy    Complete by:  As directed      Increase activity slowly    Complete by:  As directed  Medication List    STOP taking these medications        clonazePAM 1 MG tablet  Commonly known as:  KLONOPIN     clopidogrel 75 MG tablet  Commonly known as:  PLAVIX      TAKE these medications        amLODipine 10 MG tablet  Commonly known as:  NORVASC  Take 10 mg by mouth daily.     atorvastatin 20 MG tablet  Commonly known  as:  LIPITOR  Take 20 mg by mouth daily.     esomeprazole 40 MG capsule  Commonly known as:  NEXIUM  Take 40 mg by mouth daily before breakfast.     furosemide 40 MG tablet  Commonly known as:  LASIX  Take 1 tablet (40 mg total) by mouth daily.     gabapentin 300 MG capsule  Commonly known as:  NEURONTIN  Take 300 mg by mouth 3 (three) times daily.     glimepiride 2 MG tablet  Commonly known as:  AMARYL  Take 2 mg by mouth daily.     metFORMIN 500 MG tablet  Commonly known as:  GLUCOPHAGE  Take 1,000 mg by mouth 2 (two) times daily with a meal.     metoprolol tartrate 25 MG tablet  Commonly known as:  LOPRESSOR  Take 1 tablet (25 mg total) by mouth 3 (three) times daily.     nitroGLYCERIN 0.4 MG SL tablet  Commonly known as:  NITROSTAT  Place 0.4 mg under the tongue every 5 (five) minutes as needed.     nystatin-triamcinolone cream  Commonly known as:  MYCOLOG II  Apply topically 4 (four) times daily.       Allergies  Allergen Reactions  . Ibuprofen Hives      The results of significant diagnostics from this hospitalization (including imaging, microbiology, ancillary and laboratory) are listed below for reference.    Significant Diagnostic Studies: Ct Head Wo Contrast  04/11/2015  CLINICAL DATA:  Altered mental status.  Evaluate for bleed. EXAM: CT HEAD WITHOUT CONTRAST TECHNIQUE: Contiguous axial images were obtained from the base of the skull through the vertex without intravenous contrast. COMPARISON:  02/07/2015 FINDINGS: Sinuses/Soft tissues: Mild motion degradation. Clear paranasal sinuses and mastoid air cells. Intracranial: Cerebral atrophy. 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 Mild low density in the periventricular white matter likely related to small vessel disease. Vertebral and carotid atherosclerosis. No mass lesion, hemorrhage, hydrocephalus, acute infarct, intra-axial, or extra-axial fluid collection. IMPRESSION: 1.  No acute  intracranial abnormality. 2. Motion degradation. 3.  Cerebral atrophy and small vessel ischemic change. 4. 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 Electronically Signed   By: Jeronimo Greaves M.D.   On: 04/11/2015 22:28   Ct Chest Wo Contrast  04/18/2015  CLINICAL DATA:  Bilateral pleural effusions. Hypoxic respiratory failure. Pneumonia. EXAM: CT CHEST WITHOUT CONTRAST TECHNIQUE: Multidetector CT imaging of the chest was performed following the standard protocol without IV contrast. COMPARISON:  04/12/2015 FINDINGS: Endotracheal tube is in adequate position. Right-sided PICC line as tip within the SVC. Enteric tube with tip over the mid stomach. Lungs are adequately inflated and demonstrate interval improvement of a moderate size right pleural effusion with associated compressive atelectasis within the right base. Slight worsening consolidation of the posterior medial left base likely atelectasis with tiny amount of associated pleural fluid. Subtle hazy attenuation in the perihilar regions suggesting minimal interstitial edema. Tiny calcified granuloma over the lateral left upper lobe. Airways are  within normal. There is mild stable cardiomegaly. There is calcified plaque over the coronary arteries and thoracic aorta. Median sternotomy wires are present. There has been a slight decrease in size of the precarinal lymph node which now measures 1 cm likely reactive. No evidence of hilar or axillary adenopathy. Remaining mediastinal structures are within normal. Images through the upper abdomen are unchanged. Mild degenerate change of the spine. IMPRESSION: Mild interval decrease in size of a moderate size right pleural effusion with associated compressive atelectasis in the right base. Slight worsening consolidation over the posterior medial left base likely atelectasis with tiny amount of pleural fluid. Cardiomegaly with findings suggesting subtle interstitial edema. Electronically Signed   By:  Elberta Fortis M.D.   On: 04/18/2015 13:50   Ct Chest Wo Contrast  04/12/2015  CLINICAL DATA:  Respiratory failure. EXAM: CT CHEST WITHOUT CONTRAST TECHNIQUE: Multidetector CT imaging of the chest was performed following the standard protocol without IV contrast. COMPARISON:  02/07/2015 FINDINGS: Mediastinum: Previous median sternotomy and CABG procedure. Moderate cardiac enlargement. Aortic atherosclerosis noted. The trachea is patent and midline. The ET tube tip is above the carina. OG tube appears scratch set there is a nasogastric tube within a normal appearing esophagus. Prominent mediastinal nodes are identified. Index right paratracheal lymph node measures 1.4 cm, image 23/ series 2. Unchanged from previous exam. No axillary or supraclavicular adenopathy. Lungs/Pleura: Moderate to large right pleural effusion identified. There is airspace consolidation involving the entire right lower lobe. This appears progressive when compared with previous exam. Small left pleural effusion with posterior medial consolidation is identified. Upper Abdomen: The adrenal glands are normal. Normal appearance of the liver and spleen. The visualized portions of the pancreas appear normal. Musculoskeletal: Spondylosis noted within the thoracic spine. No aggressive lytic or sclerotic bone lesions. IMPRESSION: 1. Interval increase in volume of right pleural effusion with complete consolidation of the right lower lobe. 2. New posterior medial consolidation of the left lower lobe. 3. Cardiac enlargement and aortic atherosclerosis. 4. Enlarged right paratracheal lymph node. Similar to previous exam. Electronically Signed   By: Signa Kell M.D.   On: 04/12/2015 16:31   [place] Chest  04/12/2015  CLINICAL DATA:  Pleural effusion. EXAM: CHEST ULTRASOUND COMPARISON:  Chest x-ray 04/11/2015. FINDINGS: Small right pleural effusion is noted. Patient could not tolerate positioning for evaluation. Given the small size of the effusion and  difficulty in positioning patient, thoracentesis not performed. IMPRESSION: Small pleural effusion.  Thoracentesis not performed. Electronically Signed   By: Maisie Fus  Register   On: 04/12/2015 10:29   Dg Chest Port 1 View  04/27/2015  CLINICAL DATA:  Acute onset of altered mental status. Initial encounter. EXAM: PORTABLE CHEST 1 VIEW COMPARISON:  Chest radiograph performed 04/25/2015 FINDINGS: The lungs are well-aerated. Vascular congestion is noted. Patchy bilateral central airspace opacities may reflect mild pulmonary edema or pneumonia. No pleural effusion or pneumothorax is seen. The cardiomediastinal silhouette is borderline normal in size. The patient is status post median sternotomy, with evidence of prior CABG. No acute osseous abnormalities are seen. IMPRESSION: Vascular congestion. Patchy bilateral central airspace opacities may reflect mild pulmonary edema or pneumonia. Electronically Signed   By: Roanna Raider M.D.   On: 04/27/2015 00:11   Dg Chest Port 1 View  04/25/2015  CLINICAL DATA:  PICC placement EXAM: PORTABLE CHEST 1 VIEW COMPARISON:  04/21/2015 chest radiograph. FINDINGS: Right PICC terminates in the upper third of the superior vena cava. Median sternotomy wires are aligned and intact. Partially visualized is  surgical hardware overlying the lower cervical spine. Stable cardiomediastinal silhouette with mild cardiomegaly. No pneumothorax. Stable small left pleural effusion. No right pleural effusion. Mild pulmonary edema, slightly increased. Mild patchy bibasilar lung opacities, favor atelectasis. IMPRESSION: 1. Mild congestive heart failure, slightly increased pulmonary edema. 2. Stable small left pleural effusion. 3. Stable patchy bibasilar lung opacities, favor atelectasis. 4. Right PICC terminates in the upper third of the superior vena cava. Electronically Signed   By: Delbert Phenix M.D.   On: 04/25/2015 12:24   Dg Chest Port 1 View  04/21/2015  CLINICAL DATA:  Respiratory  failure EXAM: PORTABLE CHEST 1 VIEW COMPARISON:  04/19/2015 FINDINGS: The ET tube tip is above the carina. There is a a right arm PICC line with tip in the cavoatrial junction. Heart size is normal. There is a moderate left pleural effusion identified no right pleural effusion. Diminished aeration to the left lung base noted. IMPRESSION: 1. Decreased aeration to the left lung base with associated left pleural effusion. 2. Stable support apparatus. Electronically Signed   By: Signa Kell M.D.   On: 04/21/2015 09:04   Dg Chest Port 1 View  04/19/2015  CLINICAL DATA:  RIGHT pleural effusion post thoracentesis EXAM: PORTABLE CHEST 1 VIEW COMPARISON:  Portable exam 1412 hours compared to 0602 hours FINDINGS: Decrease in RIGHT pleural effusion and RIGHT basilar atelectasis versus earlier study. No definite pneumothorax. Stable endotracheal tube, nasogastric tube and RIGHT arm PICC line. Enlargement of cardiac silhouette post CABG. Atelectasis and effusion at LEFT base remain. Atherosclerotic calcification aorta. IMPRESSION: No pneumothorax following RIGHT thoracentesis. Electronically Signed   By: Ulyses Southward M.D.   On: 04/19/2015 14:37   Dg Chest Port 1 View  04/19/2015  CLINICAL DATA:  Ventilator.  Respiratory failure EXAM: PORTABLE CHEST 1 VIEW COMPARISON:  04/17/2015 FINDINGS: Prior CABG. There is cardiomegaly. Bilateral lower lobe opacities with layering effusions. Some improvement in aeration at the right lung base since prior study. Otherwise no change. IMPRESSION: Some improvement in aeration at the right lung base. Otherwise no change. Electronically Signed   By: Charlett Nose M.D.   On: 04/19/2015 08:11   Dg Chest Port 1 View  04/17/2015  CLINICAL DATA:  Respiratory failure. EXAM: PORTABLE CHEST 1 VIEW COMPARISON:  04/16/2015 . FINDINGS: Endotracheal tube, NG tube, right PICC line in stable position. Prior CABG. Cardiomegaly with pulmonary venous congestion. Bilateral lower lobe infiltrates and or  edema noted. Bilateral pleural effusions noted. Findings suggest congestive heart failure. Bibasilar pneumonia cannot be excluded. Low lung volumes with basilar atelectasis . No pneumothorax . Prior cervical spine fusion . IMPRESSION: 1. Lines and tubes in stable position. 2. Prior CABG. Cardiomegaly with pulmonary venous congestion. Bibasilar pulmonary infiltrates and or edema noted. Bilateral pleural effusions noted. Findings consistent with congestive heart failure. No significant change from prior exam. 3. Lung volumes with basilar atelectasis. Electronically Signed   By: Maisie Fus  Register   On: 04/17/2015 07:55   Dg Chest Port 1 View  04/16/2015  CLINICAL DATA:  Altered mental status, respiratory failure EXAM: PORTABLE CHEST 1 VIEW COMPARISON:  04/15/2015 FINDINGS: Endotracheal tube, NG tube and RIGHT PICC line are unchanged. There are low lung volumes and bilateral pleural effusions unchanged. Mild central venous congestion. No pneumothorax. IMPRESSION: 1. Stable support apparatus. 2. Low lung volumes with bilateral pleural effusions with no significant change. Electronically Signed   By: Genevive Bi M.D.   On: 04/16/2015 08:47   Dg Chest Port 1 View  04/15/2015  CLINICAL DATA:  Shortness of breath. EXAM: PORTABLE CHEST 1 VIEW COMPARISON:  04/14/2015.  CT 04/12/2015. FINDINGS: Endotracheal tube and right PICC line stable position PICC line in stable position. Prior CABG. Cardiomegaly with pulmonary vascular prominence and interstitial prominence suggesting mild congestive heart failure. Persistent right mid and lower lung infiltrate. Small bilateral pleural effusions. No pneumothorax . IMPRESSION: 1. Endotracheal tube and right PICC line in stable position. 2. Prior CABG. Stable cardiomegaly. Mild pulmonary vascular prominence interstitial prominence noted suggesting mild congestive heart failure. Small bilateral pleural effusions noted. 3. Set right mid lung and lower lung infiltrate.  Electronically Signed   By: Maisie Fus  Register   On: 04/15/2015 07:43   Dg Chest Port 1 View  04/14/2015  CLINICAL DATA:  Acute respiratory failure, disorientation. EXAM: PORTABLE CHEST 1 VIEW COMPARISON:  Chest x-ray and chest CT dated 04/12/2015, chest x-ray dated 04/11/2015 and chest x-ray dated 02/07/2015. FINDINGS: Endotracheal tube appears well positioned with tip just above the level of the carina. Enteric tube passes below the diaphragm. A right subclavian central line is better seen on previous chest CT, presumably obscured on this chest x-ray by the numerous lines overlying the right chest and mediastinum. Lung aeration is improved bilaterally. Suspect decreased right pleural effusion. Persistent dense opacities at each lung base compatible with the atelectasis identified on earlier chest CT. IMPRESSION: Improved aeration of both lungs indicating improved fluid status. Suspect at least some decrease in the size of the right pleural effusion. Probably stable atelectasis at each lung base. Tubes and lines grossly stable in position. Electronically Signed   By: Bary Richard M.D.   On: 04/14/2015 11:03   Dg Chest Port 1 View  04/12/2015  CLINICAL DATA:  Endotracheal tube placement. EXAM: PORTABLE CHEST 1 VIEW COMPARISON:  April 11, 2015. FINDINGS: Stable cardiomegaly. Status post coronary artery bypass graft. Patient is rotated to the right. Mild central pulmonary vascular congestion is noted. Moderate right pleural effusion is again noted with probable underlying atelectasis or infiltrate. No pneumothorax is noted. Endotracheal tube is seen projected over tracheal air shadow with distal tip at least 2 cm above the carina. Nasogastric tube is seen entering the stomach. IMPRESSION: Endotracheal tube appears to be in grossly good position. Stable cardiomegaly and central pulmonary vascular congestion is noted. Stable moderate right pleural effusion with probable underlying atelectasis or infiltrate.  Electronically Signed   By: Lupita Raider, M.D.   On: 04/12/2015 11:04   Dg Chest Portable 1 View  04/11/2015  CLINICAL DATA:  Hypoxia.  Cough. EXAM: PORTABLE CHEST 1 VIEW COMPARISON:  February 07, 2015. FINDINGS: Stable cardiomegaly. Status post coronary artery bypass graft. No pneumothorax is noted. Mild central pulmonary vascular congestion is again noted and stable. Mild left pleural effusion is noted. Moderate right pleural effusion is noted which is increased compared to prior exam, with probable underlying atelectasis or infiltrate. Bony thorax is unremarkable. IMPRESSION: Stable mild left pleural effusion. Significantly increased moderate right pleural effusion with probable underlying atelectasis or infiltrate. Electronically Signed   By: Lupita Raider, M.D.   On: 04/11/2015 18:31   Dg Chest Port 1v Same Day  04/16/2015  CLINICAL DATA:  Check OG placement EXAM: PORTABLE CHEST - 1 VIEW SAME DAY COMPARISON:  04/16/2015 FINDINGS: Endotracheal tube is again identified in satisfactory position. A right-sided PICC line is noted at cavoatrial junction. A orogastric catheter is seen extending into the stomach. Bilateral pleural effusions and bibasilar atelectatic changes are again seen. No acute bony abnormality is noted. IMPRESSION: Tubes  and lines as described in satisfactory position. Stable bibasilar changes. Electronically Signed   By: Alcide CleverMark  Lukens M.D.   On: 04/16/2015 20:36   [place] Thoracentesis Asp Pleural Space W/img Guide  04/19/2015  CLINICAL DATA:  RIGHT pleural effusion, RIGHT lower lobe atelectasis, high oxygen requirements, on ventilator EXAM: ULTRASOUND GUIDED DIAGNOSTIC AND THERAPEUTIC RIGHT THORACENTESIS COMPARISON:  Chest CT 04/17/2014 PROCEDURE: Procedure, benefits, and risks of procedure were discussed with patient's wife. Written informed consent for procedure was obtained. Time out protocol followed. Procedure performed portably in ICU. Patient placed in an LPO projection. Pleural  effusion localized by ultrasound at the lateral RIGHT hemithorax. Skin prepped and draped in usual sterile fashion. Skin and soft tissues anesthetized with 10 mL of 1% lidocaine. Under direct sonographic visualization, 5 [place] catheter placed into the RIGHT pleural space. 270 mL of yellow RIGHT pleural fluid was aspirated by syringe. Due to limited positioning of patient for access of fluid, was unable to aspirate additional fluid. Procedure tolerated well by patient without immediate complication. COMPLICATIONS: None. FINDINGS: A total of approximately 270 mL of RIGHT pleural fluid was removed. A fluid sample of 180 mL was sent for laboratory analysis. IMPRESSION: Successful ultrasound guided RIGHT thoracentesis yielding 270 mL of pleural fluid. Electronically Signed   By: Ulyses SouthwardMark  Boles M.D.   On: 04/19/2015 14:42    Microbiology: Recent Results (from the past 240 hour(s))  Culture, body fluid-bottle     Status: None   Collection Time: 04/19/15 12:22 PM  Result Value Ref Range Status   Specimen Description PLEURAL  Final   Special Requests NONE  Final   Culture   Final    NO GROWTH 5 DAYS [facility]    Report Status 04/25/2015 FINAL  Final  Gram stain     Status: None   Collection Time: 04/19/15 12:22 PM  Result Value Ref Range Status   Specimen Description PLEURAL  Final   Special Requests NONE  Final   Gram Stain   Final    CYTOSPIN SMEAR WBC PRESENT, PREDOMINANTLY MONONUCLEAR NO ORGANISMS SEEN Performed at [facility]    Report Status 04/20/2015 FINAL  Final  Culture, body fluid-bottle     Status: None   Collection Time: 04/19/15  2:30 PM  Result Value Ref Range Status   Specimen Description PLEURAL  Final   Special Requests NONE  Final   Culture NO GROWTH 5 DAYS  Final   Report Status 04/24/2015 FINAL  Final  MRSA PCR Screening     Status: None   Collection Time: 04/27/15  4:22 AM  Result Value Ref Range Status   MRSA by PCR NEGATIVE  NEGATIVE Final    Comment:        The GeneXpert MRSA Assay (FDA approved for NASAL specimens only), is one component of a comprehensive MRSA colonization surveillance program. It is not intended to diagnose MRSA infection nor to guide or monitor treatment for MRSA infections.      Labs: Basic Metabolic Panel:  Recent Labs Lab 04/23/15 2210 04/24/15 0430  04/24/15 1649 04/24/15 2248 04/25/15 0550 04/26/15 2355 04/27/15 0522  NA 155* 158*  < > 157* 154* 152* 148* 145  K 3.7 3.4*  --   --   --  4.0 5.0 5.4*  CL 115* 117*  --   --   --  119* 116* 117*  CO2 28 29  --   --   --  24 26 24   GLUCOSE 218*  219*  --   --   --  209* 135* 159*  BUN 128* 142*  --   --   --  104* 63* 67*  CREATININE 1.58* 1.39*  --   --   --  1.19 1.18 1.28*  CALCIUM 10.4* 10.2  --   --   --  10.2 10.2 10.1  MG 2.9*  --   --   --   --   --   --   --   < > = values in this interval not displayed. Liver Function Tests:  Recent Labs Lab 04/21/15 0842 04/26/15 2355  AST 21 14*  ALT 14* 18  ALKPHOS 50 60  BILITOT 0.8 1.4*  PROT 6.8 6.7  ALBUMIN 3.3* 3.0*   No results for input(s): LIPASE, AMYLASE in the last 168 hours. No results for input(s): AMMONIA in the last 168 hours. CBC:  Recent Labs Lab 04/21/15 0842 04/25/15 0550 04/26/15 2355 04/27/15 0522  WBC 9.8 12.9* 13.6* 12.6*  NEUTROABS 8.0*  --  12.1*  --   HGB 13.1 14.2 15.2 13.5  HCT 45.9 50.6 53.5* 48.8  MCV 90.0 92.2 92.6 92.4  PLT 171 128* 98* 104*   Cardiac Enzymes:  Recent Labs Lab 04/26/15 2355 04/27/15 0522 04/27/15 1027  TROPONINI 0.03 0.03 0.03   BNP: BNP (last 3 results)  Recent Labs  04/11/15 1812 04/12/15 0449 04/26/15 2355  BNP 422.0* 520.0* 149.0*    ProBNP (last 3 results) No results for input(s): PROBNP in the last 8760 hours.  CBG:  Recent Labs Lab 04/26/15 1153 04/26/15 2342 04/27/15 0557 04/27/15 0719 04/27/15 1211  GLUCAP 195* 111* 126* 158* 177*       Signed:  HERNANDEZ  ACOSTA,ESTELA  [facility] Pager: [phone] 04/27/2015, 12:31 PM

## 2015-04-27 NOTE — Progress Notes (Signed)
[facility] has concerns about patient returning back today. Discussed with the nursing supervisor. We have agreed to let him try C Pap in the hospital tonight and potentially pursue discharge in a.m. Patient will ultimately  benefit from continued palliative care discussions given his poor long-term prognosis.  Peggye PittEstela Hernandez, MD [facility] Pager: [phone]

## 2015-04-27 NOTE — ED Notes (Signed)
CRITICAL VALUE ALERT  Critical value received:  Ph: 7.23                                        pco2: 62.5                                        po2: 95.9                                        Sat 95.3                                        BiCarb 21.9 Date of notification: 04-27-2015  Time of notification: 0010  Critical value read back: yes  Nurse who received alert:  Tenna DelaineLori Kaegan Hettich, RN  MD notified (1st page):  Dr. Bebe ShaggyWickline  Time of first page:    MD notified (2nd page):  Time of second page:  Responding MD: Bebe ShaggyWickline  Time MD responded:  0010

## 2015-04-27 NOTE — ED Notes (Addendum)
Alvira PhilipsGeraldine Annice Pih(Jackie) wife [phone]  Call with any new updates

## 2015-04-27 NOTE — Progress Notes (Signed)
ANTIBIOTIC CONSULT NOTE-Initial  Pharmacy Consult for Vancomycin and Cefepime Indication: Pneumonia  Allergies  Allergen Reactions  . Ibuprofen Hives    Patient Measurements: Height: 6' (182.9 cm) Weight: 164 lb 10.9 oz (74.7 kg) IBW/kg (Calculated) : 77.6  Vital Signs: Temp: 98 F (36.7 C) (12/31 0751) Temp Source: Axillary (12/31 0751) BP: 93/57 mmHg (12/31 0741) Pulse Rate: 81 (12/31 0741)  Labs:  Recent Labs  04/25/15 0550 04/26/15 2355 04/27/15 0522  WBC 12.9* 13.6* 12.6*  HGB 14.2 15.2 13.5  PLT 128* 98* 104*  CREATININE 1.19 1.18 1.28*    Estimated Creatinine Clearance: 54.3 mL/min (by C-G formula based on Cr of 1.28).  No results for input(s): VANCOTROUGH, VANCOPEAK, VANCORANDOM, GENTTROUGH, GENTPEAK, GENTRANDOM, TOBRATROUGH, TOBRAPEAK, TOBRARND, AMIKACINPEAK, AMIKACINTROU, AMIKACIN in the last 72 hours.   Microbiology: Recent Results (from the past 720 hour(s))  MRSA PCR Screening     Status: None   Collection Time: 04/11/15 11:42 AM  Result Value Ref Range Status   MRSA by PCR NEGATIVE NEGATIVE Final    Comment:        The GeneXpert MRSA Assay (FDA approved for NASAL specimens only), is one component of a comprehensive MRSA colonization surveillance program. It is not intended to diagnose MRSA infection nor to guide or monitor treatment for MRSA infections.   Culture, blood (Routine X 2) w Reflex to ID Panel     Status: None   Collection Time: 04/13/15 10:02 AM  Result Value Ref Range Status   Specimen Description BLOOD LEFT ARM  Final   Special Requests BOTTLES DRAWN AEROBIC AND ANAEROBIC 6CC EACH  Final   Culture NO GROWTH 5 DAYS  Final   Report Status 04/18/2015 FINAL  Final  Culture, blood (Routine X 2) w Reflex to ID Panel     Status: None   Collection Time: 04/13/15 10:08 AM  Result Value Ref Range Status   Specimen Description BLOOD LEFT HAND  Final   Special Requests BOTTLES DRAWN AEROBIC AND ANAEROBIC 5CC EACH  Final   Culture NO  GROWTH 5 DAYS  Final   Report Status 04/18/2015 FINAL  Final  Culture, respiratory (NON-Expectorated)     Status: None   Collection Time: 04/14/15  9:32 PM  Result Value Ref Range Status   Specimen Description TRACHEAL ASPIRATE  Final   Special Requests NONE  Final   Gram Stain   Final    MODERATE WBC PRESENT, PREDOMINANTLY PMN NO SQUAMOUS EPITHELIAL CELLS SEEN MODERATE GRAM POSITIVE RODS Performed at [facility]    Culture   Final    NORMAL OROPHARYNGEAL FLORA Performed at [facility]    Report Status 04/17/2015 FINAL  Final  Culture, expectorated sputum-assessment     Status: None   Collection Time: 04/14/15  9:48 PM  Result Value Ref Range Status   Specimen Description ENDOTRACHEAL  Final   Special Requests NONE  Final   Sputum evaluation THIS SPECIMEN IS ACCEPTABLE FOR SPUTUM CULTURE  Final   Report Status 04/14/2015 FINAL  Final  Culture, body fluid-bottle     Status: None   Collection Time: 04/19/15 12:22 PM  Result Value Ref Range Status   Specimen Description PLEURAL  Final   Special Requests NONE  Final   Culture   Final    NO GROWTH 5 DAYS Performed at [facility]    Report Status 04/25/2015 FINAL  Final  Gram stain     Status: None   Collection Time: 04/19/15 12:22 PM  Result Value Ref Range Status   Specimen Description PLEURAL  Final   Special Requests NONE  Final   Gram Stain   Final    CYTOSPIN SMEAR WBC PRESENT, PREDOMINANTLY MONONUCLEAR NO ORGANISMS SEEN Performed [facility]    Report Status 04/20/2015 FINAL  Final  Culture, body fluid-bottle     Status: None   Collection Time: 04/19/15  2:30 PM  Result Value Ref Range Status   Specimen Description PLEURAL  Final   Special Requests NONE  Final   Culture NO GROWTH 5 DAYS  Final   Report Status 04/24/2015 FINAL  Final    Medical History: Past Medical History  Diagnosis Date  . CAD (coronary artery disease)     Multivessel status post stent to LAD 1998  then CABG 2003  . Myocardial infarction (HCC)   . History of stroke 271997  . Type 2 diabetes mellitus (HCC)   . Essential hypertension   . Dyslipidemia   . Migraine headache   . GERD (gastroesophageal reflux disease)   . Anxiety   . Cervical disc disease     Medications:  Cefepime 2 Gm IV on 04/27/15 at 0030 Vancomycin 750mg  at 04/27/15 0530  Assessment: 74 yo male s/p recent hospitalization for COPD, CAP and respiratory failure; s/p discharge 04/26/15 to SNF; returned to ED due to altered mental status and possible PNA/sepsis. Empiric antibiotics for HCAP. CBC essentially the same. Afebrile. Hypotensive  Goal of Therapy:  Vancomycin troughs 15-20 mcg/ml  Plan:  Cefepime 1gm IV q12h Vancomycin 750mg  iv q12h Check levels at steady state F/U cultures Monitor labs, renal function  Elder CyphersLorie Aminah Zabawa, BS Loura BackPharm D, BCPS Clinical Pharmacist Pager [phone]   04/27/2015,8:23 AM

## 2015-04-27 NOTE — H&P (Signed)
PCP:   [name],ASHISH, MD   Chief Complaint:  ams  HPI: 74 yo male h/o copd, chf, recent hospitalization for pna/intubated for about a week discharged yesterday to SNF sent back in for confusion, altered mental status.  Wife was with him in ED, confirmed him to be DNR.  Pt was placed on bipap for his confusion and h/o copd.  Pt responds to painful stimuli, but does not easily arouse on bipap.  His pco2 is more elevated than his baseline which is normal, around mid 60.  No report of fever at SNF.  Unclear what his baseline is, but per daily notes does not appear to be very good.  Referred for admission for his ams, possible sepsis/pna.  All history obtained from EDP and chart.  Review of Systems:  Unobtainable from pt due to encephalopathy  Past Medical History: Past Medical History  Diagnosis Date  . CAD (coronary artery disease)     Multivessel status post stent to LAD 1998 then CABG 2003  . Myocardial infarction (HCC)   . History of stroke 581997  . Type 2 diabetes mellitus (HCC)   . Essential hypertension   . Dyslipidemia   . Migraine headache   . GERD (gastroesophageal reflux disease)   . Anxiety   . Cervical disc disease    Past Surgical History  Procedure Laterality Date  . Coronary artery bypass graft  2003    Dr. Cornelius Moraswen - [place] to LAD, SVG to OM, SVG to RCA  . Carotid endarterectomy Left   . Back surgery    . Anterior cervical decomp/discectomy fusion      Medications: Prior to Admission medications   Medication Sig Start Date End Date Taking? Authorizing Provider  amLODipine (NORVASC) 10 MG tablet Take 10 mg by mouth daily.    Historical Provider, MD  atorvastatin (LIPITOR) 20 MG tablet Take 20 mg by mouth daily.    Historical Provider, MD  clonazePAM (KLONOPIN) 1 MG tablet Take 1 mg by mouth 2 (two) times daily.    Historical Provider, MD  clopidogrel (PLAVIX) 75 MG tablet Take 75 mg by mouth daily.    Historical Provider, MD  esomeprazole (NEXIUM) 40 MG capsule Take 40  mg by mouth daily before breakfast.    Historical Provider, MD  furosemide (LASIX) 40 MG tablet Take 0.5-1 tablets (20-40 mg total) by mouth every other day. Alternated a half pill with whole pill every other day Patient taking differently: Take 40 mg by mouth daily.  03/14/15   Jonelle SidleSamuel G McDowell, MD  gabapentin (NEURONTIN) 300 MG capsule Take 300 mg by mouth 3 (three) times daily.    Historical Provider, MD  glimepiride (AMARYL) 2 MG tablet Take 2 mg by mouth daily.     Historical Provider, MD  metFORMIN (GLUCOPHAGE) 500 MG tablet Take 1,000 mg by mouth 2 (two) times daily with a meal.    Historical Provider, MD  metoprolol tartrate (LOPRESSOR) 25 MG tablet Take 1 tablet (25 mg total) by mouth 3 (three) times daily. 04/26/15   Henderson CloudEstela Y Hernandez Acosta, MD  nitroGLYCERIN (NITROSTAT) 0.4 MG SL tablet Place 0.4 mg under the tongue every 5 (five) minutes as needed.    Historical Provider, MD  nystatin-triamcinolone (MYCOLOG II) cream Apply topically 4 (four) times daily. 04/26/15   Henderson CloudEstela Y Hernandez Acosta, MD    Allergies:   Allergies  Allergen Reactions  . Ibuprofen Hives    Social History:  reports that he has been smoking Cigarettes.  He has  been smoking about 0.50 packs per day. He does not have any smokeless tobacco history on file. He reports that he does not drink alcohol or use illicit drugs.  Family History: Family History  Problem Relation Age of Onset  . Heart attack Father   . Heart attack Mother   . Heart attack Brother     Physical Exam: Filed Vitals:   04/27/15 0100 04/27/15 0115 04/27/15 0130 04/27/15 0200  BP: 94/71  80/57 89/63  Pulse: 99 95 91 94  Resp: Height:      Weight:      SpO2: 92% 91% 89% 92%   General appearance: mild distress and slowed mentation appear dry Head: Normocephalic, without obvious abnormality, atraumatic Eyes: negative Nose: Nares normal. Septum midline. Mucosa normal. No drainage or sinus tenderness. Neck: no JVD and  supple, symmetrical, trachea midline Lungs: diminished breath sounds LLL and LUL Heart: regular rate and rhythm, S1, S2 normal, no murmur, click, rub or gallop Abdomen: soft, non-tender; bowel sounds normal; no masses,  no organomegaly Extremities: extremities normal, atraumatic, no cyanosis or edema Pulses: 2+ and symmetric Skin: Skin color, texture, turgor normal. No rashes or lesions Neurologic: Mental status: alertness: lethargic responds to painful stimuli.  Moves extremeties minimal, does not follow commands.      Labs on Admission:   Recent Labs  04/25/15 0550 04/26/15 2355  NA 152* 148*  K 4.0 5.0  CL 119* 116*  CO2 24 26  GLUCOSE 209* 135*  BUN 104* 63*  CREATININE 1.19 1.18  CALCIUM 10.2 10.2    Recent Labs  04/26/15 2355  AST 14*  ALT 18  ALKPHOS 60  BILITOT 1.4*  PROT 6.7  ALBUMIN 3.0*    Recent Labs  04/25/15 0550 04/26/15 2355  WBC 12.9* 13.6*  NEUTROABS  --  12.1*  HGB 14.2 15.2  HCT 50.6 53.5*  MCV 92.2 92.6  PLT 128* 98*    Recent Labs  04/26/15 2355  TROPONINI 0.03   Radiological Exams on Admission: Dg Chest Port 1 View  04/27/2015  CLINICAL DATA:  Acute onset of altered mental status. Initial encounter. EXAM: PORTABLE CHEST 1 VIEW COMPARISON:  Chest radiograph performed 04/25/2015 FINDINGS: The lungs are well-aerated. Vascular congestion is noted. Patchy bilateral central airspace opacities may reflect mild pulmonary edema or pneumonia. No pleural effusion or pneumothorax is seen. The cardiomediastinal silhouette is borderline normal in size. The patient is status post median sternotomy, with evidence of prior CABG. No acute osseous abnormalities are seen. IMPRESSION: Vascular congestion. Patchy bilateral central airspace opacities may reflect mild pulmonary edema or pneumonia. Electronically Signed   By: Roanna Raider M.D.   On: 04/27/2015 00:11   Dg Chest Port 1 View  04/25/2015  CLINICAL DATA:  PICC placement EXAM: PORTABLE CHEST 1  VIEW COMPARISON:  04/21/2015 chest radiograph. FINDINGS: Right PICC terminates in the upper third of the superior vena cava. Median sternotomy wires are aligned and intact. Partially visualized is surgical hardware overlying the lower cervical spine. Stable cardiomediastinal silhouette with mild cardiomegaly. No pneumothorax. Stable small left pleural effusion. No right pleural effusion. Mild pulmonary edema, slightly increased. Mild patchy bibasilar lung opacities, favor atelectasis. IMPRESSION: 1. Mild congestive heart failure, slightly increased pulmonary edema. 2. Stable small left pleural effusion. 3. Stable patchy bibasilar lung opacities, favor atelectasis. 4. Right PICC terminates in the upper third of the superior vena cava. Electronically Signed   By: Delbert Phenix M.D.   On: 04/25/2015 12:24  Dg Chest Port 1 View  04/21/2015  CLINICAL DATA:  Respiratory failure EXAM: PORTABLE CHEST 1 VIEW COMPARISON:  04/19/2015 FINDINGS: The ET tube tip is above the carina. There is a a right arm PICC line with tip in the cavoatrial junction. Heart size is normal. There is a moderate left pleural effusion identified no right pleural effusion. Diminished aeration to the left lung base noted. IMPRESSION: 1. Decreased aeration to the left lung base with associated left pleural effusion. 2. Stable support apparatus. Electronically Signed   By: Signa Kell M.D.   On: 04/21/2015 09:04   US Thoracentesis Asp Pleural Space W/img Guide  04/19/2015  CLINICAL DATA:  RIGHT pleural effusion, RIGHT lower lobe atelectasis, high oxygen requirements, on ventilator EXAM: ULTRASOUND GUIDED DIAGNOSTIC AND THERAPEUTIC RIGHT THORACENTESIS COMPARISON:  Chest CT 04/17/2014 PROCEDURE: Procedure, benefits, and risks of procedure were discussed with patient's wife. Written informed consent for procedure was obtained. Time out protocol followed. Procedure performed portably in ICU. Patient placed in an LPO projection. Pleural effusion  localized by ultrasound at the lateral RIGHT hemithorax. Skin prepped and draped in usual sterile fashion. Skin and soft tissues anesthetized with 10 mL of 1% lidocaine. Under direct sonographic visualization, 5 [place] Yueh catheter placed into the RIGHT pleural space. 270 mL of yellow RIGHT pleural fluid was aspirated by syringe. Due to limited positioning of patient for access of fluid, was unable to aspirate additional fluid. Procedure tolerated well by patient without immediate complication. COMPLICATIONS: None. FINDINGS: A total of approximately 270 mL of RIGHT pleural fluid was removed. A fluid sample of 180 mL was sent for laboratory analysis. IMPRESSION: Successful ultrasound guided RIGHT thoracentesis yielding 270 mL of pleural fluid. Electronically Signed   By: Ulyses Southward M.D.   On: 04/19/2015 14:42   Old chart reviewed cxr with mild edema vs bilateral pna Case discusssed with dr Bebe Shaggy edp ekg reviewed sinus tach, pvc, no acute issues  Assessment/Plan  74 yo male with acute encephalopathy of unclear origin  Principal Problem:   Encephalopathy acute- may be due to mild hypercapnia, but also could be due to infection with his recent history.  Cont bipap and treat empirically for HCAP.  Reck abg now.  Check procalcitonin levels.  Lactic acid normal.  Will also check ct head to r/o major cva.  Active Problems:   Diabetes (HCC)- ssi, npo   Pure hypercholesterolemia- noted   Essential hypertension- hold bp meds, bp low at this time r/o sepsis, ivf bolus ordered   Acute on chronic respiratory failure with hypercapnia (HCC)- bipap, recheck abg now   COPD (chronic obstructive pulmonary disease) (HCC) place on steroids, broad abx  Admit to stepdown.  DNR paperwork from SNF at bedside.  Griffon Herberg A 04/27/2015, 2:29 AM

## 2015-04-28 LAB — GLUCOSE, CAPILLARY
GLUCOSE-CAPILLARY: 110 mg/dL — AB (ref 65–99)
Glucose-Capillary: 112 mg/dL — ABNORMAL HIGH (ref 65–99)
Glucose-Capillary: 99 mg/dL (ref 65–99)
Glucose-Capillary: 138 mg/dL — ABNORMAL HIGH (ref 65–99)

## 2015-04-28 LAB — BASIC METABOLIC PANEL
ANION GAP: 9 (ref 5–15)
BUN: 58 mg/dL — ABNORMAL HIGH (ref 6–20)
CALCIUM: 10.4 mg/dL — AB (ref 8.9–10.3)
CO2: 27 mmol/L (ref 22–32)
Chloride: 119 mmol/L — ABNORMAL HIGH (ref 101–111)
Creatinine, Ser: 1.11 mg/dL (ref 0.61–1.24)
GFR calc non Af Amer: >60 mL/min (ref >60)
Glucose, Bld: 130 mg/dL — ABNORMAL HIGH (ref 65–99)
Potassium: 4 mmol/L (ref 3.5–5.1)
Sodium: 155 mmol/L — ABNORMAL HIGH (ref 135–145)

## 2015-04-28 MED ORDER — ENSURE ENLIVE PO LIQD: 237.0000 mL | Freq: Two times a day (BID) | ORAL | Status: DC

## 2015-04-28 MED ORDER — OXYCODONE-ACETAMINOPHEN 7.5-325 MG PO TABS
1.0000 | ORAL_TABLET | ORAL | Status: DC | PRN
Start: 2015-04-28 — End: 2015-05-13

## 2015-04-28 NOTE — Discharge Summary (Signed)
Please refer to discharge summary dated 12/31 and 12/30 for further details. Patient was kept in the hospital an extra day to see how he tolerated C Pap machine overnight. He did well. Is currently awake eating lunch and conversing with family members. Will proceed with discharge back to SNF today.  Peggye PittEstela Hernandez, MD [facility] Pager: [phone]

## 2015-04-28 NOTE — Progress Notes (Signed)
May need to consider SLP eval.  Pt coughed a time or two swallowing liquids during dinner yesterday.  NT reported to me that he was coughing while swallowing food and drinking with breakfast this AM.

## 2015-04-28 NOTE — Progress Notes (Signed)
Patient taken off CPAP and placed back on nasal cannula at 6 lpm. RN notified.

## 2015-04-28 NOTE — Progress Notes (Signed)
[facility].  Pt in no distress at time of transport.  [facility]  Belongings given to pt's family prior to transport.

## 2015-04-29 ENCOUNTER — Encounter (HOSPITAL_COMMUNITY): Payer: Self-pay

## 2015-04-29 ENCOUNTER — Emergency Department (HOSPITAL_COMMUNITY): Payer: Medicare HMO

## 2015-04-29 ENCOUNTER — Observation Stay (HOSPITAL_COMMUNITY)
Admission: EM | Admit: 2015-04-29 | Discharge: 2015-05-02 | Disposition: A | Discharge status: Skilled Nursing Facility | Payer: Medicare HMO | Attending: Family Medicine | Admitting: Family Medicine

## 2015-04-29 ENCOUNTER — Non-Acute Institutional Stay (SKILLED_NURSING_FACILITY): Payer: Medicare HMO | Admitting: Internal Medicine

## 2015-04-29 DIAGNOSIS — I700 Atherosclerosis of aorta: Secondary | ICD-10-CM | POA: Insufficient documentation

## 2015-04-29 DIAGNOSIS — Z9911 Dependence on respirator [ventilator] status: Secondary | ICD-10-CM | POA: Diagnosis not present

## 2015-04-29 DIAGNOSIS — I493 Ventricular premature depolarization: Secondary | ICD-10-CM | POA: Diagnosis not present

## 2015-04-29 DIAGNOSIS — E119 Type 2 diabetes mellitus without complications: Secondary | ICD-10-CM | POA: Diagnosis not present

## 2015-04-29 DIAGNOSIS — F17210 Nicotine dependence, cigarettes, uncomplicated: Secondary | ICD-10-CM | POA: Insufficient documentation

## 2015-04-29 DIAGNOSIS — Z886 Allergy status to analgesic agent status: Secondary | ICD-10-CM | POA: Diagnosis not present

## 2015-04-29 DIAGNOSIS — I6529 Occlusion and stenosis of unspecified carotid artery: Secondary | ICD-10-CM | POA: Diagnosis not present

## 2015-04-29 DIAGNOSIS — I472 Ventricular tachycardia: Secondary | ICD-10-CM | POA: Diagnosis not present

## 2015-04-29 DIAGNOSIS — E860 Dehydration: Secondary | ICD-10-CM | POA: Diagnosis not present

## 2015-04-29 DIAGNOSIS — R410 Disorientation, unspecified: Secondary | ICD-10-CM

## 2015-04-29 DIAGNOSIS — Z515 Encounter for palliative care: Secondary | ICD-10-CM | POA: Diagnosis not present

## 2015-04-29 DIAGNOSIS — J189 Pneumonia, unspecified organism: Secondary | ICD-10-CM

## 2015-04-29 DIAGNOSIS — I480 Paroxysmal atrial fibrillation: Secondary | ICD-10-CM

## 2015-04-29 DIAGNOSIS — Z7984 Long term (current) use of oral hypoglycemic drugs: Secondary | ICD-10-CM | POA: Insufficient documentation

## 2015-04-29 DIAGNOSIS — M47894 Other spondylosis, thoracic region: Secondary | ICD-10-CM | POA: Insufficient documentation

## 2015-04-29 DIAGNOSIS — I252 Old myocardial infarction: Secondary | ICD-10-CM | POA: Insufficient documentation

## 2015-04-29 DIAGNOSIS — E785 Hyperlipidemia, unspecified: Secondary | ICD-10-CM | POA: Insufficient documentation

## 2015-04-29 DIAGNOSIS — I714 Abdominal aortic aneurysm, without rupture: Secondary | ICD-10-CM | POA: Diagnosis not present

## 2015-04-29 DIAGNOSIS — J948 Other specified pleural conditions: Secondary | ICD-10-CM | POA: Diagnosis not present

## 2015-04-29 DIAGNOSIS — I110 Hypertensive heart disease with heart failure: Secondary | ICD-10-CM | POA: Insufficient documentation

## 2015-04-29 DIAGNOSIS — I5033 Acute on chronic diastolic (congestive) heart failure: Secondary | ICD-10-CM | POA: Insufficient documentation

## 2015-04-29 DIAGNOSIS — I10 Essential (primary) hypertension: Secondary | ICD-10-CM

## 2015-04-29 DIAGNOSIS — E870 Hyperosmolality and hypernatremia: Secondary | ICD-10-CM | POA: Diagnosis not present

## 2015-04-29 DIAGNOSIS — N179 Acute kidney failure, unspecified: Secondary | ICD-10-CM

## 2015-04-29 DIAGNOSIS — J918 Pleural effusion in other conditions classified elsewhere: Secondary | ICD-10-CM

## 2015-04-29 DIAGNOSIS — L8990 Pressure ulcer of unspecified site, unspecified stage: Secondary | ICD-10-CM | POA: Insufficient documentation

## 2015-04-29 DIAGNOSIS — J90 Pleural effusion, not elsewhere classified: Secondary | ICD-10-CM | POA: Diagnosis not present

## 2015-04-29 DIAGNOSIS — Z8673 Personal history of transient ischemic attack (TIA), and cerebral infarction without residual deficits: Secondary | ICD-10-CM | POA: Diagnosis not present

## 2015-04-29 DIAGNOSIS — J439 Emphysema, unspecified: Secondary | ICD-10-CM

## 2015-04-29 DIAGNOSIS — I672 Cerebral atherosclerosis: Secondary | ICD-10-CM | POA: Insufficient documentation

## 2015-04-29 DIAGNOSIS — G43909 Migraine, unspecified, not intractable, without status migrainosus: Secondary | ICD-10-CM | POA: Insufficient documentation

## 2015-04-29 DIAGNOSIS — E43 Unspecified severe protein-calorie malnutrition: Secondary | ICD-10-CM | POA: Insufficient documentation

## 2015-04-29 DIAGNOSIS — J9602 Acute respiratory failure with hypercapnia: Secondary | ICD-10-CM | POA: Diagnosis not present

## 2015-04-29 DIAGNOSIS — Z66 Do not resuscitate: Secondary | ICD-10-CM | POA: Insufficient documentation

## 2015-04-29 DIAGNOSIS — I471 Supraventricular tachycardia: Secondary | ICD-10-CM | POA: Diagnosis not present

## 2015-04-29 DIAGNOSIS — I25110 Atherosclerotic heart disease of native coronary artery with unstable angina pectoris: Secondary | ICD-10-CM | POA: Insufficient documentation

## 2015-04-29 DIAGNOSIS — Z6823 Body mass index (BMI) 23.0-23.9, adult: Secondary | ICD-10-CM | POA: Insufficient documentation

## 2015-04-29 DIAGNOSIS — Z955 Presence of coronary angioplasty implant and graft: Secondary | ICD-10-CM | POA: Diagnosis not present

## 2015-04-29 DIAGNOSIS — J449 Chronic obstructive pulmonary disease, unspecified: Secondary | ICD-10-CM | POA: Diagnosis not present

## 2015-04-29 DIAGNOSIS — Z951 Presence of aortocoronary bypass graft: Secondary | ICD-10-CM | POA: Insufficient documentation

## 2015-04-29 DIAGNOSIS — F419 Anxiety disorder, unspecified: Secondary | ICD-10-CM | POA: Insufficient documentation

## 2015-04-29 DIAGNOSIS — K219 Gastro-esophageal reflux disease without esophagitis: Secondary | ICD-10-CM | POA: Diagnosis not present

## 2015-04-29 DIAGNOSIS — G319 Degenerative disease of nervous system, unspecified: Secondary | ICD-10-CM | POA: Insufficient documentation

## 2015-04-29 DIAGNOSIS — E7800 Pure hypercholesterolemia, unspecified: Secondary | ICD-10-CM | POA: Diagnosis not present

## 2015-04-29 DIAGNOSIS — Z7189 Other specified counseling: Secondary | ICD-10-CM | POA: Insufficient documentation

## 2015-04-29 LAB — URINE CULTURE: CULTURE: NO GROWTH

## 2015-04-29 LAB — COMPREHENSIVE METABOLIC PANEL
ALBUMIN: 2.5 g/dL — AB (ref 3.5–5.0)
ALK PHOS: 59 U/L (ref 38–126)
ALT: 21 U/L (ref 17–63)
ANION GAP: 8 (ref 5–15)
AST: 15 U/L (ref 15–41)
BUN: 43 mg/dL — ABNORMAL HIGH (ref 6–20)
CHLORIDE: 124 mmol/L — AB (ref 101–111)
CO2: 24 mmol/L (ref 22–32)
Calcium: 9.3 mg/dL (ref 8.9–10.3)
Creatinine, Ser: 1.07 mg/dL (ref 0.61–1.24)
Glucose, Bld: 139 mg/dL — ABNORMAL HIGH (ref 65–99)
Potassium: 4.2 mmol/L (ref 3.5–5.1)
SODIUM: 156 mmol/L — AB (ref 135–145)
Total Bilirubin: 0.8 mg/dL (ref 0.3–1.2)
Total Protein: 5.9 g/dL — ABNORMAL LOW (ref 6.5–8.1)

## 2015-04-29 LAB — CBC WITH DIFFERENTIAL/PLATELET
BASOS ABS: 0 10*3/uL (ref 0.0–0.1)
BASOS PCT: 0 %
Eosinophils Absolute: 0.3 10*3/uL (ref 0.0–0.7)
Eosinophils Relative: 3 %
HCT: 51 % (ref 39.0–52.0)
HEMOGLOBIN: 14.1 g/dL (ref 13.0–17.0)
Lymphocytes Relative: 12 %
Lymphs Abs: 1.2 10*3/uL (ref 0.7–4.0)
MCH: 26.1 pg (ref 26.0–34.0)
MCHC: 27.6 g/dL — ABNORMAL LOW (ref 30.0–36.0)
MCV: 94.3 fL (ref 78.0–100.0)
MONOS PCT: 4 %
Monocytes Absolute: 0.4 10*3/uL (ref 0.1–1.0)
NEUTROS ABS: 8.1 10*3/uL — AB (ref 1.7–7.7)
NEUTROS PCT: 81 %
Platelets: DECREASED 10*3/uL (ref 150–400)
RBC: 5.41 MIL/uL (ref 4.22–5.81)
RDW: 18.9 % — ABNORMAL HIGH (ref 11.5–15.5)
SMEAR REVIEW: DECREASED
WBC: 9.9 10*3/uL (ref 4.0–10.5)

## 2015-04-29 LAB — I-STAT CHEM 8, ED
BUN: 45 mg/dL — AB (ref 6–20)
CHLORIDE: 120 mmol/L — AB (ref 101–111)
CREATININE: 1.1 mg/dL (ref 0.61–1.24)
Calcium, Ion: 1.36 mmol/L — ABNORMAL HIGH (ref 1.13–1.30)
Glucose, Bld: 133 mg/dL — ABNORMAL HIGH (ref 65–99)
HEMATOCRIT: 50 % (ref 39.0–52.0)
Hemoglobin: 17 g/dL (ref 13.0–17.0)
POTASSIUM: 4.1 mmol/L (ref 3.5–5.1)
Sodium: 157 mmol/L — ABNORMAL HIGH (ref 135–145)
TCO2: 25 mmol/L (ref 0–100)

## 2015-04-29 LAB — I-STAT TROPONIN, ED: Troponin i, poc: 0.03 ng/mL (ref 0.00–0.08)

## 2015-04-29 LAB — BRAIN NATRIURETIC PEPTIDE: B NATRIURETIC PEPTIDE 5: 228 pg/mL — AB (ref 0.0–100.0)

## 2015-04-29 LAB — I-STAT CG4 LACTIC ACID, ED: Lactic Acid, Venous: 1.96 mmol/L (ref 0.5–2.0)

## 2015-04-29 MED ORDER — ADENOSINE 6 MG/2ML IV SOLN
6.0000 mg | Freq: Once | INTRAVENOUS | Status: AC
  Administered 2015-04-29: 6 mg via INTRAVENOUS

## 2015-04-29 MED ORDER — SODIUM CHLORIDE 0.9 % IV BOLUS (SEPSIS)
1000.0000 mL | Freq: Once | INTRAVENOUS | Status: AC
  Administered 2015-04-29: 1000 mL via INTRAVENOUS

## 2015-04-29 MED ORDER — METOPROLOL TARTRATE 25 MG PO TABS
25.0000 mg | ORAL_TABLET | Freq: Three times a day (TID) | ORAL | Status: DC
  Administered 2015-04-29 – 2015-05-01 (×6): 25 mg via ORAL
  Filled 2015-04-29 (×7): qty 1

## 2015-04-29 MED ORDER — ADENOSINE 6 MG/2ML IV SOLN
INTRAVENOUS | Status: AC
  Filled 2015-04-29: qty 8

## 2015-04-29 MED ORDER — ADENOSINE 6 MG/2ML IV SOLN
12.0000 mg | Freq: Once | INTRAVENOUS | Status: AC
  Administered 2015-04-29: 12 mg via INTRAVENOUS

## 2015-04-29 MED ORDER — ADENOSINE 6 MG/2ML IV SOLN
INTRAVENOUS | Status: AC
  Filled 2015-04-29: qty 4

## 2015-04-29 MED ORDER — SODIUM CHLORIDE 0.45 % IV SOLN
INTRAVENOUS | Status: DC
  Administered 2015-04-29: via INTRAVENOUS

## 2015-04-29 NOTE — ED Provider Notes (Signed)
CSN: [id number]     Arrival date & time 04/29/15  1833 History   First MD Initiated Contact with Patient 04/29/15 1858     Chief Complaint  Patient presents with  . Tachycardia  . Dehydration     (Consider location/radiation/quality/duration/timing/severity/associated sxs/prior Treatment) Patient is a 75 y.o. male presenting with weakness. The history is provided by the nursing home (The patient was having rapid heart rate in the nursing home he was sent over here for evaluation).  Weakness This is a new problem. The current episode started 1 to 2 hours ago. The problem occurs constantly. The problem has not changed since onset.Associated symptoms include chest pain. Pertinent negatives include no abdominal pain. Nothing aggravates the symptoms. Nothing relieves the symptoms.    Past Medical History  Diagnosis Date  . CAD (coronary artery disease)     Multivessel status post stent to LAD 1998 then CABG 2003  . Myocardial infarction (HCC)   . History of stroke 76  . Type 2 diabetes mellitus (HCC)   . Essential hypertension   . Dyslipidemia   . Migraine headache   . GERD (gastroesophageal reflux disease)   . Anxiety   . Cervical disc disease    Past Surgical History  Procedure Laterality Date  . Coronary artery bypass graft  2003    Dr. Cornelius Moras - LIMA to LAD, SVG to OM, SVG to RCA  . Carotid endarterectomy Left   . Back surgery    . Anterior cervical decomp/discectomy fusion     Family History  Problem Relation Age of Onset  . Heart attack Father   . Heart attack Mother   . Heart attack Brother    Social History  Substance Use Topics  . Smoking status: Current Every Day Smoker -- 0.50 packs/day    Types: Cigarettes  . Smokeless tobacco: None  . Alcohol Use: No    Review of Systems  Unable to perform ROS: Mental status change  Cardiovascular: Positive for chest pain.  Gastrointestinal: Negative for abdominal pain.  Neurological: Positive for weakness.       Allergies  Ibuprofen  Home Medications   Prior to Admission medications   Medication Sig Start Date End Date Taking? Authorizing Provider  albuterol (PROVENTIL HFA;VENTOLIN HFA) 108 (90 Base) MCG/ACT inhaler Inhale 2 puffs into the lungs every 6 (six) hours as needed for wheezing or shortness of breath.   Yes Historical Provider, MD  amLODipine (NORVASC) 10 MG tablet Take 10 mg by mouth daily.   Yes Historical Provider, MD  atorvastatin (LIPITOR) 20 MG tablet Take 20 mg by mouth daily.   Yes Historical Provider, MD  esomeprazole (NEXIUM) 40 MG capsule Take 40 mg by mouth daily before breakfast.   Yes Historical Provider, MD  feeding supplement, ENSURE ENLIVE, (ENSURE ENLIVE) LIQD Take 237 mLs by mouth 2 (two) times daily between meals. 04/28/15  Yes Estela Isaiah Blakes, MD  gabapentin (NEURONTIN) 300 MG capsule Take 300 mg by mouth 3 (three) times daily.   Yes Historical Provider, MD  glimepiride (AMARYL) 2 MG tablet Take 2 mg by mouth daily.    Yes Historical Provider, MD  metFORMIN (GLUCOPHAGE) 500 MG tablet Take 1,000 mg by mouth 2 (two) times daily with a meal.   Yes Historical Provider, MD  metoprolol tartrate (LOPRESSOR) 25 MG tablet Take 1 tablet (25 mg total) by mouth 3 (three) times daily. 04/26/15  Yes Henderson Cloud, MD  nitroGLYCERIN (NITROSTAT) 0.4 MG SL tablet Place 0.4 mg  under the tongue every 5 (five) minutes as needed.   Yes Historical Provider, MD  nystatin-triamcinolone (MYCOLOG II) cream Apply topically 4 (four) times daily. 04/26/15  Yes Henderson Cloud, MD  oxyCODONE-acetaminophen (PERCOCET) 7.5-325 MG tablet Take 1 tablet by mouth every 4 (four) hours as needed for moderate pain. 04/28/15  Yes Estela Isaiah Blakes, MD  OXYGEN Inhale 4-6 L into the lungs continuous.   Yes Historical Provider, MD  furosemide (LASIX) 40 MG tablet Take 1 tablet (40 mg total) by mouth daily. 04/27/15   Henderson Cloud, MD   BP 131/83 mmHg   Pulse 101  Resp 18  SpO2 98% Physical Exam  Constitutional: He appears well-developed.  HENT:  Head: Normocephalic.  Eyes: Conjunctivae and EOM are normal. No scleral icterus.  Neck: Neck supple. No thyromegaly present.  Cardiovascular: Regular rhythm.  Exam reveals no gallop and no friction rub.   No murmur heard. Rapid regular rhythm  Pulmonary/Chest: No stridor. He has no wheezes. He has no rales. He exhibits no tenderness.  Abdominal: He exhibits no distension. There is no tenderness. There is no rebound.  Musculoskeletal: Normal range of motion. He exhibits no edema.  Lymphadenopathy:    He has no cervical adenopathy.  Neurological: He is alert. He exhibits normal muscle tone. Coordination normal.  Skin: No rash noted. No erythema.  Psychiatric: He has a normal mood and affect. His behavior is normal.    ED Course  .Cardioversion Date/Time: 04/29/2015 10:36 PM Performed by: Bethann Berkshire Authorized by: Bethann Berkshire Comments: Patient was in rapid SVT blood pressure was 80-90 systolic. Patient was not given any sedation. The patient was in SVT about 180.  Synchronized cardioversion was used to convert patient to normal sinus rhythm   (including critical care time) Labs Review Labs Reviewed  CBC WITH DIFFERENTIAL/PLATELET - Abnormal; Notable for the following:    MCHC 27.6 (*)    RDW 18.9 (*)    Neutro Abs 8.1 (*)    All other components within normal limits  COMPREHENSIVE METABOLIC PANEL - Abnormal; Notable for the following:    Sodium 156 (*)    Chloride 124 (*)    Glucose, Bld 139 (*)    BUN 43 (*)    Total Protein 5.9 (*)    Albumin 2.5 (*)    All other components within normal limits  BRAIN NATRIURETIC PEPTIDE - Abnormal; Notable for the following:    B Natriuretic Peptide 228.0 (*)    All other components within normal limits  I-STAT CHEM 8, ED - Abnormal; Notable for the following:    Sodium 157 (*)    Chloride 120 (*)    BUN 45 (*)    Glucose, Bld 133  (*)    Calcium, Ion 1.36 (*)    All other components within normal limits  I-STAT TROPOININ, ED  I-STAT CG4 LACTIC ACID, ED  I-STAT CG4 LACTIC ACID, ED    Imaging Review Dg Chest Portable 1 View  04/29/2015  CLINICAL DATA:  Shortness of breath.  Tachycardia. EXAM: PORTABLE CHEST 1 VIEW COMPARISON:  Most recent radiograph 04/26/2015, chest CT 04/18/2015 FINDINGS: Patient is post median sternotomy. Cardiomegaly is stable. There is likely increased in the patchy opacity in the left mid lower lung zone concerning for worsening pulmonary edema, partially obscured by overlying monitoring device. Similar findings seen at the right lung base, unchanged. Minimal blunting of right costophrenic angle suggestive pleural effusion. No pneumothorax. IMPRESSION: Suspect worsening patchy opacity in the  left mid and lower lung zone and right lung base, concerning for increased pulmonary edema versus less likely pneumonia. Multiple overlying monitoring devices partially obscure evaluation. Electronically Signed   By: Rubye OaksMelanie  Ehinger M.D.   On: 04/29/2015 19:58   I have personally reviewed and evaluated these images and lab results as part of my medical decision-making.   EKG Interpretation   Date/Time:  Monday April 29 2015 19:04:34 EST Ventricular Rate:  98 PR Interval:  125 QRS Duration: 99 QT Interval:  305 QTC Calculation: 389 R Axis:   75 Text Interpretation:  Sinus tachycardia Atrial premature complexes Minimal  ST depression, anterolateral leads Baseline wander in lead(s) V4 Confirmed  by Jahara Dail  MD, Malak Duchesneau [phone] on 04/29/2015 10:32:57 PM     CRITICAL CARE Performed by: Aveya Beal L Total critical care time: 45 minutes Critical care time was exclusive of separately billable procedures and treating other patients. Critical care was necessary to treat or prevent imminent or life-threatening deterioration. Critical care was time spent personally by me on the following activities: development  of treatment plan with patient and/or surrogate as well as nursing, discussions with consultants, evaluation of patient's response to treatment, examination of patient, obtaining history from patient or surrogate, ordering and performing treatments and interventions, ordering and review of laboratory studies, ordering and review of radiographic studies, pulse oximetry and re-evaluation of patient's condition.  MDM   Final diagnoses:  SVT (supraventricular tachycardia) (HCC)    Patient was in SVT he was given adenosine twice. The second time he converted to sinus rhythm for 10 seconds. He then went back into the SVT at 180. Synchronized cardioversion was used to convert patient to normal sinus rhythm. Labs showed he had a sodium of 155 and was dehydrated. Patient was admitted to stepdown     Bethann BerkshireJoseph Arend Bahl, MD 04/29/15 2238

## 2015-04-29 NOTE — ED Notes (Signed)
Synchronized cardioverted at 100 J. Successful. Sinus rhythm with PVCs noted post conversion.

## 2015-04-29 NOTE — H&P (Signed)
PCP:   [name],ASHISH, MD   Chief Complaint:  Heart rate 180 at SNF  HPI: 75 yo male with multiple medical issues sent in [facility] for rapid heart rate over 180.  Pt cannot provide any history due to his poor baseline.  Has been in/out hospital several times in the last week and is suppose to have palliative involved at SNF, does not appear this has been arranged yet.  On arrival pt was in SVT, given several doses of adenosine by EDP then cardioverted to NSR.  Referred for admission for his SVT.  Review of Systems:  Unobtainable due to pt poor baseline status   Past Medical History: Past Medical History  Diagnosis Date  . CAD (coronary artery disease)     Multivessel status post stent to LAD 1998 then CABG 2003  . Myocardial infarction (HCC)   . History of stroke 541997  . Type 2 diabetes mellitus (HCC)   . Essential hypertension   . Dyslipidemia   . Migraine headache   . GERD (gastroesophageal reflux disease)   . Anxiety   . Cervical disc disease    Past Surgical History  Procedure Laterality Date  . Coronary artery bypass graft  2003    Dr. Cornelius Moraswen - LIMA to LAD, SVG to OM, SVG to RCA  . Carotid endarterectomy Left   . Back surgery    . Anterior cervical decomp/discectomy fusion      Medications: Prior to Admission medications   Medication Sig Start Date End Date Taking? Authorizing Provider  albuterol (PROVENTIL HFA;VENTOLIN HFA) 108 (90 Base) MCG/ACT inhaler Inhale 2 puffs into the lungs every 6 (six) hours as needed for wheezing or shortness of breath.   Yes Historical Provider, MD  amLODipine (NORVASC) 10 MG tablet Take 10 mg by mouth daily.   Yes Historical Provider, MD  atorvastatin (LIPITOR) 20 MG tablet Take 20 mg by mouth daily.   Yes Historical Provider, MD  esomeprazole (NEXIUM) 40 MG capsule Take 40 mg by mouth daily before breakfast.   Yes Historical Provider, MD  feeding supplement, ENSURE ENLIVE, (ENSURE ENLIVE) LIQD Take 237 mLs by mouth 2 (two) times  daily between meals. 04/28/15  Yes Estela Isaiah BlakesY Hernandez Acosta, MD  gabapentin (NEURONTIN) 300 MG capsule Take 300 mg by mouth 3 (three) times daily.   Yes Historical Provider, MD  glimepiride (AMARYL) 2 MG tablet Take 2 mg by mouth daily.    Yes Historical Provider, MD  metFORMIN (GLUCOPHAGE) 500 MG tablet Take 1,000 mg by mouth 2 (two) times daily with a meal.   Yes Historical Provider, MD  metoprolol tartrate (LOPRESSOR) 25 MG tablet Take 1 tablet (25 mg total) by mouth 3 (three) times daily. 04/26/15  Yes Henderson CloudEstela Y Hernandez Acosta, MD  nitroGLYCERIN (NITROSTAT) 0.4 MG SL tablet Place 0.4 mg under the tongue every 5 (five) minutes as needed.   Yes Historical Provider, MD  nystatin-triamcinolone (MYCOLOG II) cream Apply topically 4 (four) times daily. 04/26/15  Yes Henderson CloudEstela Y Hernandez Acosta, MD  oxyCODONE-acetaminophen (PERCOCET) 7.5-325 MG tablet Take 1 tablet by mouth every 4 (four) hours as needed for moderate pain. 04/28/15  Yes Estela Isaiah BlakesY Hernandez Acosta, MD  OXYGEN Inhale 4-6 L into the lungs continuous.   Yes Historical Provider, MD  furosemide (LASIX) 40 MG tablet Take 1 tablet (40 mg total) by mouth daily. 04/27/15   Henderson CloudEstela Y Hernandez Acosta, MD    Allergies:   Allergies  Allergen Reactions  . Ibuprofen Hives  Social History:  reports that he has been smoking Cigarettes.  He has been smoking about 0.50 packs per day. He does not have any smokeless tobacco history on file. He reports that he does not drink alcohol or use illicit drugs.  Family History: Family History  Problem Relation Age of Onset  . Heart attack Father   . Heart attack Mother   . Heart attack Brother     Physical Exam: Filed Vitals:   04/29/15 2230 04/29/15 2245 04/29/15 2300 04/29/15 2302  BP: 127/69  125/64   Pulse:      Temp:    98.3 F (36.8 C)  TempSrc:    Axillary  Resp: 19 19 16    SpO2:       General appearance: alert and no distress Head: Normocephalic, without obvious abnormality,  atraumatic Eyes: negative Nose: Nares normal. Septum midline. Mucosa normal. No drainage or sinus tenderness. Neck: no JVD and supple, symmetrical, trachea midline Lungs: clear to auscultation bilaterally Heart: regular rate and rhythm, S1, S2 normal, no murmur, click, rub or gallop Abdomen: soft, non-tender; bowel sounds normal; no masses,  no organomegaly Extremities: extremities normal, atraumatic, no cyanosis or edema Pulses: 2+ and symmetric Skin: Skin color, texture, turgor normal. No rashes or lesions Neurologic: Grossly normal    Labs on Admission:   Recent Labs  04/28/15 0548 04/29/15 1955 04/29/15 2009  NA 155* 156* 157*  K 4.0 4.2 4.1  CL 119* 124* 120*  CO2 27 24  --   GLUCOSE 130* 139* 133*  BUN 58* 43* 45*  CREATININE 1.11 1.07 1.10  CALCIUM 10.4* 9.3  --     Recent Labs  04/26/15 2355 04/29/15 1955  AST 14* 15  ALT 18 21  ALKPHOS 60 59  BILITOT 1.4* 0.8  PROT 6.7 5.9*  ALBUMIN 3.0* 2.5*    Recent Labs  04/26/15 2355 04/27/15 0522 04/29/15 1955 04/29/15 2009  WBC 13.6* 12.6* 9.9  --   NEUTROABS 12.1*  --  8.1*  --   HGB 15.2 13.5 14.1 17.0  HCT 53.5* 48.8 51.0 50.0  MCV 92.6 92.4 94.3  --   PLT 98* 104* PLATELET CLUMPS NOTED ON SMEAR, COUNT APPEARS DECREASED  --     Recent Labs  04/27/15 0522 04/27/15 1027 04/27/15 1656  TROPONINI 0.03 0.03 0.03   Radiological Exams on Admission:  Dg Chest Portable 1 View  04/29/2015  CLINICAL DATA:  Shortness of breath.  Tachycardia. EXAM: PORTABLE CHEST 1 VIEW COMPARISON:  Most recent radiograph 04/26/2015, chest CT 04/18/2015 FINDINGS: Patient is post median sternotomy. Cardiomegaly is stable. There is likely increased in the patchy opacity in the left mid lower lung zone concerning for worsening pulmonary edema, partially obscured by overlying monitoring device. Similar findings seen at the right lung base, unchanged. Minimal blunting of right costophrenic angle suggestive pleural effusion. No  pneumothorax. IMPRESSION: Suspect worsening patchy opacity in the left mid and lower lung zone and right lung base, concerning for increased pulmonary edema versus less likely pneumonia. Multiple overlying monitoring devices partially obscure evaluation. Electronically Signed   By: Rubye Oaks M.D.   On: 04/29/2015 19:58   Old chart reviewed Case discussed with dr zammit  Assessment/Plan  75 yo male with multiple medical problems with SVT now resolved to NSR  Principal Problem:   SVT (supraventricular tachycardia) (HCC)- obs overnight in stepdown unit.  Issue resolved at this time.  Probably not getting his meds at SNF, cont his bblocker.    Active Problems:  Diabetes (HCC)   Pure hypercholesterolemia   Essential hypertension   COPD (chronic obstructive pulmonary disease) (HCC)  Overall pt has long term poor prognosis.  Possibly some goals of care to be established with the SNF and family would be beneficial.  DNR paperwork is at his bedside and confirmed.  Tierra Thoma A 04/29/2015, 11:05 PM

## 2015-04-29 NOTE — ED Notes (Signed)
Pt here from [facility] for evaluation of heart rate of 180 and possible dehydration. Pt has IV infusing and a foley intact on arrival to ED

## 2015-04-30 ENCOUNTER — Other Ambulatory Visit: Payer: Self-pay | Admitting: *Deleted

## 2015-04-30 DIAGNOSIS — L8990 Pressure ulcer of unspecified site, unspecified stage: Secondary | ICD-10-CM | POA: Insufficient documentation

## 2015-04-30 DIAGNOSIS — Z7189 Other specified counseling: Secondary | ICD-10-CM | POA: Diagnosis not present

## 2015-04-30 DIAGNOSIS — I10 Essential (primary) hypertension: Secondary | ICD-10-CM | POA: Diagnosis not present

## 2015-04-30 DIAGNOSIS — J438 Other emphysema: Secondary | ICD-10-CM | POA: Diagnosis not present

## 2015-04-30 DIAGNOSIS — E870 Hyperosmolality and hypernatremia: Secondary | ICD-10-CM | POA: Diagnosis not present

## 2015-04-30 DIAGNOSIS — Z515 Encounter for palliative care: Secondary | ICD-10-CM | POA: Diagnosis not present

## 2015-04-30 DIAGNOSIS — I471 Supraventricular tachycardia: Secondary | ICD-10-CM | POA: Diagnosis not present

## 2015-04-30 LAB — CBC
HCT: 45.7 % (ref 39.0–52.0)
Hemoglobin: 12.3 g/dL — ABNORMAL LOW (ref 13.0–17.0)
MCH: 25.2 pg — ABNORMAL LOW (ref 26.0–34.0)
MCHC: 26.9 g/dL — ABNORMAL LOW (ref 30.0–36.0)
MCV: 93.6 fL (ref 78.0–100.0)
Platelets: 112 K/uL — ABNORMAL LOW (ref 150–400)
RBC: 4.88 MIL/uL (ref 4.22–5.81)
RDW: 18.8 % — ABNORMAL HIGH (ref 11.5–15.5)
WBC: 9.3 K/uL (ref 4.0–10.5)

## 2015-04-30 LAB — BASIC METABOLIC PANEL
ANION GAP: 8 (ref 5–15)
BUN: 39 mg/dL — ABNORMAL HIGH (ref 6–20)
CALCIUM: 9.3 mg/dL (ref 8.9–10.3)
CHLORIDE: 123 mmol/L — AB (ref 101–111)
CO2: 25 mmol/L (ref 22–32)
Creatinine, Ser: 0.94 mg/dL (ref 0.61–1.24)
GFR calc Af Amer: >60 mL/min (ref >60)
GFR calc non Af Amer: >60 mL/min (ref >60)
GLUCOSE: 83 mg/dL (ref 65–99)
Potassium: 3.9 mmol/L (ref 3.5–5.1)
Sodium: 156 mmol/L — ABNORMAL HIGH (ref 135–145)

## 2015-04-30 LAB — GLUCOSE, CAPILLARY
Glucose-Capillary: 110 mg/dL — ABNORMAL HIGH (ref 65–99)
Glucose-Capillary: 141 mg/dL — ABNORMAL HIGH (ref 65–99)

## 2015-04-30 MED ORDER — CLONAZEPAM 1 MG PO TABS: ORAL_TABLET | ORAL | Status: DC

## 2015-04-30 MED ORDER — ATORVASTATIN CALCIUM 20 MG PO TABS
20.0000 mg | ORAL_TABLET | Freq: Every day | ORAL | Status: DC
  Administered 2015-04-30: 20 mg via ORAL
  Filled 2015-04-30 (×2): qty 1

## 2015-04-30 MED ORDER — NYSTATIN-TRIAMCINOLONE 100000-0.1 UNIT/GM-% EX CREA
TOPICAL_CREAM | Freq: Four times a day (QID) | CUTANEOUS | Status: DC
  Administered 2015-04-30 (×4): via TOPICAL
  Administered 2015-05-01: 1 via TOPICAL
  Administered 2015-05-01 (×2): via TOPICAL
  Administered 2015-05-01: 1 via TOPICAL
  Administered 2015-05-02 (×2): via TOPICAL
  Filled 2015-04-30 (×3): qty 15

## 2015-04-30 MED ORDER — AMLODIPINE BESYLATE 5 MG PO TABS
10.0000 mg | ORAL_TABLET | Freq: Every day | ORAL | Status: DC
  Administered 2015-04-30: 10 mg via ORAL
  Filled 2015-04-30 (×2): qty 2

## 2015-04-30 MED ORDER — INSULIN ASPART 100 UNIT/ML ~~LOC~~ SOLN
0.0000 [IU] | Freq: Three times a day (TID) | SUBCUTANEOUS | Status: DC
  Administered 2015-04-30 – 2015-05-01 (×2): 2 [IU] via SUBCUTANEOUS
  Administered 2015-05-02: 3 [IU] via SUBCUTANEOUS

## 2015-04-30 MED ORDER — CLONAZEPAM 0.5 MG PO TABS
1.0000 mg | ORAL_TABLET | Freq: Two times a day (BID) | ORAL | Status: DC
  Administered 2015-04-30 – 2015-05-01 (×3): 1 mg via ORAL
  Filled 2015-04-30 (×4): qty 2

## 2015-04-30 MED ORDER — INSULIN ASPART 100 UNIT/ML ~~LOC~~ SOLN: 0.0000 [IU] | Freq: Every day | SUBCUTANEOUS | Status: DC

## 2015-04-30 MED ORDER — NITROGLYCERIN 0.4 MG SL SUBL: 0.4000 mg | SUBLINGUAL_TABLET | SUBLINGUAL | Status: DC | PRN

## 2015-04-30 MED ORDER — INSULIN ASPART 100 UNIT/ML ~~LOC~~ SOLN: 4.0000 [IU] | Freq: Three times a day (TID) | SUBCUTANEOUS | Status: DC

## 2015-04-30 MED ORDER — PANTOPRAZOLE SODIUM 40 MG PO TBEC
40.0000 mg | DELAYED_RELEASE_TABLET | Freq: Every day | ORAL | Status: DC
  Administered 2015-04-30: 40 mg via ORAL
  Filled 2015-04-30 (×2): qty 1

## 2015-04-30 MED ORDER — DEXTROSE 5 % IV SOLN
INTRAVENOUS | Status: DC
  Administered 2015-04-30 – 2015-05-01 (×4): via INTRAVENOUS

## 2015-04-30 NOTE — Telephone Encounter (Signed)
[facility] 

## 2015-04-30 NOTE — Consult Note (Signed)
Consultation Note Date: 04/30/2015   Patient Name: Miguel Mendoza  DOB: 1941-03-03  MRN: [id number]  Age / Sex: 75 y.o., male  PCP: [name], MD Referring Physician: Micael Hampshire Acost*  Reason for Consultation: Establishing goals of care and Psychosocial/spiritual support    Clinical Assessment/Narrative: Miguel Mendoza is resting crossways in the bed.  He is alert but unable communicate in any meaningful way. Meeting with wife "Miguel Mendoza", and her sister Miguel Mendoza.   We talk about Miguel Mendoza current health concerns, including his heart and lung problems.  We talk about POA and code status.  Miguel Mendoza states that he would NOT WANT A PEG tube, and has told her that he wouldn't want to live in a SNF.  We talk about going day to day with outcomes. I share the chronic illness trajectory. We talk about medication administration, IV fluids not for long term hydration, and we talk about the MOST form and concepts like do not treat the next infection. Let nature take it's course. Allow a natural death.    Contacts/Participants in Discussion:   Wife "Miguel Mendoza" and her sister Miguel Mendoza Primary Decision Maker:  Miguel Mendoza is unable to make decisions at this time, therefore Miguel Mendoza makes choices.  There are 2 grown sons who are not participating in care.   Relationship to Patient wife HCPOA: no    SUMMARY OF RECOMMENDATIONS  Code Status/Advance Care Planning: DNR    Code Status Orders        Start     Ordered   04/29/15 2333  Do not attempt resuscitation (DNR)   Continuous    Question Answer Comment  In the event of cardiac or respiratory ARREST Do not call a "code blue"   In the event of cardiac or respiratory ARREST Do not perform Intubation, CPR, defibrillation or ACLS   In the event of cardiac or respiratory ARREST Use medication by any route, position, wound care, and other measures to relive  pain and suffering. May use oxygen, suction and manual treatment of airway obstruction as needed for comfort.      04/29/15 2332    Advance Directive Documentation        Most Recent Value   Type of Advance Directive  Out of facility DNR (pink MOST or yellow form)   Pre-existing out of facility DNR order (yellow form or pink MOST form)     "MOST" Form in Place?        Other Directives:None  Palliative Prophylaxis:   Aspiration and Turn Reposition  Additional Recommendations (Limitations, Scope, Preferences):  Treat the treatable, but DNR, NO PEG tube.   Psycho-social/Spiritual:  Support System: Fair, remarried ex wife in Aug. 2 grown sons who do not participate in care.  Desire for further Chaplaincy support:Not discussed today.  Additional Recommendations: none today.   Prognosis: Unable to determine, based on outcomes.   Discharge Planning: Skilled Nursing Facility for rehab with Palliative care service follow-up, again based on outcomes.    Chief Complaint/ Primary Diagnoses: Present on Admission:  . SVT (supraventricular tachycardia) (HCC) . COPD (chronic obstructive pulmonary disease) (HCC) . Essential hypertension . Pure hypercholesterolemia . Dehydration with hypernatremia  I have reviewed the medical record, interviewed the patient and family, and examined the patient. The following aspects are pertinent.  Past Medical History  Diagnosis Date  . CAD (coronary artery disease)     Multivessel status post stent to LAD 1998 then CABG 2003  . Myocardial infarction (HCC)   .  History of stroke 39  . Type 2 diabetes mellitus (HCC)   . Essential hypertension   . Dyslipidemia   . Migraine headache   . GERD (gastroesophageal reflux disease)   . Anxiety   . Cervical disc disease    Social History   Social History  . Marital Status: Divorced    Spouse Name: N/A  . Number of Children: N/A  . Years of Education: N/A   Social History Main Topics  . Smoking  status: Current Every Day Smoker -- 0.50 packs/day    Types: Cigarettes  . Smokeless tobacco: None  . Alcohol Use: No  . Drug Use: No  . Sexual Activity: Not Currently   Other Topics Concern  . None   Social History Narrative   Family History  Problem Relation Age of Onset  . Heart attack Father   . Heart attack Mother   . Heart attack Brother    Scheduled Meds: . amLODipine  10 mg Oral Daily  . atorvastatin  20 mg Oral Daily  . clonazePAM  1 mg Oral BID  . insulin aspart  0-15 Units Subcutaneous TID WC  . insulin aspart  0-5 Units Subcutaneous QHS  . insulin aspart  4 Units Subcutaneous TID WC  . metoprolol tartrate  25 mg Oral TID  . nystatin-triamcinolone   Topical QID  . pantoprazole  40 mg Oral Daily   Continuous Infusions: . dextrose 75 mL/hr at 04/30/15 1100   PRN Meds:.nitroGLYCERIN Medications Prior to Admission:  Prior to Admission medications   Medication Sig Start Date End Date Taking? Authorizing Provider  albuterol (PROVENTIL HFA;VENTOLIN HFA) 108 (90 Base) MCG/ACT inhaler Inhale 2 puffs into the lungs every 6 (six) hours as needed for wheezing or shortness of breath.   Yes Historical Provider, MD  amLODipine (NORVASC) 10 MG tablet Take 10 mg by mouth daily.   Yes Historical Provider, MD  atorvastatin (LIPITOR) 20 MG tablet Take 20 mg by mouth daily.   Yes Historical Provider, MD  esomeprazole (NEXIUM) 40 MG capsule Take 40 mg by mouth daily before breakfast.   Yes Historical Provider, MD  feeding supplement, ENSURE ENLIVE, (ENSURE ENLIVE) LIQD Take 237 mLs by mouth 2 (two) times daily between meals. 04/28/15  Yes Estela Isaiah Blakes, MD  gabapentin (NEURONTIN) 300 MG capsule Take 300 mg by mouth 3 (three) times daily.   Yes Historical Provider, MD  glimepiride (AMARYL) 2 MG tablet Take 2 mg by mouth daily.    Yes Historical Provider, MD  metFORMIN (GLUCOPHAGE) 500 MG tablet Take 1,000 mg by mouth 2 (two) times daily with a meal.   Yes Historical Provider,  MD  metoprolol tartrate (LOPRESSOR) 25 MG tablet Take 1 tablet (25 mg total) by mouth 3 (three) times daily. 04/26/15  Yes Henderson Cloud, MD  nitroGLYCERIN (NITROSTAT) 0.4 MG SL tablet Place 0.4 mg under the tongue every 5 (five) minutes as needed.   Yes Historical Provider, MD  nystatin-triamcinolone (MYCOLOG II) cream Apply topically 4 (four) times daily. 04/26/15  Yes Henderson Cloud, MD  oxyCODONE-acetaminophen (PERCOCET) 7.5-325 MG tablet Take 1 tablet by mouth every 4 (four) hours as needed for moderate pain. 04/28/15  Yes Estela Isaiah Blakes, MD  OXYGEN Inhale 4-6 L into the lungs continuous.   Yes Historical Provider, MD  clonazePAM Scarlette Calico) 1 MG tablet Take one tablet by mouth twice daily 04/30/15   Sharon Seller, NP  furosemide (LASIX) 40 MG tablet Take 1 tablet (40  mg total) by mouth daily. 04/27/15   Henderson Cloud, MD   Allergies  Allergen Reactions  . Ibuprofen Hives    Review of Systems  Unable to perform ROS: Mental status change    Physical Exam  Nursing note and vitals reviewed. Constitutional: He appears well-developed and well-nourished.  HENT:  Head: Normocephalic and atraumatic.  Cardiovascular:  Runs of SVT, rate 90's mostly  Respiratory: Effort normal and breath sounds normal. No respiratory distress.  GI: Soft. Bowel sounds are normal. He exhibits no distension. There is no tenderness.  Neurological: He is alert.  Alert, confused  Skin: Skin is warm and dry.  Psychiatric:  Where are we?  "A barrel full of fun".     Vital Signs: BP 133/76 mmHg  Pulse 90  Temp(Src) 98.5 F (36.9 C) (Axillary)  Resp 24  Ht 5\' 10"  (1.778 m)  Wt 76.3 kg (168 lb 3.4 oz)  BMI 24.14 kg/m2  SpO2 92%  SpO2: SpO2: 92 % O2 Device:SpO2: 92 % O2 Flow Rate: .O2 Flow Rate (L/min): 2 L/min  IO: Intake/output summary:  Intake/Output Summary (Last 24 hours) at 04/30/15 1438 Last data filed at 04/30/15 1100  Gross per 24 hour  Intake   66.25 ml  Output   1550 ml  Net -1483.75 ml    LBM: Last BM Date: 04/30/15 Baseline Weight: Weight: 76.3 kg (168 lb 3.4 oz) Most recent weight: Weight: 76.3 kg (168 lb 3.4 oz)      Palliative Assessment/Data:  Flowsheet Rows        Most Recent Value   Intake Tab    Referral Department  Hospitalist   Unit at Time of Referral  ICU   Palliative Care Primary Diagnosis  Cardiac   Date Notified  04/30/15   Palliative Care Type  New Palliative care   Reason for referral  Advance Care Planning, Clarify Goals of Care   Date of Admission  04/29/15   Date first seen by Palliative Care  04/30/15   # of days Palliative referral response time  0 Day(s)   # of days IP prior to Palliative referral  1   Clinical Assessment    Palliative Performance Scale Score  30%   Pain Max last 24 hours  Not able to report   Pain Min Last 24 hours  Not able to report   Dyspnea Max Last 24 Hours  Not able to report   Dyspnea Min Last 24 hours  Not able to report   Psychosocial & Spiritual Assessment    Palliative Care Outcomes    Patient/Family meeting held?  No   Palliative Care follow-up planned  Yes, Facility      Additional Data Reviewed:  CBC:    Component Value Date/Time   WBC 9.3 04/30/2015 0433   HGB 12.3* 04/30/2015 0433   HCT 45.7 04/30/2015 0433   PLT 112* 04/30/2015 0433   MCV 93.6 04/30/2015 0433   NEUTROABS 8.1* 04/29/2015 1955   LYMPHSABS 1.2 04/29/2015 1955   MONOABS 0.4 04/29/2015 1955   EOSABS 0.3 04/29/2015 1955   BASOSABS 0.0 04/29/2015 1955   Comprehensive Metabolic Panel:    Component Value Date/Time   NA 156* 04/30/2015 0433   K 3.9 04/30/2015 0433   CL 123* 04/30/2015 0433   CO2 25 04/30/2015 0433   BUN 39* 04/30/2015 0433   CREATININE 0.94 04/30/2015 0433   GLUCOSE 83 04/30/2015 0433   CALCIUM 9.3 04/30/2015 0433   AST 15 04/29/2015 1955  ALT 21 04/29/2015 1955   ALKPHOS 59 04/29/2015 1955   BILITOT 0.8 04/29/2015 1955   PROT 5.9* 04/29/2015 1955    ALBUMIN 2.5* 04/29/2015 1955     Time In:  1440 Time Out: 1600 Time Total:  80 minutes  Greater than 50%  of this time was spent counseling and coordinating care related to the above assessment and plan. GOC discussion shared with nursing staff, CM, SW, and Dr. Ardyth HarpsHernandez on next rounds.   Signed by: Katheran Aweove,Bee Marchiano A, NP  Katheran Aweasha A Chavy Avera, NP  04/30/2015, 2:38 PM  Please contact Palliative Medicine Team phone at [phone] for questions and concerns.

## 2015-04-30 NOTE — Progress Notes (Addendum)
Patient ID: Miguel Mendoza, male   DOB: 08-27-1940, 75 y.o.   MRN: [id number]                HISTORY & PHYSICAL  DATE:  04/29/2015      FACILITY: [facility]                  LEVEL OF CARE:   SNF   CHIEF COMPLAINT:  [facility], [facility], 04/21/2015 through 04/26/2015.     HISTORY OF PRESENT ILLNESS:  This is a 75 year-old man who presented with dyspnea.  He initially was placed on BiPAP.  On 04/12/2015, he was intubated due to progression of respiratory failure.  He was extubated on 04/23/2015.    He had a right greater than left pleural effusion.   He had a right-sided thoracentesis showing 270 mL of what is quoted as a parapneumonic exudative effusion.  He has been on antibiotics for 12 days.    I think the cause of his respiratory failure was felt to be COPD with pneumonia.    He was also noted to have hypernatremia with a sodium of 152 on 04/25/2015.  This was 145 on 04/27/2015.  However, this was repeated, I believe, in our facility yesterday at 155.    Most recent white count  was 12.6, hemoglobin 13.5, platelet count at 104.    Urine specific gravity on 04/27/2015 was 1.025, not suggestive of a polyuria syndrome.    PAST MEDICAL HISTORY/PROBLEM LIST:   Reviewed.      Acute hypoxic and hypercarbic respiratory failure, felt to be secondary to COPD; right greater than left pleural effusion; CHF; and pneumonia.  Extubated on 04/23/2015.    Exudative pleural effusion, which was felt to be parapneumonic.    Community-acquired pneumonia.  He has completed antibiotics.    COPD with acute exacerbation.     Diabetes.  Started on a sliding scale due to ongoing steroids.    Acute on chronic diastolic heart failure.  A 2D-echo in October showed an EF of 55-60% with grade 1 diastolic dysfunction.    Hypernatremia.   Discharged from the hospital with a sodium of 145, although this was 155 yesterday.    History of coronary artery disease.  Not  felt to be unstable, yet a mildly elevated troponin at 0.05.    Acute renal failure.  Discharged with a creatinine of 1.19.    Tachycardia.  Unclear if this was AFib or SVT.  Cardiology increased his metoprolol to 25 b.i.d.    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    PAST SURGICAL HISTORY:    Past Surgical History  Procedure Laterality Date  . Coronary artery bypass graft  2003    Dr. Cornelius Moraswen - LIMA to LAD, SVG to OM, SVG to RCA  . Carotid endarterectomy Left   . Back surgery    . Anterior cervical decomp/discectomy fusion        CURRENT MEDICATIONS:  Medication list is reviewed.           Norvasc 10 q.d.      Lipitor 20 q.d.      Klonopin 1 mg b.i.d.      Plavix 75 q.d.      Nexium 40 q.d.     Lasix 40 alternating with 20 every other day.    Neurontin 300 q.d.    Amaryl 2 mg daily.    Metformin 1000 b.i.d.  Metoprolol 25 t.i.d.     Nitroglycerin p.r.n.    SOCIAL HISTORY:   At this point, I know very little about this man.             ADVANCED DIRECTIVES:  He does not come with any advanced directives.    FAMILY HISTORY:   Not currently available.      REVIEW OF SYSTEMS:   Not possible secondary to altered mental status/delirium.    PHYSICAL EXAMINATION:   VITAL SIGNS:     PULSE:  76.   RESPIRATIONS:  18.    BLOOD PRESSURE:  123/79.   02 SATURATIONS:  95% on 5 L.   GENERAL APPEARANCE:  The patient is lethargic, minimally arousable.   HEENT:   MOUTH/THROAT:  Dry mucous membranes.    NECK/THYROID:  No thyroid.  No masses.   CHEST/RESPIRATORY:  Poor air entry in the left lower lobe with coarse crackles.  Right lung has coarse crackles, but better air entry.  There is no wheezing.   CARDIOVASCULAR:   CARDIAC:  CABG scar noted.  Heart sounds are irregular, compatible with what sounds like AFib.  He is probably 5-10% dehydrated.     GASTROINTESTINAL:   ABDOMEN:  Distended.  No masses are noted.   LIVER/SPLEEN/KIDNEY:   No liver, no  spleen.   RECTUM:  He has a rectal tube/seal.  I am not really sure why this is necessary.  I do not see anything behind this.   GENITOURINARY:   BLADDER:  Foley catheter in place.  There is right CVA tenderness.   SKIN:   INSPECTION:  He has a fading candidal rash in his groin.  I did not see anything on his coccyx or heels.   NEUROLOGICAL:  Wasting of his lower extremity muscles.   DEEP TENDON REFLEXES:  I was able to attain a right knee jerk, not the left.  Both plantar responses are equivocal.   PSYCHIATRIC:   MENTAL STATUS:  The patient is arousable, but clearly delirious.    ASSESSMENT/PLAN:           Delirium.  This is probably secondary to hypernatremia.  This will need to be aggressively managed, initially with isotonic fluid and then hypotonic fluid after 2 L.    Hypernatremia.  See discussion above.     History of heart failure.  It is very difficult to be in heart failure with a sodium of 155, and this was yesterday.  I see no reason for Lasix at the moment.    AFib versus SVT.  His auscultation sounds like AFib.    COPD.  This does not appear to be unstable.    Right greater than left parapneumonic effusion.  Last x-ray was on 04/26/2015.  I reviewed this.  Chest x-ray showed patchy infiltrates, especially on the left.  I think this is probably pneumonia.    Diabetes.  On oral agents.  I will look at his blood sugars.    Acute renal failure.  His creatinine was 1.19 on discharge.  It was 1.11 yesterday.    The patient is delirious, hypernatremic.  I also wonder whether he has pyelonephritis on the right given his right CVA tenderness, although a culture on 04/27/2015 was negative.  I wonder if there is another reason for this.  I will follow this up on Wednesday. He will need IV fluids

## 2015-04-30 NOTE — Care Management Note (Signed)
Case Management Note  Patient Details  Name: Miguel Mendoza MRN: [id number] Date of Birth: Jun 27, 1940  Subjective/Objective:                  Pt admitted for SVT. [facility] Anticipate return to SNF at DC. CSW is aware. Palliative care also following pt.   Action/Plan: No CM needs.   Expected Discharge Date:    05/01/2015              Expected Discharge Plan:  Skilled Nursing Facility  In-House Referral:  NA  Discharge planning Services  CM Consult  Post Acute Care Choice:  NA Choice offered to:  NA  DME Arranged:    DME Agency:     HH Arranged:    HH Agency:     Status of Service:  Completed, signed off  Medicare Important Message Given:    Date Medicare IM Given:    Medicare IM give by:    Date Additional Medicare IM Given:    Additional Medicare Important Message give by:     If discussed at Long Length of Stay Meetings, dates discussed:    Additional Comments:  [name], Murlin Schrieber Demske, RN 04/30/2015, 3:34 PM

## 2015-04-30 NOTE — Progress Notes (Signed)
[facility] PROGRESS NOTE  Miguel Mendoza VZC:[id number] DOB: 1940/12/18 DOA: 04/29/2015 PCP: [name], MD  Assessment/Plan: Supraventricular tachycardia -This is the reason for his readmission. -On telemetry overnight he has not had SVT but instead has had multiple PVCs, ventricular bigeminy and several episodes of nonsustained ventricular tachycardia. -Continue metoprolol at current dose of 25 mg 3 times a day.  Recent acute hypoxemic and hypercarbic respiratory failure, ventilatory dependent -Was intubated for 12 days and extubated on 12/27. -This was believed to be multifactorial secondary to acute CHF, pneumonia, COPD and pleural effusions. -Since that hospitalization he has significantly declined. Very weak and required admission to SNF. -I believe his prognosis is poor, chances of rehabilitation are very low and he will continue to require hospital readmissions. I have broached the topic of hospice and palliative care with wife Annice Pih before. I believe this needs to be readdressed this hospitalization. Will ask for palliative care consultation.  Hypernatremia -Due to inability to maintain adequate free water intake. -Change fluids to D5 at 75 mL an hour, recheck sodium levels in a.m.    Code Status: DO NOT RESUSCITATE Family Communication: Wife Annice Pih via phone  Disposition Plan: To be determined, believe would greatly benefit from SNF. Transfer to telemetry.   Consultants:  None   Antibiotics:  None   Subjective: Lying in bed, is awake but not able to answer questions appropriately  Objective: Filed Vitals:   04/30/15 0300 04/30/15 0400 04/30/15 0500 04/30/15 0600  BP: 142/69 154/54 134/76 144/48  Pulse: 96 49 91   Temp:  98.7 F (37.1 C)    TempSrc:  Axillary    Resp: 18 18 18    Height:      Weight:   76.3 kg (168 lb 3.4 oz)   SpO2: 91% 94% 93% 98%    Intake/Output Summary (Last 24 hours) at 04/30/15 0950 Last data filed at 04/30/15  0917  Gross per 24 hour  Intake      0 ml  Output   1550 ml  Net  -1550 ml   Filed Weights   04/29/15 2335 04/30/15 0500  Weight: 76.3 kg (168 lb 3.4 oz) 76.3 kg (168 lb 3.4 oz)    Exam:   General:  Awake  Cardiovascular: Tachycardic, regular  Respiratory: Coarse bilateral breath sounds  Abdomen: Soft, nontender, nondistended  Extremities: No clubbing, cyanosis or edema   Neurologic:  Weak, nonfocal  Data Reviewed: Basic Metabolic Panel:  Recent Labs Lab 04/23/15 2210  04/26/15 2355 04/27/15 0522 04/28/15 0548 04/29/15 1955 04/29/15 2009 04/30/15 0433  NA 155*  < > 148* 145 155* 156* 157* 156*  K 3.7  < > 5.0 5.4* 4.0 4.2 4.1 3.9  CL 115*  < > 116* 117* 119* 124* 120* 123*  CO2 28  < > 26 24 27 24   --  25  GLUCOSE 218*  < > 135* 159* 130* 139* 133* 83  BUN 128*  < > 63* 67* 58* 43* 45* 39*  CREATININE 1.58*  < > 1.18 1.28* 1.11 1.07 1.10 0.94  CALCIUM 10.4*  < > 10.2 10.1 10.4* 9.3  --  9.3  MG 2.9*  --   --   --   --   --   --   --   < > = values in this interval not displayed. Liver Function Tests:  Recent Labs Lab 04/26/15 2355 04/29/15 1955  AST 14* 15  ALT 18 21  ALKPHOS 60 59  BILITOT 1.4* 0.8  PROT 6.7 5.9*  ALBUMIN 3.0* 2.5*   No results for input(s): LIPASE, AMYLASE in the last 168 hours. No results for input(s): AMMONIA in the last 168 hours. CBC:  Recent Labs Lab 04/25/15 0550 04/26/15 2355 04/27/15 0522 04/29/15 1955 04/29/15 2009 04/30/15 0433  WBC 12.9* 13.6* 12.6* 9.9  --  9.3  NEUTROABS  --  12.1*  --  8.1*  --   --   HGB 14.2 15.2 13.5 14.1 17.0 12.3*  HCT 50.6 53.5* 48.8 51.0 50.0 45.7  MCV 92.2 92.6 92.4 94.3  --  93.6  PLT 128* 98* 104* PLATELET CLUMPS NOTED ON SMEAR, COUNT APPEARS DECREASED  --  112*   Cardiac Enzymes:  Recent Labs Lab 04/26/15 2355 04/27/15 0522 04/27/15 1027 04/27/15 1656  TROPONINI 0.03 0.03 0.03 0.03   BNP (last 3 results)  Recent Labs  04/12/15 0449 04/26/15 2355 04/29/15 1955   BNP 520.0* 149.0* 228.0*    ProBNP (last 3 results) No results for input(s): PROBNP in the last 8760 hours.  CBG:  Recent Labs Lab 04/27/15 1931 04/28/15 0013 04/28/15 0518 04/28/15 0727 04/28/15 1122  GLUCAP 140* 110* 112* 99 138*    Recent Results (from the past 240 hour(s))  Urine culture     Status: None   Collection Time: 04/27/15  2:10 AM  Result Value Ref Range Status   Specimen Description URINE, CATHETERIZED  Final   Special Requests NONE  Final   Culture   Final    NO GROWTH 2 DAYS Performed at [facility]    Report Status 04/29/2015 FINAL  Final  MRSA PCR Screening     Status: None   Collection Time: 04/27/15  4:22 AM  Result Value Ref Range Status   MRSA by PCR NEGATIVE NEGATIVE Final    Comment:        The GeneXpert MRSA Assay (FDA approved for NASAL specimens only), is one component of a comprehensive MRSA colonization surveillance program. It is not intended to diagnose MRSA infection nor to guide or monitor treatment for MRSA infections.      Studies: Dg Chest Portable 1 View  04/29/2015  CLINICAL DATA:  Shortness of breath.  Tachycardia. EXAM: PORTABLE CHEST 1 VIEW COMPARISON:  Most recent radiograph 04/26/2015, chest CT 04/18/2015 FINDINGS: Patient is post median sternotomy. Cardiomegaly is stable. There is likely increased in the patchy opacity in the left mid lower lung zone concerning for worsening pulmonary edema, partially obscured by overlying monitoring device. Similar findings seen at the right lung base, unchanged. Minimal blunting of right costophrenic angle suggestive pleural effusion. No pneumothorax. IMPRESSION: Suspect worsening patchy opacity in the left mid and lower lung zone and right lung base, concerning for increased pulmonary edema versus less likely pneumonia. Multiple overlying monitoring devices partially obscure evaluation. Electronically Signed   By: Rubye Oaks M.D.   On: 04/29/2015 19:58    Scheduled  Meds: . metoprolol tartrate  25 mg Oral TID   Continuous Infusions: . sodium chloride 50 mL/hr at 04/29/15 2343    Principal Problem:   SVT (supraventricular tachycardia) (HCC) Active Problems:   Diabetes (HCC)   Pure hypercholesterolemia   Essential hypertension   COPD (chronic obstructive pulmonary disease) (HCC)   Dehydration with hypernatremia   Pressure ulcer    Time spent: 25 minutes. Greater than 50% of this time was spent in direct contact with the patient coordinating care.    Chaya Jan  [facility] Pager [phone]  If 7PM-7AM, please contact night-coverage  at [email or web address], password [email or web address] 04/30/2015, 9:50 AM

## 2015-05-01 DIAGNOSIS — E870 Hyperosmolality and hypernatremia: Secondary | ICD-10-CM | POA: Diagnosis not present

## 2015-05-01 DIAGNOSIS — I471 Supraventricular tachycardia: Secondary | ICD-10-CM | POA: Diagnosis not present

## 2015-05-01 DIAGNOSIS — Z7189 Other specified counseling: Secondary | ICD-10-CM | POA: Diagnosis not present

## 2015-05-01 DIAGNOSIS — Z515 Encounter for palliative care: Secondary | ICD-10-CM | POA: Diagnosis not present

## 2015-05-01 LAB — GLUCOSE, CAPILLARY
GLUCOSE-CAPILLARY: 117 mg/dL — AB (ref 65–99)
GLUCOSE-CAPILLARY: 114 mg/dL — AB (ref 65–99)
GLUCOSE-CAPILLARY: 133 mg/dL — AB (ref 65–99)
Glucose-Capillary: 83 mg/dL (ref 65–99)
Glucose-Capillary: 96 mg/dL (ref 65–99)

## 2015-05-01 MED ORDER — ENSURE ENLIVE PO LIQD
237.0000 mL | ORAL | Status: DC
  Administered 2015-05-01: 237 mL via ORAL

## 2015-05-01 MED ORDER — FUROSEMIDE 10 MG/ML IJ SOLN
40.0000 mg | Freq: Once | INTRAMUSCULAR | Status: AC
  Administered 2015-05-01: 40 mg via INTRAVENOUS
  Filled 2015-05-01: qty 4

## 2015-05-01 NOTE — Progress Notes (Signed)
[facility] PROGRESS NOTE  Miguel Mendoza ZOX:[id number] DOB: [birth year] DOA: 04/29/2015 PCP: [name], MD    75 year old male CAD S/B stent LAD in 1998, CABG 2003-underlying class 4 unstable angina with documented V. fib at time of LHC Chronic tobacco habituation Prior Morbid obesity, Body mass index is 23.66 kg/(m^2). 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 Hyperlipidemia Reflux Bipolar  12/15 recent admission dyspnea-found to have large right pleural effusion cardiology consulted placed on BiPAPand found to have a parapneumonic effusion treated for community-acquired pneumonia  echocardiogram10/2016 = [facility] EF 55-60%  04/26/15 readmitted-CODE STATUS DO NOT RESUSCITATE noted PCO2 elevation and  acute superimposed on chronic respiratory failure   once again admitted 04/29/15 SVT on admission found to have severe hypernatremia, BUN/creatinine 58/1.1, Hemoglobin 17      Assessment/Plan: Supraventricular tachycardia -no SVT but instead has had multiple PVCs, ventricular bigeminy and several episodes of nonsustained ventricular tachycardia. -Continue metoprolol at current dose of 25 mg 3 times a day.  Recent acute hypoxemic and hypercarbic respiratory failure, ventilatory dependent -Was intubated for 12 days and extubated on 12/27. -This was believed to be multifactorial secondary to acute CHF, pneumonia, COPD and pleural effusions. -Since that hospitalization he has significantly declined. Very weak and required admission to SNF. -I believe his prognosis is poor, chances of rehabilitation are very low and he will continue to require hospital readmissions.  -Await further delineation of goals by palliative care however would likely discharge back to health care facility in the next 24 hours and follow goals as an outpatient  Hypernatremia -Due to inability to maintain adequate free water intake. -Change fluids to D5 at 75 mL an hour -Given Lasix IV X1 to see if promoting  natriuresis although not likely to make a difference    Code Status: DO NOT RESUSCITATE Family Communication: Wife present in room talking to palliative care provider Disposition Plan:  Transfer to telemetry. Likely discharge to skilled or palliative care in 24-48 hours   Consultants:  None   Antibiotics:  None   Subjective:  Sleepy and incoherent  Not making much sense at the bedside and moving 4 limbs equally however   Objective: Filed Vitals:   05/01/15 0400 05/01/15 0500 05/01/15 0600 05/01/15 0700  BP: 142/78 140/70 145/82 130/68  Pulse: 84 82 87 77  Temp: 97.5 F (36.4 C)     TempSrc: Axillary     Resp: 20 22 24 15   Height:      Weight:  74.8 kg (164 lb 14.5 oz)    SpO2: 95% 97% 99% 99%    Intake/Output Summary (Last 24 hours) at 05/01/15 0729 Last data filed at 05/01/15 0500  Gross per 24 hour  Intake 191.25 ml  Output   2000 ml  Net -1808.75 ml   Filed Weights   04/29/15 2335 04/30/15 0500 05/01/15 0500  Weight: 76.3 kg (168 lb 3.4 oz) 76.3 kg (168 lb 3.4 oz) 74.8 kg (164 lb 14.5 oz)    Exam:   General:  Awake but not oriented   Cardiovascular: Tachycardic, regular  Respiratory: Coarse bilateral breath sounds  Abdomen: Soft, nontender, nondistended  Extremities: No clubbing, cyanosis or edema   Neurologic:  Weak, nonfocal  Data Reviewed: Basic Metabolic Panel:  Recent Labs Lab 04/26/15 2355 04/27/15 0522 04/28/15 0548 04/29/15 1955 04/29/15 2009 04/30/15 0433  NA 148* 145 155* 156* 157* 156*  K 5.0 5.4* 4.0 4.2 4.1 3.9  CL 116* 117* 119* 124* 120* 123*  CO2 26 24 27  24  --  25  GLUCOSE 135* 159* 130* 139* 133* 83  BUN 63* 67* 58* 43* 45* 39*  CREATININE 1.18 1.28* 1.11 1.07 1.10 0.94  CALCIUM 10.2 10.1 10.4* 9.3  --  9.3   Liver Function Tests:  Recent Labs Lab 04/26/15 2355 04/29/15 1955  AST 14* 15  ALT 18 21  ALKPHOS 60 59  BILITOT 1.4* 0.8  PROT 6.7 5.9*  ALBUMIN 3.0* 2.5*   No results for input(s): LIPASE,  AMYLASE in the last 168 hours. No results for input(s): AMMONIA in the last 168 hours. CBC:  Recent Labs Lab 04/25/15 0550 04/26/15 2355 04/27/15 0522 04/29/15 1955 04/29/15 2009 04/30/15 0433  WBC 12.9* 13.6* 12.6* 9.9  --  9.3  NEUTROABS  --  12.1*  --  8.1*  --   --   HGB 14.2 15.2 13.5 14.1 17.0 12.3*  HCT 50.6 53.5* 48.8 51.0 50.0 45.7  MCV 92.2 92.6 92.4 94.3  --  93.6  PLT 128* 98* 104* PLATELET CLUMPS NOTED ON SMEAR, COUNT APPEARS DECREASED  --  112*   Cardiac Enzymes:  Recent Labs Lab 04/26/15 2355 04/27/15 0522 04/27/15 1027 04/27/15 1656  TROPONINI 0.03 0.03 0.03 0.03   BNP (last 3 results)  Recent Labs  04/12/15 0449 04/26/15 2355 04/29/15 1955  BNP 520.0* 149.0* 228.0*    ProBNP (last 3 results) No results for input(s): PROBNP in the last 8760 hours.  CBG:  Recent Labs Lab 04/28/15 0518 04/28/15 0727 04/28/15 1122 04/30/15 1127 04/30/15 1648  GLUCAP 112* 99 138* 110* 141*    Recent Results (from the past 240 hour(s))  Urine culture     Status: None   Collection Time: 04/27/15  2:10 AM  Result Value Ref Range Status   Specimen Description URINE, CATHETERIZED  Final   Special Requests NONE  Final   Culture   Final    NO GROWTH 2 DAYS Performed at [facility]    Report Status 04/29/2015 FINAL  Final  MRSA PCR Screening     Status: None   Collection Time: 04/27/15  4:22 AM  Result Value Ref Range Status   MRSA by PCR NEGATIVE NEGATIVE Final    Comment:        The GeneXpert MRSA Assay (FDA approved for NASAL specimens only), is one component of a comprehensive MRSA colonization surveillance program. It is not intended to diagnose MRSA infection nor to guide or monitor treatment for MRSA infections.      Studies: Dg Chest Portable 1 View  04/29/2015  CLINICAL DATA:  Shortness of breath.  Tachycardia. EXAM: PORTABLE CHEST 1 VIEW COMPARISON:  Most recent radiograph 04/26/2015, chest CT 04/18/2015 FINDINGS: Patient is post  median sternotomy. Cardiomegaly is stable. There is likely increased in the patchy opacity in the left mid lower lung zone concerning for worsening pulmonary edema, partially obscured by overlying monitoring device. Similar findings seen at the right lung base, unchanged. Minimal blunting of right costophrenic angle suggestive pleural effusion. No pneumothorax. IMPRESSION: Suspect worsening patchy opacity in the left mid and lower lung zone and right lung base, concerning for increased pulmonary edema versus less likely pneumonia. Multiple overlying monitoring devices partially obscure evaluation. Electronically Signed   By: Rubye Oaks M.D.   On: 04/29/2015 19:58    Scheduled Meds: . amLODipine  10 mg Oral Daily  . atorvastatin  20 mg Oral Daily  . clonazePAM  1 mg Oral BID  . insulin aspart  0-15 Units Subcutaneous TID  WC  . insulin aspart  0-5 Units Subcutaneous QHS  . insulin aspart  4 Units Subcutaneous TID WC  . metoprolol tartrate  25 mg Oral TID  . nystatin-triamcinolone   Topical QID  . pantoprazole  40 mg Oral Daily   Continuous Infusions: . dextrose 75 mL/hr at 04/30/15 2145    Principal Problem:   SVT (supraventricular tachycardia) (HCC) Active Problems:   Diabetes (HCC)   Pure hypercholesterolemia   Essential hypertension   COPD (chronic obstructive pulmonary disease) (HCC)   Dehydration with hypernatremia   Pressure ulcer   Palliative care encounter   DNR (do not resuscitate) discussion    Time spent: 25 minutes. Greater than 50% of this time was spent in direct contact with the patient coordinating care.  Pleas KochJai Shantae Vantol, MD [facility] [phone]

## 2015-05-01 NOTE — Progress Notes (Signed)
Pt refuse to wear CPAP machine. Nurse informed to try to talk with pt about wearing and he begin to curse and still refuse to wear pt spo2 95% on ncan.

## 2015-05-01 NOTE — Progress Notes (Signed)
Daily Progress Note   Patient Name: Miguel Mendoza       Date: 05/01/2015 DOB: [birth year]  Age: 75 y.o. MRN#: [id number] Attending Physician: Rhetta Mura, MD Primary Care Physician: [name], MD Admit Date: 04/29/2015  Reason for Consultation/Follow-up: Establishing goals of care and Psychosocial/spiritual support  Subjective: Miguel Mendoza is unable to communicate effectually, although he looks improved from this morning.  His wife, Miguel Mendoza and SIL Miguel Mendoza are at bedside. We talk about his current health concerns, and that he has not shown improvements.  We talk about IV fluids and hydration, also NG tube as temporary measure for fluid and nutrition.  I share my concerns that he will not be able to improve.  Miguel Mendoza has low health literacy and needs reinforcement regarding options and outcomes.  She does tell me that she doesn't want [facility]osp[facility] Home, but prefers that he pass in the SNF if he is going to pass.  We talk about his ability to rehab (very poor), and the likelihood that he will continue to be readmitted with these same concerns.  I, again, share the chronic illness trajectory.  She has not come to terms with the fact that this is likely end of life for Miguel Mendoza, possibly dt his good state of health just 1 month ago.   Length of Stay: none  Current Medications: Scheduled Meds:  . amLODipine  10 mg Oral Daily  . atorvastatin  20 mg Oral Daily  . clonazePAM  1 mg Oral BID  . feeding supplement (ENSURE ENLIVE)  237 mL Oral Q24H  . insulin aspart  0-15 Units Subcutaneous TID WC  . insulin aspart  0-5 Units Subcutaneous QHS  . insulin aspart  4 Units Subcutaneous TID WC  . metoprolol tartrate  25 mg Oral TID  . nystatin-triamcinolone   Topical QID  . pantoprazole  40 mg Oral Daily      Continuous Infusions: . dextrose 75 mL/hr at 05/01/15 1104    PRN Meds: nitroGLYCERIN  Physical Exam: Physical Exam  Cardiovascular: Normal rate.   irreg with PVC's  Pulmonary/Chest: Effort normal. No respiratory distress.  Abdominal: Soft. There is no tenderness. There is no guarding.  Nursing note and vitals reviewed.               Vital Signs: BP 95/59 mmHg  Pulse 83  Temp(Src) 97 F (36.1 C) (Axillary)  Resp 14  Ht 5\' 10"  (1.778 m)  Wt 74.8 kg (164 lb 14.5 oz)  BMI 23.66 kg/m2  SpO2 96% SpO2: SpO2: 96 % O2 Device: O2 Device: Nasal Cannula O2 Flow Rate: O2 Flow Rate (L/min): 2 L/min  Intake/output summary:  Intake/Output Summary (Last 24 hours) at 05/01/15 1757 Last data filed at 05/01/15 1411  Gross per 24 hour  Intake 923.75 ml  Output   1400 ml  Net -476.25 ml   LBM: Last BM Date: 05/01/15 Baseline Weight: Weight: 76.3 kg (168 lb 3.4 oz) Most recent weight: Weight: 74.8 kg (164 lb 14.5 oz)       Palliative Assessment/Data: Flowsheet Rows        Most Recent Value   Intake Tab    Referral Department  Hospitalist   Unit at Time of Referral  ICU   Palliative Care Primary Diagnosis  Cardiac   Date Notified  04/30/15   Palliative Care Type  New Palliative care   Reason for referral  Advance Care Planning, Clarify Goals of Care   Date of Admission  04/29/15   Date first seen by Palliative Care  04/30/15   # of days Palliative referral response time  0 Day(s)   # of days IP prior to Palliative referral  1   Clinical Assessment    Palliative Performance Scale Score  30%   Pain Max last 24 hours  Not able to report   Pain Min Last 24 hours  Not able to report   Dyspnea Max Last 24 Hours  Not able to report   Dyspnea Min Last 24 hours  Not able to report   Psychosocial & Spiritual Assessment    Palliative Care Outcomes    Patient/Family meeting held?  No   Palliative Care follow-up planned  Yes, Facility      Additional Data Reviewed: CBC     Component Value Date/Time   WBC 9.3 04/30/2015 0433   RBC 4.88 04/30/2015 0433   HGB 12.3* 04/30/2015 0433   HCT 45.7 04/30/2015 0433   PLT 112* 04/30/2015 0433   MCV 93.6 04/30/2015 0433   MCH 25.2* 04/30/2015 0433   MCHC 26.9* 04/30/2015 0433   RDW 18.8* 04/30/2015 0433   LYMPHSABS 1.2 04/29/2015 1955   MONOABS 0.4 04/29/2015 1955   EOSABS 0.3 04/29/2015 1955   BASOSABS 0.0 04/29/2015 1955    CMP     Component Value Date/Time   NA 156* 04/30/2015 0433   K 3.9 04/30/2015 0433   CL 123* 04/30/2015 0433   CO2 25 04/30/2015 0433   GLUCOSE 83 04/30/2015 0433   BUN 39* 04/30/2015 0433   CREATININE 0.94 04/30/2015 0433   CALCIUM 9.3 04/30/2015 0433   PROT 5.9* 04/29/2015 1955   ALBUMIN 2.5* 04/29/2015 1955   AST 15 04/29/2015 1955   ALT 21 04/29/2015 1955   ALKPHOS 59 04/29/2015 1955   BILITOT 0.8 04/29/2015 1955   GFRNONAA >60 04/30/2015 0433   GFRAA >60 04/30/2015 0433       Problem List:  Patient Active Problem List   Diagnosis Date Noted  . Pressure ulcer 04/30/2015  . Palliative care encounter   . DNR (do not resuscitate) discussion   . SVT (supraventricular tachycardia) (HCC) 04/29/2015  . Dehydration with hypernatremia 04/29/2015  . Acute respiratory failure (HCC) 04/27/2015  . Acute on chronic respiratory failure with hypercapnia (HCC) 04/27/2015  . Encephalopathy acute 04/27/2015  . COPD (chronic  obstructive pulmonary disease) (HCC) 04/27/2015  . Status post thoracentesis   . Hyperosmolality and/or hypernatremia   . Uremia   . Tachycardia   . 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 Acute on chronic diastolic CHF (congestive heart failure), NYHA class 1 (HCC)   . Ventilator dependence (HCC) 04/12/2015  . Acute respiratory failure with hypercapnia (HCC) 04/12/2015  . COPD with acute exacerbation (HCC) 04/12/2015  . Diastolic CHF, acute on chronic (HCC) 04/12/2015  . CAP (community acquired pneumonia) 04/12/2015  .  Pleural effusion, right 04/11/2015  . Weight loss, abnormal 10/16/2011  . TOBACCO USER 06/03/2009  . AAA 06/03/2009  . Diabetes (HCC) 02/27/2009  . Pure hypercholesterolemia 02/27/2009  . MIGRAINE UNSP W/O INTRACT W/O STATUS MIGRAINOSUS 02/27/2009  . Essential hypertension 02/27/2009  . CORONARY ATHEROSCLEROSIS NATIVE CORONARY ARTERY 02/27/2009  . VENTRICULAR FIBRILLATION 02/27/2009  . SYNCOPE 02/27/2009  . CHEST PAIN UNSPECIFIED 02/27/2009  . 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 POSTSURGICAL AORTOCORONARY BYPASS STATUS 02/27/2009     Palliative Care Assessment & Plan    1.Code Status:  DNR    Code Status Orders        Start     Ordered   04/29/15 2333  Do not attempt resuscitation (DNR)   Continuous    Question Answer Comment  In the event of cardiac or respiratory ARREST Do not call a "code blue"   In the event of cardiac or respiratory ARREST Do not perform Intubation, CPR, defibrillation or ACLS   In the event of cardiac or respiratory ARREST Use medication by any route, position, wound care, and other measures to relive pain and suffering. May use oxygen, suction and manual treatment of airway obstruction as needed for comfort.      04/29/15 2332    Advance Directive Documentation        Most Recent Value   Type of Advance Directive  Out of facility DNR (pink MOST or yellow form)   Pre-existing out of facility DNR order (yellow form or pink MOST form)     "MOST" Form in Place?         2. Goals of Care/Additional Recommendations:  Return to [facility] for rehab at this time.   Limitations on Scope of Treatment: Treat the treatable.   Desire for further Chaplaincy support:no  Psycho-social Needs: None at this time.   4. Palliative Prophylaxis:   Turn Reposition  5. Prognosis: Unable to determine  6. Discharge Planning:  Skilled Nursing Facility for rehab with Palliative care service follow-up   Care plan was discussed with nursing  staff, CM, SW, and Dr. Mahala Menghini.   Thank you for allowing the Palliative Medicine Team to assist in the care of this patient.   Time In: 1630 Time Out: 1700 Total Time 30 minutes Prolonged Time Billed  no         Katheran Awe, NP  05/01/2015, 5:57 PM  Please contact Palliative Medicine Team phone at [phone] for questions and concerns.

## 2015-05-01 NOTE — Progress Notes (Addendum)
Initial Nutrition Assessment  DOCUMENTATION CODES:  Severe malnutrition in context of acute illness/injury  INTERVENTION:  Recommend Swallow Eval if pt is not to go comfort care.   During his 12/30 admit he was on Dys 2. Initially, this admit He was on Dys 1. Appears to be on a regular diet now, though he is edentulous  Ensure Enlive q24, each supplement provides 350 kcal and 20 grams of protein  Magic cup BID , each supplement provides 290 kcal and 9 grams of protein  NUTRITION DIAGNOSIS:  Malnutrition related to poor appetite and acute on chronic illness as evidenced by loss of 19% bw in <1 month and an estimated energy intake that met < or equal to 50% of needs for > or equal to 5 days.  GOAL:  Patient will meet greater than or equal to 90% of their needs  MONITOR:  PO intake, Supplement acceptance, Labs, Weight trends, I & O's, Diet order, goals of care  REASON FOR ASSESSMENT:  Malnutrition Screening Tool    ASSESSMENT:  75 y/o male PMHx CAD, MI, COPD, CHF, DM2, CVA, GERD, CRF, HTN, anxiety who has had 4x admissions in the past month, one of which required TF and intubation. This readmission is related to SVT. Palliative care is following.  Spoke with wife and her sister. Unfortunately, wife is very flustered/overwhelmed at the moment and was not able to provide very detailed history.   Per there report, pt does not eat or drink. He will not even take his medications. Pt was initially placed on Pureed Diet. Unsure of diet he was on at [facility]. Per pt's family  He holds food in his mouth and they report that a couple days ago pt held a piece of [place] in his mouth and pt's nurse had to remove it from his mouth. He has been on a diet order of Dyspahagia 1, 2, and Regular in the past week.Today nurse reports that he seems to be able to chew/swallow on own. If patient not to go CC, may benefit from a swallow eval to determine most appropriate diet.   From notes from past admission. Pts  Tube feeding had never gone above 20 cc/hr due to patients high rate of propofol.  It appears the last day he had TF was 12/27. Per family report he has had nothing since then except small bites. Per meal documentation, he has had 15% of a single meal, nothing else is recorded. He will have bites of applesauce with his medications.   RD tried to assess what pt would eat orally. Wife reports that he was drinking Ensure before he was intubated, however it sounds like she was thinking of the TF he was receiving. They report he does like ice cream and pt seemed to have a noticeable reaction on hearing ice cream. Will order  Of note, Per Palliative NP notes, pt has stated he does not want a PEG tube  Pts weight is now 162 lbs, he has lost 38 lbs since he his first admission this past month. Wife states that 200 was his UBW.  Pt had lost a significant amount of fluid during his first admission and it is impossible to determine how much wt loss attributed to malnourishment.   NFPE: Mild temporal muscle and  Mild orbital fat wasting.   Diet Order:  Diet Carb Modified Fluid consistency:: Thin; Room service appropriate?: Yes  Skin:  Dry, pale, MSAD sacrum, groin. Excoriated/abrasion buttocks. PU st 2 sacrum,   Last  BM:  1/4-incontinent-rectal tube  Height:  Ht Readings from Last 1 Encounters:  04/29/15 5' 10"  (1.778 m)  Height measurements have varied from 5'5" to 6'  Weight:  Wt Readings from Last 1 Encounters:  05/01/15 164 lb 14.5 oz (74.8 kg)   Wt Readings from Last 10 Encounters:  05/01/15 164 lb 14.5 oz (74.8 kg)  04/28/15 162 lb 0.6 oz (73.5 kg)  04/25/15 162 lb 4.1 oz (73.6 kg)  02/25/15 200 lb 6.4 oz (90.901 kg)  10/16/11 192 lb (87.091 kg)  01/21/10 201 lb (91.173 kg)  06/03/09 198 lb (89.812 kg)   Ideal Body Weight:  75.45 kg (Ht measurements have varied greatly)  BMI:  Body mass index is 23.66 kg/(m^2).  Estimated Nutritional Needs:  Kcal:  1700-1900 (23-26 kcal/kg  bw) Protein:  103-118 g (1.4-1.6 g/kg bw) Fluid:  per MD  EDUCATION NEEDS:  No education needs identified at this time  Burtis Junes RD, LDN Nutrition Pager: [phone] 05/01/2015 4:55 PM

## 2015-05-01 NOTE — Care Management Obs Status (Signed)
MEDICARE OBSERVATION STATUS NOTIFICATION   Patient Details  Name: Miguel Mendoza MRN: [id number] Date of Birth: 08-23-1940   Medicare Observation Status Notification Given:  Yes    [name], [name], RN 05/01/2015, 9:36 AM

## 2015-05-01 NOTE — Clinical Social Work Note (Signed)
Clinical Social Work Assessment  Patient Details  Name: York GriceMarvin C Kary MRN: [id number] Date of Birth: 14-May-1940  Date of referral:  05/01/15               Reason for consult:  Facility Placement                Permission sought to share information with:    Permission granted to share information::     Name::        Agency::     Relationship::     Contact Information:     Housing/Transportation Living arrangements for the past 2 months:  Single Family Home, Skilled Nursing Facility Source of Information:  Facility, Spouse Patient Interpreter Needed:  None Criminal Activity/Legal Involvement Pertinent to Current Situation/Hospitalization:  No - Comment as needed Significant Relationships:  Spouse Lives with:  Spouse, Facility Resident Do you feel safe going back to the place where you live?  Yes Need for family participation in patient care:  Yes (Comment)  Care giving concerns:  None identified.    Social Worker assessment / plan:  [facility] She stated that patient has been at the facility for very two days each time he was in the facility for less than 12 hours as he had to come back to the hospital.  She stated that PT had not had an opportunity to work with patient as of yet.  She advised that patient can return to the facility and won't need an FL2.  CSW spoke with patient's wife, Dionicia AblerGeraldine Pontius, she confirmed facility's statements.  She stated that she desired for patient to return to the facility at discharge.   Employment status:  Retired Database administratornsurance information:  Managed Medicare PT Recommendations:  Not assessed at this time Information / Referral to community resources:     Patient/Family's Response to care:  Patient's wife is agreeable for patient to return SNF.    Patient/Family's Understanding of and Emotional Response to Diagnosis, Current Treatment, and Prognosis: Patient's spouse verbalized being overwhelmed with patient's diagnosis, treatment  and prognosis.    Emotional Assessment Appearance:  Appears stated age Attitude/Demeanor/Rapport:  Unable to Assess Affect (typically observed):  Unable to Assess Orientation:  Oriented to Self Alcohol / Substance use:  Not Applicable Psych involvement (Current and /or in the community):  No (Comment)  Discharge Needs  Concerns to be addressed:  Discharge Planning Concerns Readmission within the last 30 days:  Yes Current discharge risk:  None Barriers to Discharge:  No Barriers Identified   Annice NeedySettle, Trayson Stitely D, LCSW 05/01/2015, 11:11 AM [phone]

## 2015-05-02 ENCOUNTER — Inpatient Hospital Stay
Admission: RE | Admit: 2015-05-02 | Discharge: 2015-06-20 | Disposition: A | Discharge status: Home / Self Care | Payer: Medicare HMO | Source: Ambulatory Visit | Attending: Internal Medicine | Admitting: Internal Medicine

## 2015-05-02 DIAGNOSIS — D6489 Other specified anemias: Secondary | ICD-10-CM | POA: Insufficient documentation

## 2015-05-02 DIAGNOSIS — E43 Unspecified severe protein-calorie malnutrition: Secondary | ICD-10-CM | POA: Insufficient documentation

## 2015-05-02 DIAGNOSIS — Z515 Encounter for palliative care: Secondary | ICD-10-CM | POA: Diagnosis not present

## 2015-05-02 DIAGNOSIS — E870 Hyperosmolality and hypernatremia: Secondary | ICD-10-CM | POA: Diagnosis not present

## 2015-05-02 DIAGNOSIS — I471 Supraventricular tachycardia: Secondary | ICD-10-CM | POA: Diagnosis not present

## 2015-05-02 LAB — CBC
HEMATOCRIT: 39.7 % (ref 39.0–52.0)
Hemoglobin: 11.7 g/dL — ABNORMAL LOW (ref 13.0–17.0)
MCH: 26.1 pg (ref 26.0–34.0)
MCHC: 29.5 g/dL — ABNORMAL LOW (ref 30.0–36.0)
MCV: 88.4 fL (ref 78.0–100.0)
Platelets: 103 10*3/uL — ABNORMAL LOW (ref 150–400)
RBC: 4.49 MIL/uL (ref 4.22–5.81)
RDW: 18.1 % — ABNORMAL HIGH (ref 11.5–15.5)
WBC: 6.8 10*3/uL (ref 4.0–10.5)

## 2015-05-02 LAB — BASIC METABOLIC PANEL
ANION GAP: 7 (ref 5–15)
BUN: 18 mg/dL (ref 6–20)
CHLORIDE: 105 mmol/L (ref 101–111)
CO2: 28 mmol/L (ref 22–32)
Calcium: 8.8 mg/dL — ABNORMAL LOW (ref 8.9–10.3)
Creatinine, Ser: 0.78 mg/dL (ref 0.61–1.24)
Glucose, Bld: 133 mg/dL — ABNORMAL HIGH (ref 65–99)
POTASSIUM: 3.4 mmol/L — AB (ref 3.5–5.1)
SODIUM: 140 mmol/L (ref 135–145)

## 2015-05-02 LAB — GLUCOSE, CAPILLARY
GLUCOSE-CAPILLARY: 163 mg/dL — AB (ref 65–99)
Glucose-Capillary: 119 mg/dL — ABNORMAL HIGH (ref 65–99)

## 2015-05-02 NOTE — Plan of Care (Addendum)
Problem: Fluid Volume: Goal: Ability to maintain a balanced intake and output will improve Outcome: Progressing Performed mouth care throughout the shift.  And attempted to offer fluids PO as well.

## 2015-05-02 NOTE — Progress Notes (Signed)
Daily Progress Note   Patient Name: Miguel Mendoza       Date: 05/02/2015 DOB: 1940-09-17  Age: 75 y.o. MRN#: [id number] Attending Physician: Rhetta Mura, MD Primary Care Physician: [name], MD Admit Date: 04/29/2015  Reason for Consultation/Follow-up: Disposition, Establishing goals of care and Psychosocial/spiritual support  Subjective: Miguel Mendoza is resting in bed, there is no family at bedside at this time.  He does not respond to me in any meaningful way today.  I see wife, Annice Pih, at the elevators and she tells me that she is going to the [facility] to sign Hospice paperwork.  I encourage her to call me as needed.  Plan of care discussed with PT and SW at [facility] regarding disposition for Miguel Mendoza.    Length of Stay: none  Current Medications: Scheduled Meds:  . amLODipine  10 mg Oral Daily  . atorvastatin  20 mg Oral Daily  . clonazePAM  1 mg Oral BID  . feeding supplement (ENSURE ENLIVE)  237 mL Oral Q24H  . insulin aspart  0-15 Units Subcutaneous TID WC  . insulin aspart  0-5 Units Subcutaneous QHS  . insulin aspart  4 Units Subcutaneous TID WC  . metoprolol tartrate  25 mg Oral TID  . nystatin-triamcinolone   Topical QID  . pantoprazole  40 mg Oral Daily    Continuous Infusions: . dextrose 75 mL/hr at 05/02/15 0900    PRN Meds: nitroGLYCERIN  Physical Exam: Physical Exam  Nursing note and vitals reviewed.               Vital Signs: BP 126/67 mmHg  Pulse 109  Temp(Src) 99.1 F (37.3 C) (Axillary)  Resp 20  Ht 5\' 10"  (1.778 m)  Wt 74.6 kg (164 lb 7.4 oz)  BMI 23.60 kg/m2  SpO2 97% SpO2: SpO2: 97 % O2 Device: O2 Device: Nasal Cannula O2 Flow Rate: O2 Flow Rate (L/min): 3 L/min  Intake/output summary:  Intake/Output Summary (Last 24 hours) at 05/02/15  1405 Last data filed at 05/02/15 1050  Gross per 24 hour  Intake   2165 ml  Output   3300 ml  Net  -1135 ml   LBM: Last BM Date: 05/02/15 Baseline Weight: Weight: 76.3 kg (168 lb 3.4 oz) Most recent weight: Weight: 74.6 kg (164 lb 7.4 oz)  Palliative Assessment/Data: Flowsheet Rows        Most Recent Value   Intake Tab    Referral Department  Hospitalist   Unit at Time of Referral  ICU   Palliative Care Primary Diagnosis  Cardiac   Date Notified  04/30/15   Palliative Care Type  New Palliative care   Reason for referral  Advance Care Planning, Clarify Goals of Care   Date of Admission  04/29/15   Date first seen by Palliative Care  04/30/15   # of days Palliative referral response time  0 Day(s)   # of days IP prior to Palliative referral  1   Clinical Assessment    Palliative Performance Scale Score  30%   Pain Max last 24 hours  Not able to report   Pain Min Last 24 hours  Not able to report   Dyspnea Max Last 24 Hours  Not able to report   Dyspnea Min Last 24 hours  Not able to report   Psychosocial & Spiritual Assessment    Palliative Care Outcomes    Patient/Family meeting held?  No   Palliative Care follow-up planned  Yes, Facility      Additional Data Reviewed: CBC    Component Value Date/Time   WBC 6.8 05/02/2015 0554   RBC 4.49 05/02/2015 0554   HGB 11.7* 05/02/2015 0554   HCT 39.7 05/02/2015 0554   PLT 103* 05/02/2015 0554   MCV 88.4 05/02/2015 0554   MCH 26.1 05/02/2015 0554   MCHC 29.5* 05/02/2015 0554   RDW 18.1* 05/02/2015 0554   LYMPHSABS 1.2 04/29/2015 1955   MONOABS 0.4 04/29/2015 1955   EOSABS 0.3 04/29/2015 1955   BASOSABS 0.0 04/29/2015 1955    CMP     Component Value Date/Time   NA 140 05/02/2015 0554   K 3.4* 05/02/2015 0554   CL 105 05/02/2015 0554   CO2 28 05/02/2015 0554   GLUCOSE 133* 05/02/2015 0554   BUN 18 05/02/2015 0554   CREATININE 0.78 05/02/2015 0554   CALCIUM 8.8* 05/02/2015 0554   PROT 5.9* 04/29/2015 1955     ALBUMIN 2.5* 04/29/2015 1955   AST 15 04/29/2015 1955   ALT 21 04/29/2015 1955   ALKPHOS 59 04/29/2015 1955   BILITOT 0.8 04/29/2015 1955   GFRNONAA >60 05/02/2015 0554   GFRAA >60 05/02/2015 0554       Problem List:  Patient Active Problem List   Diagnosis Date Noted  . Protein-calorie malnutrition, severe 05/02/2015  . Pressure ulcer 04/30/2015  . Palliative care encounter   . DNR (do not resuscitate) discussion   . SVT (supraventricular tachycardia) (HCC) 04/29/2015  . Dehydration with hypernatremia 04/29/2015  . Acute respiratory failure (HCC) 04/27/2015  . Acute on chronic respiratory failure with hypercapnia (HCC) 04/27/2015  . Encephalopathy acute 04/27/2015  . COPD (chronic obstructive pulmonary disease) (HCC) 04/27/2015  . Status post thoracentesis   . Hyperosmolality and/or hypernatremia   . Uremia   . Tachycardia   . 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 Acute on chronic diastolic CHF (congestive heart failure), NYHA class 1 (HCC)   . Ventilator dependence (HCC) 04/12/2015  . Acute respiratory failure with hypercapnia (HCC) 04/12/2015  . COPD with acute exacerbation (HCC) 04/12/2015  . Diastolic CHF, acute on chronic (HCC) 04/12/2015  . CAP (community acquired pneumonia) 04/12/2015  . Pleural effusion, right 04/11/2015  . Weight loss, abnormal 10/16/2011  . TOBACCO USER 06/03/2009  . AAA 06/03/2009  .  Diabetes (HCC) 02/27/2009  . Pure hypercholesterolemia 02/27/2009  . MIGRAINE UNSP W/O INTRACT W/O STATUS MIGRAINOSUS 02/27/2009  . Essential hypertension 02/27/2009  . CORONARY ATHEROSCLEROSIS NATIVE CORONARY ARTERY 02/27/2009  . VENTRICULAR FIBRILLATION 02/27/2009  . SYNCOPE 02/27/2009  . CHEST PAIN UNSPECIFIED 02/27/2009  . 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 POSTSURGICAL AORTOCORONARY BYPASS STATUS 02/27/2009     Palliative Care Assessment & Plan    1.Code Status:  DNR    Code Status  Orders        Start     Ordered   04/29/15 2333  Do not attempt resuscitation (DNR)   Continuous    Question Answer Comment  In the event of cardiac or respiratory ARREST Do not call a "code blue"   In the event of cardiac or respiratory ARREST Do not perform Intubation, CPR, defibrillation or ACLS   In the event of cardiac or respiratory ARREST Use medication by any route, position, wound care, and other measures to relive pain and suffering. May use oxygen, suction and manual treatment of airway obstruction as needed for comfort.      04/29/15 2332    Advance Directive Documentation        Most Recent Value   Type of Advance Directive  Out of facility DNR (pink MOST or yellow form)   Pre-existing out of facility DNR order (yellow form or pink MOST form)     "MOST" Form in Place?         2. Goals of Care/Additional Recommendations:  Return to SNF for rehab if possible.  Wife, Annice Pih, and I have discussed the concepts of allow a natural death, do not treat the next infection and do not rehospitalize.   Limitations on Scope of Treatment: Treat the treatable at this time.   Desire for further Chaplaincy support:Not discussed today.   Psycho-social Needs: None.   4. Palliative Prophylaxis:   Bowel Regimen, Oral Care and Turn Reposition  5. Prognosis: Unable to determine, likely less than 2 weeks d/t encephalopathy, dehydration, and ongoing risk for infections.   6. Discharge Planning:  Skilled Nursing Facility for rehab with Palliative care service follow-up   Care plan was discussed with nursing staff, PT, CM, SW, and Dr. Mahala Menghini.   Thank you for allowing the Palliative Medicine Team to assist in the care of this patient.   Time In: 0935 Time Out: 1000 Total Time 25 minutes Prolonged Time Billed  no         Katheran Awe, NP  05/02/2015, 2:05 PM  Please contact Palliative Medicine Team phone at [phone] for questions and concerns.

## 2015-05-02 NOTE — Discharge Summary (Signed)
Physician Discharge Summary  Miguel Mendoza NWG:[id number] DOB: 1940-08-18 DOA: 04/29/2015  PCP: [name],ASHISH, MD  Admit date: 04/29/2015 Discharge date: 05/02/2015  Time spent: 15 minutes  Recommendations for Outpatient Follow-up:  1. Recommend discharge to skilled facility with palliative care following 2. Simplified multiple medications 3. Wife has had discussions extensively with physician as well as palliative care on numerous occasions regarding irreversibility and likely downward trend of mentation and progressive symptoms leading to patient's ultimate demise. Although patient is encouraged to try to work with therapy services, if patient continues to decline, eats poor by mouth intake and is unable to perform ADLs IADLs, a discussion was carried out with the wife regarding DNR/DNI and do not hospitalize given this is the patient's third hospital admission within 1-2 months.    Discharge Diagnoses:  Principal Problem:   SVT (supraventricular tachycardia) (HCC) Active Problems:   Diabetes (HCC)   Pure hypercholesterolemia   Essential hypertension   COPD (chronic obstructive pulmonary disease) (HCC)   Dehydration with hypernatremia   Pressure ulcer   Palliative care encounter   DNR (do not resuscitate) discussion   Protein-calorie malnutrition, severe   Discharge Condition: Guarded  Diet recommendation: Dysphagia 1  Filed Weights   04/30/15 0500 05/01/15 0500 05/02/15 0500  Weight: 76.3 kg (168 lb 3.4 oz) 74.8 kg (164 lb 14.5 oz) 74.6 kg (164 lb 7.4 oz)    History of present illness:  75 year old male CAD S/B stent LAD in 1998, CABG 2003-underlying class 4 unstable angina with documented V. fib at time of LHC Chronic tobacco habituation Prior Morbid obesity, Body mass index is 23.66 kg/(m^2). 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 Hyperlipidemia Reflux Bipolar  12/15 recent admission dyspnea-found to have large right pleural effusion cardiology consulted placed on BiPAPand  found to have a parapneumonic effusion treated for community-acquired pneumonia  echocardiogram10/2016 = [facility] EF 55-60%  04/26/15 readmitted-CODE STATUS DO NOT RESUSCITATE noted PCO2 elevation and acute superimposed on chronic respiratory failure   once again admitted 04/29/15 SVT on admission found to have severe hypernatremia, BUN/creatinine 58/1.1, Hemoglobin 17  Hospital Course:  Supraventricular tachycardia -no SVT but instead has had multiple PVCs, ventricular bigeminy and several episodes of nonsustained ventricular tachycardia. -Continue metoprolol at current dose of 25 mg 3 times a day.  Recent acute hypoxemic and hypercarbic respiratory failure, ventilatory dependent -Was intubated for 12 days and extubated on 12/27. -This was believed to be multifactorial secondary to acute CHF, pneumonia, COPD and pleural effusions. -Since that hospitalization he has significantly declined. Very weak and required admission to SNF. -I believe his prognosis is poor, chances of rehabilitation are very low and he will continue to require hospital readmissions.  -Await further delineation of goals by palliative care however would likely discharge back to health care facility in the next 24 hours and follow goals as an outpatient  Hypernatremia -Due to inability to maintain adequate free water intake. -Surprisingly this resolved with a dose of IV Lasix 40 mg X1 and sodium on discharge was 140 -Will stop supportive fluid as well as natriuresis as would long-term not make a huge difference to his well-being and welfare      Code Status: DO NOT RESUSCITATE, do not hospitalize  Family Communication  long discussion with family at bedside Independently I spent 25 minutes with family on 05/01/15 trying to help them come to terms with end-of-life discussions and goals of care separated from palliative care services I confirmed that it is unlikely patient will make a robust recovery  nor a long-lasting  recovery but it is reasonable to send him to skilled facility with palliative care and hospice but If patient continues to decline which I suspect will happen We discussed feeding tubes, CODE STATUS, aggressive medical therapies and parenteral nutrition, all of which I do not think patient would benefit from long-term and patient would not have wanted Patient will be discharged to facility today  Disposition Plan: Transfer to telemetry. Likely discharge to skilled or palliative care in 24-48 hours    Discharge Exam: Filed Vitals:   05/02/15 0500 05/02/15 0600  BP: 120/71 122/77  Pulse: 94 100  Temp:    Resp: 23 37    General: sleepy but rousable.  Confused.  Still in mittens Cardiovascular: s1 s2 tachy-tele shows PVC Respiratory: clear no added sounds  Discharge Instructions   Discharge Instructions    Diet - low sodium heart healthy    Complete by:  As directed      Increase activity slowly    Complete by:  As directed           Current Discharge Medication List    CONTINUE these medications which have NOT CHANGED   Details  albuterol (PROVENTIL HFA;VENTOLIN HFA) 108 (90 Base) MCG/ACT inhaler Inhale 2 puffs into the lungs every 6 (six) hours as needed for wheezing or shortness of breath.    esomeprazole (NEXIUM) 40 MG capsule Take 40 mg by mouth daily before breakfast.    feeding supplement, ENSURE ENLIVE, (ENSURE ENLIVE) LIQD Take 237 mLs by mouth 2 (two) times daily between meals. Qty: 237 mL, Refills: 12    metFORMIN (GLUCOPHAGE) 500 MG tablet Take 1,000 mg by mouth 2 (two) times daily with a meal.    metoprolol tartrate (LOPRESSOR) 25 MG tablet Take 1 tablet (25 mg total) by mouth 3 (three) times daily.    nitroGLYCERIN (NITROSTAT) 0.4 MG SL tablet Place 0.4 mg under the tongue every 5 (five) minutes as needed.    nystatin-triamcinolone (MYCOLOG II) cream Apply topically 4 (four) times daily. Qty: 30 g, Refills: 0    oxyCODONE-acetaminophen (PERCOCET) 7.5-325  MG tablet Take 1 tablet by mouth every 4 (four) hours as needed for moderate pain. Qty: 30 tablet, Refills: 0    OXYGEN Inhale 4-6 L into the lungs continuous.    clonazePAM (KLONOPIN) 1 MG tablet Take one tablet by mouth twice daily Qty: 60 tablet, Refills: 0      STOP taking these medications     amLODipine (NORVASC) 10 MG tablet      atorvastatin (LIPITOR) 20 MG tablet      gabapentin (NEURONTIN) 300 MG capsule      glimepiride (AMARYL) 2 MG tablet      furosemide (LASIX) 40 MG tablet        Allergies  Allergen Reactions  . Ibuprofen Hives      The results of significant diagnostics from this hospitalization (including imaging, microbiology, ancillary and laboratory) are listed below for reference.    Significant Diagnostic Studies: Ct Head Wo Contrast  04/11/2015  CLINICAL DATA:  Altered mental status.  Evaluate for bleed. EXAM: CT HEAD WITHOUT CONTRAST TECHNIQUE: Contiguous axial images were obtained from the base of the skull through the vertex without intravenous contrast. COMPARISON:  02/07/2015 FINDINGS: Sinuses/Soft tissues: Mild motion degradation. Clear paranasal sinuses and mastoid air cells. Intracranial: Cerebral atrophy. 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 Mild low density in the periventricular white matter likely related to small vessel disease. Vertebral and carotid atherosclerosis.  No mass lesion, hemorrhage, hydrocephalus, acute infarct, intra-axial, or extra-axial fluid collection. IMPRESSION: 1.  No acute intracranial abnormality. 2. Motion degradation. 3.  Cerebral atrophy and small vessel ischemic change. 4. 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 Electronically Signed   By: Jeronimo Greaves M.D.   On: 04/11/2015 22:28   Ct Chest Wo Contrast  04/18/2015  CLINICAL DATA:  Bilateral pleural effusions. Hypoxic respiratory failure. Pneumonia. EXAM: CT CHEST WITHOUT CONTRAST TECHNIQUE: Multidetector CT imaging of the chest  was performed following the standard protocol without IV contrast. COMPARISON:  04/12/2015 FINDINGS: Endotracheal tube is in adequate position. Right-sided PICC line as tip within the SVC. Enteric tube with tip over the mid stomach. Lungs are adequately inflated and demonstrate interval improvement of a moderate size right pleural effusion with associated compressive atelectasis within the right base. Slight worsening consolidation of the posterior medial left base likely atelectasis with tiny amount of associated pleural fluid. Subtle hazy attenuation in the perihilar regions suggesting minimal interstitial edema. Tiny calcified granuloma over the lateral left upper lobe. Airways are within normal. There is mild stable cardiomegaly. There is calcified plaque over the coronary arteries and thoracic aorta. Median sternotomy wires are present. There has been a slight decrease in size of the precarinal lymph node which now measures 1 cm likely reactive. No evidence of hilar or axillary adenopathy. Remaining mediastinal structures are within normal. Images through the upper abdomen are unchanged. Mild degenerate change of the spine. IMPRESSION: Mild interval decrease in size of a moderate size right pleural effusion with associated compressive atelectasis in the right base. Slight worsening consolidation over the posterior medial left base likely atelectasis with tiny amount of pleural fluid. Cardiomegaly with findings suggesting subtle interstitial edema. Electronically Signed   By: Elberta Fortis M.D.   On: 04/18/2015 13:50   Ct Chest Wo Contrast  04/12/2015  CLINICAL DATA:  Respiratory failure. EXAM: CT CHEST WITHOUT CONTRAST TECHNIQUE: Multidetector CT imaging of the chest was performed following the standard protocol without IV contrast. COMPARISON:  02/07/2015 FINDINGS: Mediastinum: Previous median sternotomy and CABG procedure. Moderate cardiac enlargement. Aortic atherosclerosis noted. The trachea is patent  and midline. The ET tube tip is above the carina. OG tube appears scratch set there is a nasogastric tube within a normal appearing esophagus. Prominent mediastinal nodes are identified. Index right paratracheal lymph node measures 1.4 cm, image 23/ series 2. Unchanged from previous exam. No axillary or supraclavicular adenopathy. Lungs/Pleura: Moderate to large right pleural effusion identified. There is airspace consolidation involving the entire right lower lobe. This appears progressive when compared with previous exam. Small left pleural effusion with posterior medial consolidation is identified. Upper Abdomen: The adrenal glands are normal. Normal appearance of the liver and spleen. The visualized portions of the pancreas appear normal. Musculoskeletal: Spondylosis noted within the thoracic spine. No aggressive lytic or sclerotic bone lesions. IMPRESSION: 1. Interval increase in volume of right pleural effusion with complete consolidation of the right lower lobe. 2. New posterior medial consolidation of the left lower lobe. 3. Cardiac enlargement and aortic atherosclerosis. 4. Enlarged right paratracheal lymph node. Similar to previous exam. Electronically Signed   By: Signa Kell M.D.   On: 04/12/2015 16:31   [place] Chest  04/12/2015  CLINICAL DATA:  Pleural effusion. EXAM: CHEST ULTRASOUND COMPARISON:  Chest x-ray 04/11/2015. FINDINGS: Small right pleural effusion is noted. Patient could not tolerate positioning for evaluation. Given the small size of the effusion and difficulty in positioning patient, thoracentesis not performed. IMPRESSION: Small  pleural effusion.  Thoracentesis not performed. Electronically Signed   By: Maisie Fus  Register   On: 04/12/2015 10:29   Dg Chest Portable 1 View  04/29/2015  CLINICAL DATA:  Shortness of breath.  Tachycardia. EXAM: PORTABLE CHEST 1 VIEW COMPARISON:  Most recent radiograph 04/26/2015, chest CT 04/18/2015 FINDINGS: Patient is post median sternotomy. Cardiomegaly  is stable. There is likely increased in the patchy opacity in the left mid lower lung zone concerning for worsening pulmonary edema, partially obscured by overlying monitoring device. Similar findings seen at the right lung base, unchanged. Minimal blunting of right costophrenic angle suggestive pleural effusion. No pneumothorax. IMPRESSION: Suspect worsening patchy opacity in the left mid and lower lung zone and right lung base, concerning for increased pulmonary edema versus less likely pneumonia. Multiple overlying monitoring devices partially obscure evaluation. Electronically Signed   By: Rubye Oaks M.D.   On: 04/29/2015 19:58   Dg Chest Port 1 View  04/27/2015  CLINICAL DATA:  Acute onset of altered mental status. Initial encounter. EXAM: PORTABLE CHEST 1 VIEW COMPARISON:  Chest radiograph performed 04/25/2015 FINDINGS: The lungs are well-aerated. Vascular congestion is noted. Patchy bilateral central airspace opacities may reflect mild pulmonary edema or pneumonia. No pleural effusion or pneumothorax is seen. The cardiomediastinal silhouette is borderline normal in size. The patient is status post median sternotomy, with evidence of prior CABG. No acute osseous abnormalities are seen. IMPRESSION: Vascular congestion. Patchy bilateral central airspace opacities may reflect mild pulmonary edema or pneumonia. Electronically Signed   By: Roanna Raider M.D.   On: 04/27/2015 00:11   Dg Chest Port 1 View  04/25/2015  CLINICAL DATA:  PICC placement EXAM: PORTABLE CHEST 1 VIEW COMPARISON:  04/21/2015 chest radiograph. FINDINGS: Right PICC terminates in the upper third of the superior vena cava. Median sternotomy wires are aligned and intact. Partially visualized is surgical hardware overlying the lower cervical spine. Stable cardiomediastinal silhouette with mild cardiomegaly. No pneumothorax. Stable small left pleural effusion. No right pleural effusion. Mild pulmonary edema, slightly increased. Mild  patchy bibasilar lung opacities, favor atelectasis. IMPRESSION: 1. Mild congestive heart failure, slightly increased pulmonary edema. 2. Stable small left pleural effusion. 3. Stable patchy bibasilar lung opacities, favor atelectasis. 4. Right PICC terminates in the upper third of the superior vena cava. Electronically Signed   By: Delbert Phenix M.D.   On: 04/25/2015 12:24   Dg Chest Port 1 View  04/21/2015  CLINICAL DATA:  Respiratory failure EXAM: PORTABLE CHEST 1 VIEW COMPARISON:  04/19/2015 FINDINGS: The ET tube tip is above the carina. There is a a right arm PICC line with tip in the cavoatrial junction. Heart size is normal. There is a moderate left pleural effusion identified no right pleural effusion. Diminished aeration to the left lung base noted. IMPRESSION: 1. Decreased aeration to the left lung base with associated left pleural effusion. 2. Stable support apparatus. Electronically Signed   By: Signa Kell M.D.   On: 04/21/2015 09:04   Dg Chest Port 1 View  04/19/2015  CLINICAL DATA:  RIGHT pleural effusion post thoracentesis EXAM: PORTABLE CHEST 1 VIEW COMPARISON:  Portable exam 1412 hours compared to 0602 hours FINDINGS: Decrease in RIGHT pleural effusion and RIGHT basilar atelectasis versus earlier study. No definite pneumothorax. Stable endotracheal tube, nasogastric tube and RIGHT arm PICC line. Enlargement of cardiac silhouette post CABG. Atelectasis and effusion at LEFT base remain. Atherosclerotic calcification aorta. IMPRESSION: No pneumothorax following RIGHT thoracentesis. Electronically Signed   By: Ulyses Southward M.D.   On:  04/19/2015 14:37   Dg Chest Port 1 View  04/19/2015  CLINICAL DATA:  Ventilator.  Respiratory failure EXAM: PORTABLE CHEST 1 VIEW COMPARISON:  04/17/2015 FINDINGS: Prior CABG. There is cardiomegaly. Bilateral lower lobe opacities with layering effusions. Some improvement in aeration at the right lung base since prior study. Otherwise no change. IMPRESSION: Some  improvement in aeration at the right lung base. Otherwise no change. Electronically Signed   By: Charlett Nose M.D.   On: 04/19/2015 08:11   Dg Chest Port 1 View  04/17/2015  CLINICAL DATA:  Respiratory failure. EXAM: PORTABLE CHEST 1 VIEW COMPARISON:  04/16/2015 . FINDINGS: Endotracheal tube, NG tube, right PICC line in stable position. Prior CABG. Cardiomegaly with pulmonary venous congestion. Bilateral lower lobe infiltrates and or edema noted. Bilateral pleural effusions noted. Findings suggest congestive heart failure. Bibasilar pneumonia cannot be excluded. Low lung volumes with basilar atelectasis . No pneumothorax . Prior cervical spine fusion . IMPRESSION: 1. Lines and tubes in stable position. 2. Prior CABG. Cardiomegaly with pulmonary venous congestion. Bibasilar pulmonary infiltrates and or edema noted. Bilateral pleural effusions noted. Findings consistent with congestive heart failure. No significant change from prior exam. 3. Lung volumes with basilar atelectasis. Electronically Signed   By: Maisie Fus  Register   On: 04/17/2015 07:55   Dg Chest Port 1 View  04/16/2015  CLINICAL DATA:  Altered mental status, respiratory failure EXAM: PORTABLE CHEST 1 VIEW COMPARISON:  04/15/2015 FINDINGS: Endotracheal tube, NG tube and RIGHT PICC line are unchanged. There are low lung volumes and bilateral pleural effusions unchanged. Mild central venous congestion. No pneumothorax. IMPRESSION: 1. Stable support apparatus. 2. Low lung volumes with bilateral pleural effusions with no significant change. Electronically Signed   By: Genevive Bi M.D.   On: 04/16/2015 08:47   Dg Chest Port 1 View  04/15/2015  CLINICAL DATA:  Shortness of breath. EXAM: PORTABLE CHEST 1 VIEW COMPARISON:  04/14/2015.  CT 04/12/2015. FINDINGS: Endotracheal tube and right PICC line stable position PICC line in stable position. Prior CABG. Cardiomegaly with pulmonary vascular prominence and interstitial prominence suggesting mild  congestive heart failure. Persistent right mid and lower lung infiltrate. Small bilateral pleural effusions. No pneumothorax . IMPRESSION: 1. Endotracheal tube and right PICC line in stable position. 2. Prior CABG. Stable cardiomegaly. Mild pulmonary vascular prominence interstitial prominence noted suggesting mild congestive heart failure. Small bilateral pleural effusions noted. 3. Set right mid lung and lower lung infiltrate. Electronically Signed   By: Maisie Fus  Register   On: 04/15/2015 07:43   Dg Chest Port 1 View  04/14/2015  CLINICAL DATA:  Acute respiratory failure, disorientation. EXAM: PORTABLE CHEST 1 VIEW COMPARISON:  Chest x-ray and chest CT dated 04/12/2015, chest x-ray dated 04/11/2015 and chest x-ray dated 02/07/2015. FINDINGS: Endotracheal tube appears well positioned with tip just above the level of the carina. Enteric tube passes below the diaphragm. A right subclavian central line is better seen on previous chest CT, presumably obscured on this chest x-ray by the numerous lines overlying the right chest and mediastinum. Lung aeration is improved bilaterally. Suspect decreased right pleural effusion. Persistent dense opacities at each lung base compatible with the atelectasis identified on earlier chest CT. IMPRESSION: Improved aeration of both lungs indicating improved fluid status. Suspect at least some decrease in the size of the right pleural effusion. Probably stable atelectasis at each lung base. Tubes and lines grossly stable in position. Electronically Signed   By: Bary Richard M.D.   On: 04/14/2015 11:03   Dg Chest  Port 1 View  04/12/2015  CLINICAL DATA:  Endotracheal tube placement. EXAM: PORTABLE CHEST 1 VIEW COMPARISON:  April 11, 2015. FINDINGS: Stable cardiomegaly. Status post coronary artery bypass graft. Patient is rotated to the right. Mild central pulmonary vascular congestion is noted. Moderate right pleural effusion is again noted with probable underlying atelectasis  or infiltrate. No pneumothorax is noted. Endotracheal tube is seen projected over tracheal air shadow with distal tip at least 2 cm above the carina. Nasogastric tube is seen entering the stomach. IMPRESSION: Endotracheal tube appears to be in grossly good position. Stable cardiomegaly and central pulmonary vascular congestion is noted. Stable moderate right pleural effusion with probable underlying atelectasis or infiltrate. Electronically Signed   By: Lupita Raider, M.D.   On: 04/12/2015 11:04   Dg Chest Portable 1 View  04/11/2015  CLINICAL DATA:  Hypoxia.  Cough. EXAM: PORTABLE CHEST 1 VIEW COMPARISON:  February 07, 2015. FINDINGS: Stable cardiomegaly. Status post coronary artery bypass graft. No pneumothorax is noted. Mild central pulmonary vascular congestion is again noted and stable. Mild left pleural effusion is noted. Moderate right pleural effusion is noted which is increased compared to prior exam, with probable underlying atelectasis or infiltrate. Bony thorax is unremarkable. IMPRESSION: Stable mild left pleural effusion. Significantly increased moderate right pleural effusion with probable underlying atelectasis or infiltrate. Electronically Signed   By: Lupita Raider, M.D.   On: 04/11/2015 18:31   Dg Chest Port 1v Same Day  04/16/2015  CLINICAL DATA:  Check OG placement EXAM: PORTABLE CHEST - 1 VIEW SAME DAY COMPARISON:  04/16/2015 FINDINGS: Endotracheal tube is again identified in satisfactory position. A right-sided PICC line is noted at cavoatrial junction. A orogastric catheter is seen extending into the stomach. Bilateral pleural effusions and bibasilar atelectatic changes are again seen. No acute bony abnormality is noted. IMPRESSION: Tubes and lines as described in satisfactory position. Stable bibasilar changes. Electronically Signed   By: Alcide Clever M.D.   On: 04/16/2015 20:36   US Thoracentesis Asp Pleural Space W/img Guide  04/19/2015  CLINICAL DATA:  RIGHT pleural  effusion, RIGHT lower lobe atelectasis, high oxygen requirements, on ventilator EXAM: ULTRASOUND GUIDED DIAGNOSTIC AND THERAPEUTIC RIGHT THORACENTESIS COMPARISON:  Chest CT 04/17/2014 PROCEDURE: Procedure, benefits, and risks of procedure were discussed with patient's wife. Written informed consent for procedure was obtained. Time out protocol followed. Procedure performed portably in ICU. Patient placed in an LPO projection. Pleural effusion localized by ultrasound at the lateral RIGHT hemithorax. Skin prepped and draped in usual sterile fashion. Skin and soft tissues anesthetized with 10 mL of 1% lidocaine. Under direct sonographic visualization, 5 [place] Yueh catheter placed into the RIGHT pleural space. 270 mL of yellow RIGHT pleural fluid was aspirated by syringe. Due to limited positioning of patient for access of fluid, was unable to aspirate additional fluid. Procedure tolerated well by patient without immediate complication. COMPLICATIONS: None. FINDINGS: A total of approximately 270 mL of RIGHT pleural fluid was removed. A fluid sample of 180 mL was sent for laboratory analysis. IMPRESSION: Successful ultrasound guided RIGHT thoracentesis yielding 270 mL of pleural fluid. Electronically Signed   By: Ulyses Southward M.D.   On: 04/19/2015 14:42    Microbiology: Recent Results (from the past 240 hour(s))  Urine culture     Status: None   Collection Time: 04/27/15  2:10 AM  Result Value Ref Range Status   Specimen Description URINE, CATHETERIZED  Final   Special Requests NONE  Final   Culture  Final    NO GROWTH 2 DAYS Performed at [facility]    Report Status 04/29/2015 FINAL  Final  MRSA PCR Screening     Status: None   Collection Time: 04/27/15  4:22 AM  Result Value Ref Range Status   MRSA by PCR NEGATIVE NEGATIVE Final    Comment:        The GeneXpert MRSA Assay (FDA approved for NASAL specimens only), is one component of a comprehensive MRSA colonization surveillance program.  It is not intended to diagnose MRSA infection nor to guide or monitor treatment for MRSA infections.      Labs: Basic Metabolic Panel:  Recent Labs Lab 04/27/15 0522 04/28/15 0548 04/29/15 1955 04/29/15 2009 04/30/15 0433 05/02/15 0554  NA 145 155* 156* 157* 156* 140  K 5.4* 4.0 4.2 4.1 3.9 3.4*  CL 117* 119* 124* 120* 123* 105  CO2 24 27 24   --  25 28  GLUCOSE 159* 130* 139* 133* 83 133*  BUN 67* 58* 43* 45* 39* 18  CREATININE 1.28* 1.11 1.07 1.10 0.94 0.78  CALCIUM 10.1 10.4* 9.3  --  9.3 8.8*   Liver Function Tests:  Recent Labs Lab 04/26/15 2355 04/29/15 1955  AST 14* 15  ALT 18 21  ALKPHOS 60 59  BILITOT 1.4* 0.8  PROT 6.7 5.9*  ALBUMIN 3.0* 2.5*   No results for input(s): LIPASE, AMYLASE in the last 168 hours. No results for input(s): AMMONIA in the last 168 hours. CBC:  Recent Labs Lab 04/26/15 2355 04/27/15 0522 04/29/15 1955 04/29/15 2009 04/30/15 0433 05/02/15 0554  WBC 13.6* 12.6* 9.9  --  9.3 6.8  NEUTROABS 12.1*  --  8.1*  --   --   --   HGB 15.2 13.5 14.1 17.0 12.3* 11.7*  HCT 53.5* 48.8 51.0 50.0 45.7 39.7  MCV 92.6 92.4 94.3  --  93.6 88.4  PLT 98* 104* PLATELET CLUMPS NOTED ON SMEAR, COUNT APPEARS DECREASED  --  112* 103*   Cardiac Enzymes:  Recent Labs Lab 04/26/15 2355 04/27/15 0522 04/27/15 1027 04/27/15 1656  TROPONINI 0.03 0.03 0.03 0.03   BNP: BNP (last 3 results)  Recent Labs  04/12/15 0449 04/26/15 2355 04/29/15 1955  BNP 520.0* 149.0* 228.0*    ProBNP (last 3 results) No results for input(s): PROBNP in the last 8760 hours.  CBG:  Recent Labs Lab 04/30/15 2107 05/01/15 0757 05/01/15 1140 05/01/15 1629 05/01/15 2153  GLUCAP 83 117* 114* 133* 96       Signed:  Rhetta Mura MD   [facility] 05/02/2015, 7:29 AM

## 2015-05-02 NOTE — Evaluation (Addendum)
Physical Therapy Evaluation Patient Details Name: Miguel Mendoza MRN: [id number] DOB: 1940/07/06 Today's Date: 05/02/2015   History of Present Illness  74yo Male, fully indep in all ADL and modified independent mobility, [facility] found to harve HR in 180's. Pt being treated from SVT, noted to have some bigemony and multiple PVCs once medically controlled. Pt has had a complex medical pathway in the last 3 weeks, including multiple hospitalizations, 10d intubation (off on 12/28), and has presented with profound weakness. [facility], evaluated by OT, SLP,  but unable to see PT prior [facility].   Clinical Impression  Pt presenting with lethargy, profound weakness of BUE L greater than R, trunk weakness, and BLE weakness. Pt requires total assistance for bed mobility and transfers. Pt will benefit from skilled PT intervention to address the above impairments to improve independence in ADL and to restore pt to PLOF.     Follow Up Recommendations SNF;Supervision/Assistance - 24 hour    Equipment Recommendations  None recommended by PT    Recommendations for Other Services       Precautions / Restrictions Precautions Precautions: Fall Restrictions Weight Bearing Restrictions: No      Mobility  Bed Mobility               General bed mobility comments: unable to attempt mobilization due to lethargy, profound weakness. Difficulty maintaining trunk straight while semi recumbent.   Transfers                    Ambulation/Gait                Stairs            Wheelchair Mobility    Modified Rankin (Stroke Patients Only)       Balance Overall balance assessment: Needs assistance   Sitting balance-Leahy Scale: Zero                                       Pertinent Vitals/Pain Pain Assessment:  (Difficulty understanding patient. Says hes doing ok. )    Home Living Family/patient expects  to be discharged to:: Skilled nursing facility Living Arrangements: Spouse/significant other                    Prior Function Level of Independence: Needs assistance         Comments: 1 month ago, pt was indep in all ADL, and modified indep in mobility. When evaluated by OT and SLP most recently at SNF, Pt was total asssit for ADL, Max assist for transfers, and max assist for bed mobility.      Hand Dominance   Dominant Hand: Right    Extremity/Trunk Assessment   Upper Extremity Assessment: Generalized weakness (Very weak in BUE, Able to bring L hand to face upon command in supine; unable to do so on R (domniant side) without assistance. Grip strength is very weak on R and strong and slow on L. )           Lower Extremity Assessment:  (Pt is writhing slowly in bed, but does not follow commands for strength testing  in legs.  Legs appears mildly flaccid and profoundly weak bilat. )         Communication   Communication: Expressive difficulties  Cognition Arousal/Alertness: Lethargic Behavior  During Therapy: WFL for tasks assessed/performed (Pt is lethargic, with garbled and slurred speech, he responds to questions and simple commands about 50% of time. Family assists with comprehending his speech. ) Overall Cognitive Status: Impaired/Different from baseline                      General Comments      Exercises        Assessment/Plan    PT Assessment Patient needs continued PT services  PT Diagnosis Generalized weakness;Difficulty walking;Hemiplegia dominant side;Altered mental status   PT Problem List Decreased strength;Decreased activity tolerance;Decreased mobility;Cardiopulmonary status limiting activity;Decreased balance  PT Treatment Interventions Functional mobility training;Therapeutic exercise;Therapeutic activities;Balance training;Neuromuscular re-education;Patient/family education   PT Goals (Current goals can be found in the Care Plan  section) Acute Rehab PT Goals Patient Stated Goal: Return to PLOF indep in ADL, household ambulation PT Goal Formulation: With family Time For Goal Achievement: 05/16/15 Potential to Achieve Goals: Poor    Frequency Min 3X/week   Barriers to discharge        Co-evaluation               End of Session   Activity Tolerance: Patient limited by lethargy;Patient tolerated treatment well Patient left: in bed;with call bell/phone within reach;with bed alarm set;with family/visitor present Nurse Communication: Mobility status    Functional Limitation: Changing and maintaining body position Changing and Maintaining Body Position Current Status (Z6109): At least 80 percent but less than 100 percent impaired, limited or restricted Changing and Maintaining Body Position Goal Status (U0454): At least 80 percent but less than 100 percent impaired, limited or restricted Changing and Maintaining Body Position Discharge Status [phone]): At least 80 percent but less than 100 percent impaired, limited or restricted    Time: 1130-1141 PT Time Calculation (min) (ACUTE ONLY): 11 min   Charges:   PT Evaluation $PT Eval High Complexity: 1 Procedure     PT G Codes:   PT G-Codes **NOT FOR INPATIENT CLASS** Functional Limitation: Changing and maintaining body position Changing and Maintaining Body Position Current Status (B1478): At least 80 percent but less than 100 percent impaired, limited or restricted Changing and Maintaining Body Position Goal Status (G9562): At least 80 percent but less than 100 percent impaired, limited or restricted     Berdena Cisek C 05/02/2015, 12:24 PM  12:26 PM  Rosamaria Lints, PT, DPT [place] License # [id number]

## 2015-05-02 NOTE — Plan of Care (Signed)
Problem: Pain Managment: Goal: General experience of comfort will improve Outcome: Progressing Discussed pain and reposition patient throughout the shift and used rotation therapies

## 2015-05-02 NOTE — Progress Notes (Signed)
Orders for discharge received; Report [facility] to [facility]. Pt taken over in bed by RN and NT with dentures and other belongings. [facility] and [facility]. Family notified.

## 2015-05-02 NOTE — Clinical Social Work Note (Signed)
CSW notified patient's wife that patient was being discharged today.   [facility]  CSW sent clinicals via [employer].  CSW signing off.   Annice NeedySettle, Eydan Chianese D, [place] [phone]

## 2015-05-02 NOTE — Plan of Care (Signed)
Problem: Acute Rehab PT Goals(only PT should resolve) Goal: Pt Will Go Supine/Side To Sit Pt will demonstrate bed mobility rolling supine-to-side and supine to sitting edge-of-bed with maxA to decrease caregiver burden.     Goal: Patient Will Transfer Sit To/From Stand Pt will transfer sit to/from-stand with RW and MaxA without loss-of-balance to demonstrate good safety awareness and improved BLE strength.     Goal: PT Additional Goal #1 Patient will tolerate 5 minutes of sitting upright at EOB with min-modA and single UE support for improved trunk strength and postural control.

## 2015-05-03 ENCOUNTER — Non-Acute Institutional Stay (SKILLED_NURSING_FACILITY): Payer: Medicare HMO | Admitting: Internal Medicine

## 2015-05-03 DIAGNOSIS — J9622 Acute and chronic respiratory failure with hypercapnia: Secondary | ICD-10-CM

## 2015-05-03 DIAGNOSIS — I471 Supraventricular tachycardia: Secondary | ICD-10-CM

## 2015-05-03 DIAGNOSIS — E870 Hyperosmolality and hypernatremia: Secondary | ICD-10-CM

## 2015-05-03 DIAGNOSIS — Z789 Other specified health status: Secondary | ICD-10-CM

## 2015-05-03 DIAGNOSIS — I5033 Acute on chronic diastolic (congestive) heart failure: Secondary | ICD-10-CM

## 2015-05-03 NOTE — Progress Notes (Signed)
Patient ID: York GriceMarvin C Milius, male   DOB: 01-Apr-1941, 75 y.o.   MRN: [id number]                   DATE:  05/03/2015      FACILITY: [facility]                  LEVEL OF CARE:   SNF   CHIEF COMPLAINT:  Acute visit status post hospitalization for tachycardia-history of respiratory failure-hypernatremia    HISTORY OF PRESENT ILLNESS:  This is a 75 year-old man with quite a complex medical history-he recently was admitted  to the facility but readmitted to the hospital with severe hypernatremia. Marland Kitchen.  He also had what was diagnosis supraventricular tachycardia-this was controlled on metoprolol 25 mg 3 times a day.  His hypernatremia resolved with IV Lasix sodium on discharge was 140-  Previously patient was admitted to the hospital with dyspnea.  He initially was placed on BiPAP.  On 04/12/2015, he was intubated due to progression of respiratory failure.  He was extubated on 04/23/2015.    He had a right greater than left pleural effusion.   He had a right-sided thoracentesis showing 270 mL of what is quoted as a parapneumonic exudative effusion.  He has been on antibiotics for 12 days.     the cause of his respiratory failure was felt to be COPD with pneumonia.  After most recent hospitalization recommendation was for discharge to skilled nursing with palliative care following.  Apparently in the hospital physician did have extensive discussions with his wife-palliative care also spoke with her on numerous occasions regarding patient's likely downward trend of mentation and progressive symptoms leading to the patient's ultimate demise.  The discussion was carried out with his wife regarding DO NOT RESUSCITATE status and do not hospitalize secondary to patient's poor prognosis.          PAST MEDICAL HISTORY/PROBLEM LIST:   Reviewed.      Acute hypoxic and hypercarbic respiratory failure, felt to be secondary to COPD; right greater than left pleural effusion; CHF; and  pneumonia.  Extubated on 04/23/2015.    Exudative pleural effusion, which was felt to be parapneumonic.    Community-acquired pneumonia.  He has completed antibiotics.    COPD with acute exacerbation.     Diabetes.  Started on a sliding scale due to ongoing steroids.    Acute on chronic diastolic heart failure.  A 2D-echo in October showed an EF of 55-60% with grade 1 diastolic dysfunction.    Hypernatremia.   Discharged from the hospital with a sodium of 140   History of coronary artery disease.  Not felt to be unstable, yet a mildly elevated troponin at 0.05.    Acute renal failure.  Discharged with a creatinine of 0.78.    Tachycardia.  Unclear if this was AFib or SVT.  Cardiology increased his metoprolol to 25 t.i.d.    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    PAST SURGICAL HISTORY: Include coronary artery  grafting-back surgery -- anterior cervical discectomy fusion.      CURRENT MEDICATIONS:  Medication list is reviewed.             Albuterol inhaler 2 puffs every 6 hours when necessary.    Nexium 40 mg daily.    Feeding supplement ensure 3 times a day.    Glucophage thousand milligrams twice a day.    Lopressor 25 mg 3 times a day.  Nitroglycerin when necessary.    Percocet 7.5-325 mg one tablet every 4 hours when necessary.    Oxygen 4-6 L continuous.    Klonopin 1 mg twice a day.         Social history patient until recently apparently did smoke cigarettes about half a pack a day.  Apparently no smokeless tobacco history-alcohol or illicit drug history.  Family history significant for heart attack in his father and mother-as well as brother.        REVIEW OF SYSTEMS:   Not possible secondary patient has minimal verbalization.    PHYSICAL EXAMINATION:   Temperature 98.9 pulse 98 respirations 22 blood pressure 114/77 O2 saturation is 90% on 4 L oxygen-CBGs today 98 in the 107    GENERAL APPEARANCE:  The  patient is quite frail but alert and does speak although fairly minimally somewhat mumbling The skin is warm and dry he does appear to have some chronic bruising more noticeable upper extremities.   Eyes pupils appear reactive light visual acuity appears grossly intact.  Oropharynx is clear mucous membranes appear slightly dry.  Chest is clear to auscultation there is no labored breathing.  There is poor respiratory effort.  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  GU he does have a Foley catheter draining amber colored urine.  Musculoskeletal does move all extremities 4 grip strength appears to be intact-trength difficult to excess since patient is in bed but I do not note any significant deformities he is able to move all extremities.--He has diffuse muscle wasting  Neurologic appears to be grossly intact he moves all extremities he does have somewhat of a mumbling speech which I suspect is not new-  Psych again does not really speak much--he is alert and does make eye contact  Labs.  05/02/2015.  Sodium 140 potassium 3.4 BUN 18 creatinine 0.78 CO2 level XXVIII.  WBC 6.8 hemoglobin 11.7 platelets 103.  Generalized second 2017.  Liver function tests within normal limits except albumin of 2.5.  Assessment plan.  Supraventricular tachycardia-determine no SVT but instead had multiple PVCs ventricular bigeminy and several episodes of nonsustained ventricular tachycardia-plan was to continue metoprolol at current dose of 25 mg a times a day.  History of acute hypoxic and hypercarbic respiratory failure-was intubated for 12 days and neck stated on December 27-thought to be multifactorial secondary to acute CHF pneumonia COPD and pleural effusions.  Patient has had significant decline.  Prognosis is thought to be poor with chances of  rehabilitation low.  Expectation to await further definition of goals by palliative care.  Regards to hypernatremia--this resolved with a dose of IV Lasix sodium 1 discharge was 1 40 will recheck this tomorrow.   It appears hospitalist as well aspalliativecare had extensive discussions with patient's wife about goals of care.  It is thought patient's prognosis is quite poor  thought at some point he will need palliative care hospic--iff family is agreeable.  There was a discussion with family about feeding tubes CODE STATUS and aggressive medical therapies-it was thought patient did not benefit from aggressive medical intervention   History of CHF-patient's Lasix was discontinued in the hospital-at this point appears to be stable but he is a very fragile individual.  History of diabetes type 2 at this point appears stable on Glucophage recent blood sugars 98-and 107 this will need to be monitored.  History of COPD at this point appears stable but again a very fragile individual he is on albuterol inhaler as needed-also continues on chronic oxygen.  Again per hospital was a patient medications have been minimized his Norvasc Lipitor Neurontin Amaryl and Lasix have been discontinued-  Again at some point discussions will have -to be made with family about their goals of care for patient-he continues to be a very fragile individual with likely quite poor prognosis.    Marland Kitchen CPT 99310 of note greater than 40 minutes spent assessing patient-reviewing his chart-discussing his status with nursing staff and coordinating and formulating a plan of care for numerous diagnoses-of note greater than 50% of time spent coordinating plan of care ----

## 2015-05-04 ENCOUNTER — Encounter: Payer: Self-pay | Admitting: Internal Medicine

## 2015-05-04 ENCOUNTER — Other Ambulatory Visit (HOSPITAL_COMMUNITY)
Admission: RE | Admit: 2015-05-04 | Discharge: 2015-05-04 | Disposition: A | Discharge status: Home / Self Care | Payer: Medicare HMO | Source: Skilled Nursing Facility | Attending: Internal Medicine | Admitting: Internal Medicine

## 2015-05-04 DIAGNOSIS — I10 Essential (primary) hypertension: Secondary | ICD-10-CM | POA: Diagnosis present

## 2015-05-04 LAB — CBC WITH DIFFERENTIAL/PLATELET
BASOS ABS: 0 10*3/uL (ref 0.0–0.1)
Basophils Relative: 0 %
EOS PCT: 3 %
Eosinophils Absolute: 0.2 10*3/uL (ref 0.0–0.7)
HEMATOCRIT: 42.3 % (ref 39.0–52.0)
HEMOGLOBIN: 12.3 g/dL — AB (ref 13.0–17.0)
LYMPHS PCT: 20 %
Lymphs Abs: 1.1 10*3/uL (ref 0.7–4.0)
MCH: 25.9 pg — ABNORMAL LOW (ref 26.0–34.0)
MCHC: 29.1 g/dL — ABNORMAL LOW (ref 30.0–36.0)
MCV: 89.2 fL (ref 78.0–100.0)
Monocytes Absolute: 0.3 10*3/uL (ref 0.1–1.0)
Monocytes Relative: 5 %
NEUTROS ABS: 4 10*3/uL (ref 1.7–7.7)
NEUTROS PCT: 72 %
PLATELETS: 139 10*3/uL — AB (ref 150–400)
RBC: 4.74 MIL/uL (ref 4.22–5.81)
RDW: 17.9 % — ABNORMAL HIGH (ref 11.5–15.5)
WBC: 5.6 10*3/uL (ref 4.0–10.5)

## 2015-05-04 LAB — BASIC METABOLIC PANEL
ANION GAP: 9 (ref 5–15)
BUN: 17 mg/dL (ref 6–20)
CHLORIDE: 103 mmol/L (ref 101–111)
CO2: 32 mmol/L (ref 22–32)
Calcium: 9.1 mg/dL (ref 8.9–10.3)
Creatinine, Ser: 0.86 mg/dL (ref 0.61–1.24)
GFR calc Af Amer: >60 mL/min (ref >60)
Glucose, Bld: 147 mg/dL — ABNORMAL HIGH (ref 65–99)
POTASSIUM: 3.5 mmol/L (ref 3.5–5.1)
SODIUM: 144 mmol/L (ref 135–145)

## 2015-05-07 ENCOUNTER — Non-Acute Institutional Stay (SKILLED_NURSING_FACILITY): Payer: Medicare HMO | Admitting: Internal Medicine

## 2015-05-07 DIAGNOSIS — I472 Ventricular tachycardia: Secondary | ICD-10-CM

## 2015-05-07 DIAGNOSIS — E870 Hyperosmolality and hypernatremia: Secondary | ICD-10-CM | POA: Diagnosis not present

## 2015-05-07 DIAGNOSIS — R627 Adult failure to thrive: Secondary | ICD-10-CM

## 2015-05-07 DIAGNOSIS — I4729 Other ventricular tachycardia: Secondary | ICD-10-CM

## 2015-05-08 ENCOUNTER — Encounter (HOSPITAL_COMMUNITY)
Admission: AD | Admit: 2015-05-08 | Discharge: 2015-05-08 | Disposition: A | Discharge status: Home / Self Care | Payer: Medicare HMO | Source: Skilled Nursing Facility | Attending: Internal Medicine | Admitting: Internal Medicine

## 2015-05-08 ENCOUNTER — Non-Acute Institutional Stay (SKILLED_NURSING_FACILITY): Payer: Medicare HMO | Admitting: Internal Medicine

## 2015-05-08 DIAGNOSIS — I471 Supraventricular tachycardia: Secondary | ICD-10-CM

## 2015-05-08 DIAGNOSIS — I10 Essential (primary) hypertension: Secondary | ICD-10-CM | POA: Diagnosis present

## 2015-05-08 DIAGNOSIS — J9611 Chronic respiratory failure with hypoxia: Secondary | ICD-10-CM

## 2015-05-08 DIAGNOSIS — F05 Delirium due to known physiological condition: Secondary | ICD-10-CM

## 2015-05-08 DIAGNOSIS — E870 Hyperosmolality and hypernatremia: Secondary | ICD-10-CM | POA: Diagnosis not present

## 2015-05-08 DIAGNOSIS — J9612 Chronic respiratory failure with hypercapnia: Secondary | ICD-10-CM

## 2015-05-08 DIAGNOSIS — R410 Disorientation, unspecified: Secondary | ICD-10-CM

## 2015-05-08 LAB — BASIC METABOLIC PANEL
Anion gap: 7 (ref 5–15)
BUN: 18 mg/dL (ref 6–20)
CALCIUM: 8.9 mg/dL (ref 8.9–10.3)
CHLORIDE: 102 mmol/L (ref 101–111)
CO2: 35 mmol/L — ABNORMAL HIGH (ref 22–32)
Creatinine, Ser: 0.86 mg/dL (ref 0.61–1.24)
GLUCOSE: 93 mg/dL (ref 65–99)
Potassium: 3.8 mmol/L (ref 3.5–5.1)
SODIUM: 144 mmol/L (ref 135–145)

## 2015-05-10 ENCOUNTER — Non-Acute Institutional Stay (SKILLED_NURSING_FACILITY): Payer: Medicare HMO | Admitting: Internal Medicine

## 2015-05-10 DIAGNOSIS — E870 Hyperosmolality and hypernatremia: Secondary | ICD-10-CM | POA: Diagnosis not present

## 2015-05-10 DIAGNOSIS — R339 Retention of urine, unspecified: Secondary | ICD-10-CM | POA: Insufficient documentation

## 2015-05-10 DIAGNOSIS — R21 Rash and other nonspecific skin eruption: Secondary | ICD-10-CM

## 2015-05-10 DIAGNOSIS — R000 Tachycardia, unspecified: Secondary | ICD-10-CM

## 2015-05-10 NOTE — Progress Notes (Signed)
Patient ID: Miguel Mendoza, male   DOB: Apr 21, 1941, 75 y.o.   MRN: [id number]                    DATE:  05/10/2015      FACILITY: [facility]                  LEVEL OF CARE:   SNF   CHIEF COMPLAINT:  Acute visit follow-up urinary retention-buttocks rash     HISTORY OF PRESENT ILLNESS:  This is a 75 year-old man with quite a complex medical history-he recently was admitted  to the facility but readmitted to the hospital with severe hypernatremia. Marland Kitchen  He also had what was diagnosis supraventricular tachycardia-this was controlled on metoprolol 25 mg 3 times a day.  His hypernatremia resolved with IV Lasix sodium on discharge was 140-updated lab January 11 shows sodium of 144  Previously patient was admitted to the hospital with dyspnea.  He initially was placed on BiPAP.  On 04/12/2015, he was intubated due to progression of respiratory failure.  He was extubated on 04/23/2015.    He had a right greater than left pleural effusion.   He had a right-sided thoracentesis showing 270 mL of what is quoted as a parapneumonic exudative effusion.  He had been on antibiotics for 12 days.     the cause of his respiratory failure was felt to be COPD with pneumonia.  After most recent hospitalization recommendation was for discharge to skilled nursing with palliative care following--Dr. Leanord Hawking did see patient earlier this week and talked with his family-hospice consult is pending apparently this will be carried out early next week  This prognosis continues to be quite poor although apparently he is eating and drinking somewhat better.  Patient does have a rash to his buttocks area he is receiving topical cream but nursing feels he would benefit from a course of Diflucan as well.  He also appears to have urinary retention-Foley catheter was placed in according to nursing he had a significant amount of urine discharged and patient appeared to be much more comfortable less fidgety said  he felt better  His vital signs continued to be stable although again he is a very fragile individual.  .          PAST MEDICAL HISTORY/PROBLEM LIST:   Reviewed.      Acute hypoxic and hypercarbic respiratory failure, felt to be secondary to COPD; right greater than left pleural effusion; CHF; and pneumonia.  Extubated on 04/23/2015.    Exudative pleural effusion, which was felt to be parapneumonic.    Community-acquired pneumonia.  He has completed antibiotics.    COPD with acute exacerbation.     Diabetes.  Started on a sliding scale due to ongoing steroids.    Acute on chronic diastolic heart failure.  A 2D-echo in October showed an EF of 55-60% with grade 1 diastolic dysfunction.    Hypernatremia.   Discharged from the hospital with a sodium of 140   History of coronary artery disease.  Not felt to be unstable, yet a mildly elevated troponin at 0.05.    Acute renal failure.  Discharged with a creatinine of 0.78.    Tachycardia.  Unclear if this was AFib or SVT.  Cardiology increased his metoprolol to 25 t.i.d.    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    PAST SURGICAL HISTORY: Include coronary artery  grafting-back surgery -- anterior cervical discectomy fusion.  CURRENT MEDICATIONS:  Medication list is reviewed.             Albuterol inhaler 2 puffs every 6 hours when necessary.    Nexium 40 mg daily.    Feeding supplement ensure 3 times a day.    Glucophage thousand milligrams twice a day.    Lopressor 25 mg 3 times a day.    Nitroglycerin when necessary.    Percocet 7.5-325 mg one tablet every 4 hours when necessary.    Oxygen 4-6 L continuous.    Klonopin 1 mg twice a day.         Social history patient until recently apparently did smoke cigarettes about half a pack a day.  Apparently no smokeless tobacco history-alcohol or illicit drug history.  Family history significant for heart attack in his  father and mother-as well as brother.        REVIEW OF SYSTEMS:   Not possible secondary patient has minimal verbalization.    PHYSICAL EXAMINATION:    Temperature 97.3 axillary pulse 82 respirations 18 blood pressure 113/76 weight is 157.6 this appears relatively stable the past week    GENERAL APPEARANCE:  The patient is quite frail but alert and does speak although fairly minimally appears a bit more alert than previous exams The skin is warm and dry he does appear to have some chronic bruising more noticeable upper extremities. He has a confluent erythematous rash on his buttocks-   Eyes pupils appear reactive light visual acuity appears grossly intact.  Oropharynx is clear mucous membranes appear slightly dry.  Chest is clear to auscultation there is no labored breathing. But shallow air entry  There is poor respiratory effort.  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  GU he does have a Foley catheter draining amber colored urine--I do not note any significant suprapubic distention-or pain.  Musculoskeletal does move all extremities 4 grip strength appears to be intact-trength difficult to excess since patient is in bed but I do not note any significant deformities he is able to move all extremities.--He has diffuse muscle wasting  Neurologic appears to be grossly intact he moves all extremities   Psych again does not really speak much--he is alert and does speak although somewhat sparingly  Labs.  05/08/2015.  Sodium 144 potassium 3.8 BUN 18 creatinine 0.86.  05/04/2015.  WBC 5.6 hemoglobin 12.3 platelets 139  05/02/2015.  Sodium 140 potassium 3.4 BUN 18 creatinine 0.78 CO2 level XXVIII.  WBC 6.8 hemoglobin 11.7 platelets 103.  Generalized second 2017.  Liver function tests within normal limits except albumin of  2.5.  Assessment plan.  Buttocks rash-will add Diflucan 100 mg daily for 3 days and monitor he is followed by wound care as well.  Urinary retention-he did come in with a Foley catheter this was removed apparently shortly after admission-however he appears to be having urinary retention will have this reinserted secondary to urinary retention and comfort-per nursing he was much more comfortable after catheter was  Inserted this afternoon  Supraventricular tachycardia-determine no SVT but instead had multiple PVCs ventricular bigeminy and several episodes of nonsustained ventricular tachycardia-plan was to continue metoprolol at current dose of 25 mg a times a day.--Pulses controlled in the 80s today  History of acute hypoxic and hypercarbic respiratory failure-was intubated for 12 days and neck stated on December 27-thought to be multifactorial  secondary to acute CHF pneumonia COPD and pleural effusions.--At this point appears relatively stable although extremely fragile    Regards to hypernatremia--this resolved with a dose of IV Lasix  Sodium was 144 on lab done January 11 update lab has been ordered for next week fluids and by mouth intake will have to be encouraged apparently he is doing a bit better with this      History of CHF-patient's Lasix was discontinued in the hospital-at this point appears to be stable but he is a very fragile individual.    [id number]

## 2015-05-11 ENCOUNTER — Encounter: Payer: Self-pay | Admitting: Internal Medicine

## 2015-05-11 DIAGNOSIS — R627 Adult failure to thrive: Secondary | ICD-10-CM | POA: Insufficient documentation

## 2015-05-11 NOTE — Progress Notes (Signed)
Patient ID: Miguel Mendoza, male   DOB: 04-Apr-1941, 75 y.o.   MRN: [id number]                    DATE:  05/07/2015      FACILITY: [facility]                  LEVEL OF CARE:   SNF   CHIEF COMPLAINT: Acute visit follow-up labs with history of hypernatremia failure to thrive     HISTORY OF PRESENT ILLNESS:  This is a 75 year-old man with quite a complex medical history-he recently was admitted  to the facility but readmitted to the hospital with severe hypernatremia. Marland Kitchen  He also had what was diagnosis supraventricular tachycardia-this was controlled on metoprolol 25 mg 3 times a day.  His hypernatremia resolved with IV Lasix sodium on discharge was 140-  Previously patient was admitted to the hospital with dyspnea.  He initially was placed on BiPAP.  On 04/12/2015, he was intubated due to progression of respiratory failure.  He was extubated on 04/23/2015.    He had a right greater than left pleural effusion.   He had a right-sided thoracentesis showing 270 mL of what is quoted as a parapneumonic exudative effusion.  He has been on antibiotics for 12 days.     the cause of his respiratory failure was felt to be COPD with pneumonia.  After most recent hospitalization recommendation was for discharge to skilled nursing with palliative care following.  Apparently in the hospital physician did have extensive discussions with his wife-palliative care also spoke with her on numerous occasions regarding patient's likely downward trend of mentation and progressive symptoms leading to the patient's ultimate demise.  The discussion was carried out with his wife regarding DO NOT RESUSCITATE status and do not hospitalize secondary to patient's poor prognosis.  We have updated his labs as of January 7 these appear to be stable with sodium of 144 creatinine of 0.863  White count is 5.0 hemoglobin 12.3.  .  According to staff he is eating and drinking a bit better which is  encouraging elevated continues to be extremely frail and fragile.  His blood sugars appear to be stable largely in the higher 90s to mid 100s he is on Glucophage thousand milligrams twice a day.  Most recent weight is 156.5 this appears to be down about 5 pounds since admission weight I suspect there is some scale variability here            PAST MEDICAL HISTORY/PROBLEM LIST:   Reviewed.      Acute hypoxic and hypercarbic respiratory failure, felt to be secondary to COPD; right greater than left pleural effusion; CHF; and pneumonia.  Extubated on 04/23/2015.    Exudative pleural effusion, which was felt to be parapneumonic.    Community-acquired pneumonia.  He has completed antibiotics.    COPD with acute exacerbation.     Diabetes.  Started on a sliding scale due to ongoing steroids.    Acute on chronic diastolic heart failure.  A 2D-echo in October showed an EF of 55-60% with grade 1 diastolic dysfunction.    Hypernatremia.   Discharged from the hospital with a sodium of 140   History of coronary artery disease.  Not felt to be unstable, yet a mildly elevated troponin at 0.05.    Acute renal failure.  Discharged with a creatinine of 0.78.    Tachycardia.  Unclear if this was AFib  or SVT.  Cardiology increased his metoprolol to 25 t.i.d.    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    PAST SURGICAL HISTORY: Include coronary artery  grafting-back surgery -- anterior cervical discectomy fusion.      CURRENT MEDICATIONS:  Medication list is reviewed.             Albuterol inhaler 2 puffs every 6 hours when necessary.    Nexium 40 mg daily.    Feeding supplement ensure 3 times a day.    Glucophage thousand milligrams twice a day.    Lopressor 25 mg 3 times a day.    Nitroglycerin when necessary.    Percocet 7.5-325 mg one tablet every 4 hours when necessary.    Oxygen 4-6 L continuous.    Klonopin 1 mg twice a day.          Social history patient until recently apparently did smoke cigarettes about half a pack a day.  Apparently no smokeless tobacco history-alcohol or illicit drug history.  Family history significant for heart attack in his father and mother-as well as brother.        REVIEW OF SYSTEMS:   Not possible secondary patient has minimal verbalization.    PHYSICAL EXAMINATION:    Temperature 98.3 pulse 88 respirations 18 blood pressure 114/64    GENERAL APPEARANCE:  The patient is quite frail but alert and does speak although fairly minimally  The skin is warm and dry he does appear to have some chronic bruising more noticeable upper extremities.   Eyes pupils appear reactive light visual acuity appears grossly intact.  Oropharynx is clear mucous membranes appear fairly moist.  Chest is clear to auscultation there is no labored breathing.  There is poor respiratory effort.  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    Musculoskeletal does move all extremities 4 grip strength appears to be intact  I do not note any significant deformities he is able to move all extremities.--He has diffuse muscle wasting  Neurologic appears to be grossly intact he moves all extremities he does have somewhat of a mumbling speech which I suspect is not new-  Psych again does not really speak much--he is alert and does make eye contact and follows verbal commands  Labs.  05/03/2014.  Sodium 144 potassium 3.5 BUN 17 creatinine 0.86.  WBC 5.6-hemoglobin 12.3-  05/02/2015.  Sodium 140 potassium 3.4 BUN 18 creatinine 0.78 CO2 level XXVIII.  WBC 6.8 hemoglobin 11.7 platelets 103.  Jan   second 2017.  Liver function tests within normal limits except albumin of 2.5.  Assessment plan  History of hypernatremia-this appears to be stable he is eating and  drinking somewhat better although prognosis continues to be quite poor will check a metabolic panel tomorrow.  Supraventricular tachycardia-determine no SVT but instead had multiple PVCs ventricular bigeminy and several episodes of nonsustained ventricular tachycardia-plan was to continue metoprolol at current dose of 25 mg a times a day.-Ray appears to be controlled currently  History of acute hypoxic and hypercarbic respiratory failure-was intubated for 12 days and neck stated on December 27-thought to be multifactorial secondary to acute CHF pneumonia COPD and pleural effusions.  Patient has had significant decline.  Prognosis is thought to be poor with chances of rehabilitation low.      History of CHF-patient's Lasix was discontinued in the hospital-at  this point appears to be stable but he is a very fragile individual.  History of diabetes type 2 at this point appears stable on Glucophage recent blood sugars 98-104-146  History of COPD at this point appears stable but again a very fragile individual he is on albuterol inhaler as needed-also continues on chronic oxygen.  Again per hospital was a patient medications have been minimized his Norvasc Lipitor Neurontin Amaryl and Lasix have been discontinued-   .    [id number]--

## 2015-05-12 NOTE — Progress Notes (Addendum)
Patient ID: Miguel Mendoza, male   DOB: 1941/03/19, 75 y.o.   MRN: [id number]                HISTORY & PHYSICAL  DATE:  05/08/2015      FACILITY: [facility]                LEVEL OF CARE:   SNF   CHIEF COMPLAINT:  Readmission to the facility.      HISTORY OF PRESENT ILLNESS:  This is a patient whom I saw once on 04/29/2015.   He had previously been at [facility] from 04/21/2015 through 04/26/2015.  He was placed on BiPAP, but he also required intubation.    He had a right greater than left pleural effusion and a right-sided thoracentesis, felt to be a parapneumonic exudative effusion.  He was treated with antibiotics.  The cause of his respiratory failure was felt to be COPD acute secondary to pneumonia.    The day I saw him, he was minimally responsive.  His sodium was 156.  I ordered fluid resuscitation including 2 L of normal saline before switching to half-normal saline.  These orders were written on 04/29/2015.  His sodium on 04/30/2015 was 156.  On arrival to hospital, his sodium was actually 140, BUN 18, creatinine 0.78.  This was difficult to explain.  There is no way that he had received the quantity of fluid that would have been necessary to correct his sodium.    In any case, he was admitted to hospital with SVT or some form of tachycardia.  It was felt that this was probably secondary to his underlying dehydration.  His rhythm was felt to show multiple PVCs, bigeminy, and nonsustained V-tach.  He was recommended for metoprolol at 25 three times a day.    He was noted in the hospital to have declining status.  He was seen by Palliative Care.  There was an extensive end-of-life discussion, although I do not really know the outcome of this.  Right now, he is a DNR but does not have any other directions.     PAST MEDICAL HISTORY/PROBLEM LIST:  Past Medical History  Diagnosis Date  . CAD (coronary artery disease)     Multivessel status post stent to LAD 1998 then CABG  2003  . Myocardial infarction (HCC)   . History of stroke 37  . Type 2 diabetes mellitus (HCC)   . Essential hypertension   . Dyslipidemia   . Migraine headache   . GERD (gastroesophageal reflux disease)   . Anxiety   . Cervical disc disease      PAST SURGICAL HISTORY:   Past Surgical History  Procedure Laterality Date  . Coronary artery bypass graft  2003    Dr. Cornelius Moras - LIMA to LAD, SVG to OM, SVG to RCA  . Carotid endarterectomy Left   . Back surgery    . Anterior cervical decomp/discectomy fusion        CURRENT MEDICATIONS:  Medication list is reviewed.            Proventil 2 puffs q.6 p.r.n.     Nexium 40 daily.    Metformin 1000 b.i.d.      Lopressor 25 three times daily.    Nitroglycerin 0.4 p.r.n.     Oxycodone 7.5/325, 1 tablet every 4 hours p.r.n.      Oxygen 4 L.     Clonazepam 1 mg twice a day.    SOCIAL HISTORY:  At this point, I do not have any further information on this man.                  FAMILY HISTORY:  Family History  Problem Relation Age of Onset  . Heart attack Father   . Heart attack Mother   . Heart attack Brother       REVIEW OF SYSTEMS:   Not possible secondary to continued altered mental status.    PHYSICAL EXAMINATION:   VITAL SIGNS:     PULSE:  76.   RESPIRATIONS:  12.   02 SATURATIONS:  In the low 80s on room air.  With oxygen at 4 L, he is in the low 90s.   GENERAL APPEARANCE:  The patient is minimally responsive to vocal stimuli.  His speech is incomprehensible.   HEENT; oral exam is normal. No meningeal signs.  CHEST/RESPIRATORY:  Very poor air entry bilaterally.  No accessory muscle use. WOB is normal  CARDIOVASCULAR:   CARDIAC:  Heart sounds are normal.  He does not appear to be grossly dehydrated.     GASTROINTESTINAL:   ABDOMEN:  No masses.    LIVER/SPLEEN/KIDNEY:   No liver, no spleen.  No tenderness.    GENITOURINARY:   BLADDER:  No suprapubic or costovertebral angle tenderness.   CIRCULATION:     EDEMA/VARICOSITIES:  Extremities:  No edema.    PSYCHIATRIC:   MENTAL STATUS:  Once again, extremely lethargic.  Minimally responsive.  His speech is not comprehensible.  His exam is not lateralizing as far as I can tell.  ASSESSMENT/PLAN:             Continued acute confusional state/delirium.   The cause of this is not clear.  He had a CT scan of his head done on 04/11/2015 that showed no acute intracranial abnormality.  I had thought that this was related to hypernatremia, although this was rechecked last week at 144.  He does not look to be horribly dehydrated at the bedside.  I discussed things with the speech therapist.  He is on nectar-thick and is felt to be an extreme aspiration risk.    COPD with acute on chronic respiratory failure.   He is significantly hypoxic off his oxygen, which he apparently recurrently pulls off.   Hypernatremia: This seems to have corrected although the amount of free fluid he received seems insufficient to have done this. I find this difficult tot explain,.  Tachycardia: seems currently controlled  CAD: no overt evidence that this is active  I cannot imagine this patient is going to do well in the short term unless his mental status/deliriun imporves improves.  We do not have anything outside of a DNR in terms of limitations.  I think we will need to have a conversation with his family at some point to see if we can put some limitation on this.  Otherwise, even though there is a "do not send back to the hospital" instruction from the hospital, I think that is exactly where he is going to end up.

## 2015-05-13 ENCOUNTER — Other Ambulatory Visit: Payer: Self-pay | Admitting: *Deleted

## 2015-05-13 MED ORDER — OXYCODONE-ACETAMINOPHEN 7.5-325 MG PO TABS: 1.0000 | ORAL_TABLET | ORAL | Status: DC | PRN

## 2015-05-13 NOTE — Telephone Encounter (Signed)
[facility] 

## 2015-05-14 ENCOUNTER — Encounter: Payer: Self-pay | Admitting: Internal Medicine

## 2015-05-14 ENCOUNTER — Non-Acute Institutional Stay (SKILLED_NURSING_FACILITY): Payer: Medicare HMO | Admitting: Internal Medicine

## 2015-05-14 DIAGNOSIS — I10 Essential (primary) hypertension: Secondary | ICD-10-CM | POA: Diagnosis not present

## 2015-05-14 DIAGNOSIS — I472 Ventricular tachycardia: Secondary | ICD-10-CM | POA: Diagnosis not present

## 2015-05-14 DIAGNOSIS — I4729 Other ventricular tachycardia: Secondary | ICD-10-CM

## 2015-05-14 NOTE — Progress Notes (Signed)
Patient ID: Miguel Mendoza, male   DOB: [birth year], 75 y.o.   MRN: [id number]                     DATE:  05/14/2015      FACILITY: [facility]                  LEVEL OF CARE:   SNF   CHIEF COMPLAINT:  Q visit follow-up question hypotension     HISTORY OF PRESENT ILLNESS:  This is a 75 year-old man with quite a complex medical history-he recently was admitted  to the facility but readmitted to the hospital with severe hypernatremia. Marland Kitchen  He also had what was diagnosis supraventricular tachycardia-this was controlled on metoprolol 25 mg 3 times a day.  His hypernatremia resolved with IV Lasix sodium on discharge was 140-updated lab January 11 shows sodium of 144  Previously patient was admitted to the hospital with dyspnea.  He initially was placed on BiPAP.  On 04/12/2015, he was intubated due to progression of respiratory failure.  He was extubated on 04/23/2015.    He had a right greater than left pleural effusion.   He had a right-sided thoracentesis showing 270 mL of what is quoted as a parapneumonic exudative effusion.  He had been on antibiotics for 12 days.     the cause of his respiratory failure was felt to be COPD with pneumonia.  After most recent hospitalization recommendation was for discharge to skilled nursing with palliative care following-- Dr. Leanord Hawking did see last week and ordered a hospice consult this was completed yesterday-hospice is requesting possibly that Toprol be reduced her discontinued secondary to low blood pressure appears by machine it was at one point yesterday 80/50 manually 96/56-have reviewed his blood pressures-appears they run 106/69-113/76-138/74-146/72-patient does again have a history of tachycardia appears pulses run from the 80s to occasionally just above the 100.  Today his pulse is 82 on exam his blood pressures is 128/78   Of note patient now has an indwelling Foley catheter secondary to urinary retention-apparently he had  significant relief when this was inserted last Friday.  He is also has a buttocks rash which is being treated with topical treatment he also received a short course of Diflucan.  Marland Kitchen          PAST MEDICAL HISTORY/PROBLEM LIST:   Reviewed.      Acute hypoxic and hypercarbic respiratory failure, felt to be secondary to COPD; right greater than left pleural effusion; CHF; and pneumonia.  Extubated on 04/23/2015.    Exudative pleural effusion, which was felt to be parapneumonic.    Community-acquired pneumonia.  He has completed antibiotics.    COPD with acute exacerbation.     Diabetes.  Started on a sliding scale due to ongoing steroids.    Acute on chronic diastolic heart failure.  A 2D-echo in October showed an EF of 55-60% with grade 1 diastolic dysfunction.    Hypernatremia.   Discharged from the hospital with a sodium of 140   History of coronary artery disease.  Not felt to be unstable, yet a mildly elevated troponin at 0.05.    Acute renal failure.  Discharged with a creatinine of 0.78.    Tachycardia.  Unclear if this was AFib or SVT.  Cardiology increased his metoprolol to 25 t.i.d.    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    PAST SURGICAL HISTORY: Include coronary artery  grafting-back surgery --  anterior cervical discectomy fusion.      CURRENT MEDICATIONS:  Medication list is reviewed.             Albuterol inhaler 2 puffs every 6 hours when necessary.    Nexium 40 mg daily.    Feeding supplement ensure 3 times a day.    Glucophage thousand milligrams twice a day.    Lopressor 25 mg 3 times a day.    Nitroglycerin when necessary.    Percocet 7.5-325 mg one tablet every 4 hours when necessary.    Oxygen 4-6 L continuous.    Klonopin 1 mg twice a day.         Social history patient until recently apparently did smoke cigarettes about half a pack a day.  Apparently no smokeless tobacco history-alcohol or  illicit drug history.  Family history significant for heart attack in his father and mother-as well as brother.        REVIEW OF SYSTEMS:   Very limited since patient is a poor historian-however today he says he is feeling better he is somewhat more talkative which is encouraging.    PHYSICAL EXAMINATION:     Temperature 98.5 pulse 82 respirations 18 blood pressure taken manually 128/78    GENERAL APPEARANCE:  The patient is quite frail but alert and does speakmore than I have seen him in the past  The skin is warm and dry he does appear to have some chronic bruising more noticeable upper extremities. He has a confluent erythematous rash on his buttocks-this appears slightly improved from what I sawast week   Eyes pupils appear reactive light visual acuity appears grossly intact.  Oropharynx is clear mucous membranes appear slightly dry.  Chest is clear to auscultation there is no labored breathing. But shallow air entry  There is poor respiratory effort.  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  GU he does have a Foley catheter draining amber colored urine--I do not note any significant suprapubic distention-or pain.  Musculoskeletal does move all extremities 4 grip strength appears to be intact-trength difficult to excess since patient is in bed but I do not note any significant deformities he is able to move all extremities.--He has diffuse muscle wasting  Neurologic appears to be grossly intact he moves all extremities   Psych-appears to be feeling better He is the most alert and talkative since I have seen him in the facility--says he is feeling better  Labs.  05/08/2015.  Sodium 144 potassium 3.8 BUN 18 creatinine 0.86.  05/04/2015.  WBC 5.6 hemoglobin 12.3 platelets 139  05/02/2015.  Sodium 140 potassium 3.4 BUN 18  creatinine 0.78 CO2 level XXVIII.  WBC 6.8 hemoglobin 11.7 platelets 103.  Generalized second 2017.  Liver function tests within normal limits except albumin of 2.5.  Assessment plan. History of tachycardia hypertension-this actually appears to be fairly stable I really do not see a systolic readings frequently under 100 apparently this did occur yesterday.  His heart rate appears to be controlled in the 80s to low 100s-blood pressure was stable today at 128/78 pulse was 82.  At this point will continue the Toprol but will have to be monitored orders to hold the Toprol for blood pressure systolically less than 100 he does receive the Toprol 3 times a day  Buttocks rash-will add Diflucan 100 mg daily for 3 days and  monitor he is followed by wound care as well.  Urinary retention-he did come in with a Foley catheter this was removed apparently shortly after admission-however he appears to be having urinary retention will have this reinserted secondary to urinary retention and comfort-per nursing he was much more comfortable after catheter was  Inserted this afternoon   [id number]

## 2015-05-15 ENCOUNTER — Encounter (HOSPITAL_COMMUNITY)
Admission: AD | Admit: 2015-05-15 | Discharge: 2015-05-15 | Disposition: A | Discharge status: Home / Self Care | Payer: Medicare HMO | Source: Skilled Nursing Facility | Attending: Internal Medicine | Admitting: Internal Medicine

## 2015-05-15 DIAGNOSIS — I10 Essential (primary) hypertension: Secondary | ICD-10-CM | POA: Diagnosis not present

## 2015-05-15 LAB — COMPREHENSIVE METABOLIC PANEL
ALBUMIN: 2.6 g/dL — AB (ref 3.5–5.0)
ALT: 13 U/L — ABNORMAL LOW (ref 17–63)
AST: 17 U/L (ref 15–41)
Alkaline Phosphatase: 56 U/L (ref 38–126)
Anion gap: 7 (ref 5–15)
BUN: 18 mg/dL (ref 6–20)
CHLORIDE: 100 mmol/L — AB (ref 101–111)
CO2: 36 mmol/L — ABNORMAL HIGH (ref 22–32)
Calcium: 8.9 mg/dL (ref 8.9–10.3)
Creatinine, Ser: 0.9 mg/dL (ref 0.61–1.24)
GFR calc Af Amer: >60 mL/min (ref >60)
GLUCOSE: 96 mg/dL (ref 65–99)
POTASSIUM: 3.3 mmol/L — AB (ref 3.5–5.1)
Sodium: 143 mmol/L (ref 135–145)
Total Bilirubin: 1 mg/dL (ref 0.3–1.2)
Total Protein: 5.4 g/dL — ABNORMAL LOW (ref 6.5–8.1)

## 2015-05-15 LAB — CBC WITH DIFFERENTIAL/PLATELET
Basophils Absolute: 0 10*3/uL (ref 0.0–0.1)
Basophils Relative: 1 %
EOS PCT: 5 %
Eosinophils Absolute: 0.2 10*3/uL (ref 0.0–0.7)
HCT: 38.6 % — ABNORMAL LOW (ref 39.0–52.0)
HEMOGLOBIN: 11.2 g/dL — AB (ref 13.0–17.0)
LYMPHS ABS: 1.7 10*3/uL (ref 0.7–4.0)
LYMPHS PCT: 39 %
MCH: 26.4 pg (ref 26.0–34.0)
MCHC: 29 g/dL — AB (ref 30.0–36.0)
MCV: 91 fL (ref 78.0–100.0)
Monocytes Absolute: 0.4 10*3/uL (ref 0.1–1.0)
Monocytes Relative: 8 %
NEUTROS ABS: 2.1 10*3/uL (ref 1.7–7.7)
Neutrophils Relative %: 47 %
PLATELETS: 177 10*3/uL (ref 150–400)
RBC: 4.24 MIL/uL (ref 4.22–5.81)
RDW: 20.4 % — ABNORMAL HIGH (ref 11.5–15.5)
WBC: 4.4 10*3/uL (ref 4.0–10.5)

## 2015-05-21 DIAGNOSIS — D6489 Other specified anemias: Secondary | ICD-10-CM | POA: Insufficient documentation

## 2015-05-22 ENCOUNTER — Encounter (HOSPITAL_COMMUNITY)
Admission: AD | Admit: 2015-05-22 | Discharge: 2015-05-22 | Disposition: A | Discharge status: Home / Self Care | Payer: Medicare HMO | Source: Skilled Nursing Facility | Attending: Internal Medicine | Admitting: Internal Medicine

## 2015-05-22 DIAGNOSIS — I10 Essential (primary) hypertension: Secondary | ICD-10-CM | POA: Diagnosis not present

## 2015-05-22 LAB — MAGNESIUM: MAGNESIUM: 1.5 mg/dL — AB (ref 1.7–2.4)

## 2015-05-22 LAB — BASIC METABOLIC PANEL
ANION GAP: 7 (ref 5–15)
BUN: 16 mg/dL (ref 6–20)
CHLORIDE: 102 mmol/L (ref 101–111)
CO2: 33 mmol/L — ABNORMAL HIGH (ref 22–32)
Calcium: 9 mg/dL (ref 8.9–10.3)
Creatinine, Ser: 0.8 mg/dL (ref 0.61–1.24)
Glucose, Bld: 103 mg/dL — ABNORMAL HIGH (ref 65–99)
POTASSIUM: 4 mmol/L (ref 3.5–5.1)
SODIUM: 142 mmol/L (ref 135–145)

## 2015-05-23 ENCOUNTER — Other Ambulatory Visit: Payer: Self-pay | Admitting: *Deleted

## 2015-05-23 MED ORDER — OXYCODONE-ACETAMINOPHEN 7.5-325 MG PO TABS: 1.0000 | ORAL_TABLET | ORAL | Status: DC | PRN

## 2015-05-23 NOTE — Telephone Encounter (Signed)
[facility] 

## 2015-05-29 ENCOUNTER — Encounter (HOSPITAL_COMMUNITY)
Admission: AD | Admit: 2015-05-29 | Discharge: 2015-05-29 | Disposition: A | Discharge status: Home / Self Care | Source: Skilled Nursing Facility | Attending: Internal Medicine | Admitting: Internal Medicine

## 2015-05-29 ENCOUNTER — Encounter: Payer: Self-pay | Admitting: Internal Medicine

## 2015-05-29 ENCOUNTER — Non-Acute Institutional Stay (SKILLED_NURSING_FACILITY): Payer: Medicare HMO | Admitting: Internal Medicine

## 2015-05-29 DIAGNOSIS — I471 Supraventricular tachycardia: Secondary | ICD-10-CM

## 2015-05-29 DIAGNOSIS — D6489 Other specified anemias: Secondary | ICD-10-CM | POA: Diagnosis not present

## 2015-05-29 DIAGNOSIS — I10 Essential (primary) hypertension: Secondary | ICD-10-CM | POA: Diagnosis not present

## 2015-05-29 DIAGNOSIS — R339 Retention of urine, unspecified: Secondary | ICD-10-CM | POA: Diagnosis not present

## 2015-05-29 DIAGNOSIS — R627 Adult failure to thrive: Secondary | ICD-10-CM | POA: Diagnosis not present

## 2015-05-29 LAB — CBC
HEMATOCRIT: 32 % — AB (ref 39.0–52.0)
HEMOGLOBIN: 9.5 g/dL — AB (ref 13.0–17.0)
MCH: 28.2 pg (ref 26.0–34.0)
MCHC: 29.7 g/dL — ABNORMAL LOW (ref 30.0–36.0)
MCV: 95 fL (ref 78.0–100.0)
PLATELETS: 204 10*3/uL (ref 150–400)
RBC: 3.37 MIL/uL — AB (ref 4.22–5.81)
RDW: 19.9 % — ABNORMAL HIGH (ref 11.5–15.5)
WBC: 5.2 10*3/uL (ref 4.0–10.5)

## 2015-05-29 LAB — BASIC METABOLIC PANEL
ANION GAP: 5 (ref 5–15)
BUN: 28 mg/dL — ABNORMAL HIGH (ref 6–20)
CALCIUM: 9.2 mg/dL (ref 8.9–10.3)
CHLORIDE: 99 mmol/L — AB (ref 101–111)
CO2: 36 mmol/L — AB (ref 22–32)
Creatinine, Ser: 1.05 mg/dL (ref 0.61–1.24)
GFR calc Af Amer: >60 mL/min (ref >60)
GFR calc non Af Amer: >60 mL/min (ref >60)
GLUCOSE: 101 mg/dL — AB (ref 65–99)
Potassium: 4.5 mmol/L (ref 3.5–5.1)
Sodium: 140 mmol/L (ref 135–145)

## 2015-05-29 LAB — MAGNESIUM: Magnesium: 1.6 mg/dL — ABNORMAL LOW (ref 1.7–2.4)

## 2015-05-29 NOTE — Progress Notes (Signed)
Patient ID: Miguel Mendoza, male   DOB: July 14, 1940, 75 y.o.   MRN: [id number]                         FACILITY: [facility]                  LEVEL OF CARE:   SNF   CHIEF COMPLAINT Acute visit secondary to anemia-follow up failure to thrive:  --Urinary retention      HISTORY OF PRESENT ILLNESS:  This is a 75 year-old man with quite a complex medical history-he recently was admitted  to the facility but readmitted to the hospital with severe hypernatremia. Marland Kitchen  He also had what was diagnosis supraventricular tachycardia-this was controlled on metoprolol 25 mg 3 times a day.  His hypernatremia resolved with IV Lasix sodium on discharge was 140-updated lab January 11 shows sodium of 144--this continues to be stable with a sodium of 140 on lab done today February 1.    Previously patient was admitted to the hospital with dyspnea.  He initially was placed on BiPAP.  On 04/12/2015, he was intubated due to progression of respiratory failure.  He was extubated on 04/23/2015.    He had a right greater than left pleural effusion.   He had a right-sided thoracentesis showing 270 mL of what is quoted as a parapneumonic exudative effusion.  He had been on antibiotics for 12 days.     the cause of his respiratory failure was felt to be COPD with pneumonia.  He has made marked improvement during his stay here-actually at one point had been on on hospice but this was discontinued secondary to him doing considerably better.  He is eating better he is more alert more interactive participating with therapy this is very encouraging.  Vital signs are stable at one point his blood pressure was running low but has stabilized recent blood pressures 116/64 106/50-124/68 I do see one listed 95/56 but this does not appear to be frequent.  We have been monitoring his hemoglobin in this appears to be going down somewhat at one point early in his hospitalization apparently was around 12 this  did come down once to 11.2 and on lab done today is 9.5.  He denies any history of rectal bleeding I do not see significant history per review of his records although they are somewhat limited  Clinically he appears to be doing much better he does not complain of decreased energy in fact he appears to be stronger now.  He did have a history of urinary retention--and a Foley was reinserted secondary to significant discomfort lbladder distention-this has been stable for some time now he is more active more alert and we will do a trial course off the catheter   Again clinically he appears to be doing quite well-but his hemoglobin does continue to drop.       Marland Kitchen          PAST MEDICAL HISTORY/PROBLEM LIST:   Reviewed.      Acute hypoxic and hypercarbic respiratory failure, felt to be secondary to COPD; right greater than left pleural effusion; CHF; and pneumonia.  Extubated on 04/23/2015.    Exudative pleural effusion, which was felt to be parapneumonic.    Community-acquired pneumonia.  He has completed antibiotics.    COPD with acute exacerbation.     Diabetes.  Started on a sliding scale due to ongoing steroids.    Acute on  chronic diastolic heart failure.  A 2D-echo in October showed an EF of 55-60% with grade 1 diastolic dysfunction.    Hypernatremia.   D  History of coronary artery disease.  Not felt to be unstable, yet a mildly elevated troponin at 0.05.    Acute renal failure.  Discharged with a creatinine of 0.78. Most recent creatinine 1.05 on lab done today    Tachycardia.  Unclear if this was AFib or SVT.  Cardiology increased his metoprolol to 25 t.i.d.    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    PAST SURGICAL HISTORY: Include coronary artery  grafting-back surgery -- anterior cervical discectomy fusion.      CURRENT MEDICATIONS:  Medication list is reviewed.             Albuterol inhaler 2 puffs every 6 hours when  necessary.    Nexium 40 mg daily.    Feeding supplement ensure 3 times a day.    Glucophage thousand milligrams twice a day.    Lopressor 25 mg 3 times a day.    Nitroglycerin when necessary.    Percocet 7.5-325 mg one tablet every 4 hours when necessary.    Oxygen 4-6 L continuous.    Klonopin 0.5 mg twice a day.    Potassium 20 mEq daily         Social history patient until recently apparently did smoke cigarettes about half a pack a day.  Apparently no smokeless tobacco history-alcohol or illicit drug history.  Family history significant for heart attack in his father and mother-as well as brother.        REVIEW OF SYSTEMS:    Gen. patient appears to be doing much better clinically.  Skin does not complain of rashes or itching he did have a buttocks rash which appears to be improving fairly significantly with topical treatment.  Head ears eyes nose mouth and throat no completes visual changes or sore throat.  Her story does not complain of shortness breath or cough.  Cardiac does not complain of chest pain has minimal lower extremity edema.  GI does not complain of abdominal pain nausea vomiting diarrhea or constipation currently.  GU  Does not complain of dysuria still has an indwelling Foley catheter we will do a trial course off this as noted above.  Muscle skeletal continues to gain a significant amount of strength although he still quite weak does not complain of joint pain.  Neurologic does not complain of dizziness headache or syncopal-type feelings.  Psych appears to be in good spirits does not appear depressed or anxious actually appears to be quite interactive which is a significant change from previous visits.  Physical exam.  Temperature is 97.5 pulse 73 respirations 22 blood pressure 116/64.  In general this is a frail elderly male who looks significantly improved and stronger over the past couple weeks.  His skin is warm  and dry.--But tock rash appears to be resolving unremarkably much improved from when I last saw Eyes pupils appear reactive to light sclera and conjunctiva are clear.  Oropharynx clear mucous membranes moist.  Chest is clear to auscultation there is no labored breathing.  Heart is regular rate and rhythm without murmur gallop or rub he has trace lower extremity edema.  Abdomen is soft nontender positive bowel sounds.  Rectal I did do an occult blood testing digital exam which was negative  GU does have a Foley catheter draining amber colored urine I do not note any  bladder distention or discharge.  Muscle skeletal is able to move all extremities 4 has gained a significant amount of strength I do not note any deformities other than arthritic.  Neurologic is grossly intact to speech is clear he is bright alert and interactive no lateralizing findings.  Psych as noted above--is alert and interactive.  Significant improvement from previous exams.  Labs.  05/28/2014.  Sodium 140 potassium 4.5 BUN 28 creatinine 1.05 CO2 level XXXVI which is baseline.  Magnesium is 1.6 it was 1.5 it is slowly rising he is on magnesium supplementation.  WBC 5.2 hemoglobin 9.5 MCV is 95.  Assessment plan.  #1-anemia-his hemoglobin is dropping.--He does not appear to be symptomatic actually appearing more energetic stronger-occult blood testing was negative today-will continue this 3.  Also will order anemia panel including serum iron total iron-binding capacity reticulocyte count and ferritin-also a B12 and folate-will update a hemoglobin as well next week to monitor progress.  Per chart review I do not see any previous colonoscopy or GI consult-this may have to be pursued but clinically he appears to be doing quite well  #2 failure to thrive-he is doing significantly better here sodium has stabilized he is eating and drinking better this appears to be a significant turnaround which is  encouraging--he actually has been discharged from hospice.  #3 history of urinary retention as noted above we'll discontinue the Foley and monitor.  #4 history of hypokalemia this has normalized on potassium supplementation he is also on magnesium supplementation for a slightly low magnesium level.    #5 History of tachycardia hypertension Appears to be stable recent pulses in the 70s blood pressure appears to be stable --I only see one systolic less than 100 he is asymptomatic at this point continue the Toprol 25 mg 3 times a day.   .  #6 Buttocks rash- This appears much improved    [id number]-of note greater than 35 minute minutes spent assessing patient-discussing his status with nursing staff-reviewing his chart-and coordinating formulating a plan of care for numerous diagnoses-of note greater than 50% of time spent coordinating plan of care with review of chart to assess his anemia and history or workup of any rectal bleeding

## 2015-05-30 ENCOUNTER — Encounter (HOSPITAL_COMMUNITY)
Admission: AD | Admit: 2015-05-30 | Discharge: 2015-05-30 | Disposition: A | Discharge status: Home / Self Care | Source: Skilled Nursing Facility | Attending: Internal Medicine | Admitting: Internal Medicine

## 2015-05-30 DIAGNOSIS — I10 Essential (primary) hypertension: Secondary | ICD-10-CM | POA: Diagnosis not present

## 2015-05-30 LAB — RETICULOCYTES
RBC.: 3.46 MIL/uL — AB (ref 4.22–5.81)
RETIC CT PCT: 1.9 % (ref 0.4–3.1)
Retic Count, Absolute: 65.7 10*3/uL (ref 19.0–186.0)

## 2015-05-30 LAB — FOLATE: FOLATE: 10 ng/mL (ref >5.9)

## 2015-05-30 LAB — FERRITIN: Ferritin: 219 ng/mL (ref 24–336)

## 2015-05-30 LAB — IRON AND TIBC
Iron: 112 ug/dL (ref 45–182)
Saturation Ratios: 40 % — ABNORMAL HIGH (ref 17.9–39.5)
TIBC: 280 ug/dL (ref 250–450)
UIBC: 168 ug/dL

## 2015-05-30 LAB — VITAMIN B12: VITAMIN B 12: 184 pg/mL (ref 180–914)

## 2015-05-31 ENCOUNTER — Encounter (HOSPITAL_COMMUNITY)
Admission: RE | Admit: 2015-05-31 | Discharge: 2015-05-31 | Disposition: A | Discharge status: Home / Self Care | Source: Skilled Nursing Facility | Attending: Internal Medicine | Admitting: Internal Medicine

## 2015-05-31 DIAGNOSIS — R933 Abnormal findings on diagnostic imaging of other parts of digestive tract: Secondary | ICD-10-CM | POA: Diagnosis not present

## 2015-05-31 LAB — OCCULT BLOOD X 1 CARD TO LAB, STOOL: FECAL OCCULT BLD: NEGATIVE

## 2015-06-01 ENCOUNTER — Encounter (HOSPITAL_COMMUNITY)
Admission: RE | Admit: 2015-06-01 | Discharge: 2015-06-01 | Disposition: A | Discharge status: Home / Self Care | Source: Skilled Nursing Facility | Attending: Internal Medicine | Admitting: Internal Medicine

## 2015-06-01 DIAGNOSIS — I10 Essential (primary) hypertension: Secondary | ICD-10-CM | POA: Diagnosis not present

## 2015-06-01 LAB — OCCULT BLOOD X 1 CARD TO LAB, STOOL
FECAL OCCULT BLD: NEGATIVE
FECAL OCCULT BLD: NEGATIVE

## 2015-06-03 ENCOUNTER — Encounter (HOSPITAL_COMMUNITY)
Admission: RE | Admit: 2015-06-03 | Discharge: 2015-06-03 | Disposition: A | Discharge status: Home / Self Care | Source: Skilled Nursing Facility | Attending: Internal Medicine | Admitting: Internal Medicine

## 2015-06-03 DIAGNOSIS — I10 Essential (primary) hypertension: Secondary | ICD-10-CM | POA: Diagnosis not present

## 2015-06-03 LAB — CBC WITH DIFFERENTIAL/PLATELET
BASOS ABS: 0 10*3/uL (ref 0.0–0.1)
BASOS PCT: 0 %
Eosinophils Absolute: 0.2 10*3/uL (ref 0.0–0.7)
Eosinophils Relative: 3 %
HEMATOCRIT: 33.3 % — AB (ref 39.0–52.0)
Hemoglobin: 9.9 g/dL — ABNORMAL LOW (ref 13.0–17.0)
LYMPHS PCT: 35 %
Lymphs Abs: 2 10*3/uL (ref 0.7–4.0)
MCH: 28.4 pg (ref 26.0–34.0)
MCHC: 29.7 g/dL — ABNORMAL LOW (ref 30.0–36.0)
MCV: 95.7 fL (ref 78.0–100.0)
MONO ABS: 0.4 10*3/uL (ref 0.1–1.0)
Monocytes Relative: 6 %
NEUTROS ABS: 3.2 10*3/uL (ref 1.7–7.7)
NEUTROS PCT: 56 %
Platelets: 207 10*3/uL (ref 150–400)
RBC: 3.48 MIL/uL — AB (ref 4.22–5.81)
RDW: 19.9 % — AB (ref 11.5–15.5)
WBC: 5.8 10*3/uL (ref 4.0–10.5)

## 2015-06-03 LAB — BASIC METABOLIC PANEL
ANION GAP: 5 (ref 5–15)
BUN: 41 mg/dL — ABNORMAL HIGH (ref 6–20)
CALCIUM: 9.7 mg/dL (ref 8.9–10.3)
CO2: 36 mmol/L — ABNORMAL HIGH (ref 22–32)
Chloride: 98 mmol/L — ABNORMAL LOW (ref 101–111)
Creatinine, Ser: 1.19 mg/dL (ref 0.61–1.24)
GFR, EST NON AFRICAN AMERICAN: 59 mL/min — AB (ref >60)
Glucose, Bld: 100 mg/dL — ABNORMAL HIGH (ref 65–99)
POTASSIUM: 5.4 mmol/L — AB (ref 3.5–5.1)
SODIUM: 139 mmol/L (ref 135–145)

## 2015-06-04 ENCOUNTER — Encounter: Payer: Self-pay | Admitting: Internal Medicine

## 2015-06-04 ENCOUNTER — Encounter (HOSPITAL_COMMUNITY)
Admission: AD | Admit: 2015-06-04 | Discharge: 2015-06-04 | Disposition: A | Discharge status: Home / Self Care | Source: Skilled Nursing Facility | Attending: Internal Medicine | Admitting: Internal Medicine

## 2015-06-04 ENCOUNTER — Non-Acute Institutional Stay (SKILLED_NURSING_FACILITY): Payer: Medicare HMO | Admitting: Internal Medicine

## 2015-06-04 DIAGNOSIS — F411 Generalized anxiety disorder: Secondary | ICD-10-CM | POA: Diagnosis not present

## 2015-06-04 DIAGNOSIS — R42 Dizziness and giddiness: Secondary | ICD-10-CM | POA: Diagnosis not present

## 2015-06-04 DIAGNOSIS — I10 Essential (primary) hypertension: Secondary | ICD-10-CM | POA: Diagnosis not present

## 2015-06-04 DIAGNOSIS — D649 Anemia, unspecified: Secondary | ICD-10-CM

## 2015-06-04 DIAGNOSIS — E875 Hyperkalemia: Secondary | ICD-10-CM | POA: Diagnosis not present

## 2015-06-04 LAB — BASIC METABOLIC PANEL
Anion gap: 8 (ref 5–15)
BUN: 41 mg/dL — AB (ref 6–20)
CALCIUM: 10.2 mg/dL (ref 8.9–10.3)
CO2: 35 mmol/L — ABNORMAL HIGH (ref 22–32)
CREATININE: 1.33 mg/dL — AB (ref 0.61–1.24)
Chloride: 96 mmol/L — ABNORMAL LOW (ref 101–111)
GFR calc Af Amer: 60 mL/min — ABNORMAL LOW (ref >60)
GFR, EST NON AFRICAN AMERICAN: 51 mL/min — AB (ref >60)
GLUCOSE: 107 mg/dL — AB (ref 65–99)
POTASSIUM: 5.5 mmol/L — AB (ref 3.5–5.1)
SODIUM: 139 mmol/L (ref 135–145)

## 2015-06-04 NOTE — Progress Notes (Signed)
Patient ID: Miguel Mendoza, male   DOB: [birth year], 75 y.o.   MRN: [id number]                        This is an acute visit  FACILITY: [facility]                  LEVEL OF CARE:   SNF   CHIEF COMPLAINT Acute visit secondary to hyperkalemia-dizziness-follow-up anemia-and anxiety      HISTORY OF PRESENT ILLNESS:  This is a 75 year-old man with quite a complex medical history-he recently was admitted  to the facility but readmitted to the hospital with severe hypernatremia. Marland Kitchen  He also had what was diagnosis supraventricular tachycardia-this was controlled on metoprolol 25 mg 3 times a day.  His hypernatremia resolved with IV Lasix sodium on discharge was 140-updated lab January 11 shows sodium of 144--this continues to be stable with a sodium of 139 on lab done on February 7.  Lab also however shows a potassium of 5.5-at one point his potassium was low and this was supplemented we will discontinue his potassium  and recheck this.      Previously patient was admitted to the hospital with dyspnea.  He initially was placed on BiPAP.  On 04/12/2015, he was intubated due to progression of respiratory failure.  He was extubated on 04/23/2015.    He had a right greater than left pleural effusion.   He had a right-sided thoracentesis showing 270 mL of what is quoted as a parapneumonic exudative effusion.  He had been on antibiotics for 12 days.     the cause of his respiratory failure was felt to be COPD with pneumonia.  He has made marked improvement during his stay here-actually at one point had been on on hospice but this was discontinued secondary to him doing considerably better.    We have been monitoring his hemoglobin in this appeared to be going down somewhat at one point early in his hospitalization apparently was around 12 this did come down once to 11.2 and on lab done initially was 9.5-this has since come up to 9.9 on lab done yesterday February 6.  Iron  panel showed a total iron binding capacity of 280 which is low-normal iron was within normal range at 112 reticulocyte normal at 1.9 B12 was borderline low at 184 folate was within normal range  He denies any history of rectal bleeding I do not see significant history per review of his records although they are somewhat limited--so far his stool guaicas been negative 3 and it was negative when I tested it initially as well.  Another issue is some dizziness patient reports this is of short duration is more when he moves his head and lays down on hispillow orget up off the bed-sayst is this is very transitory is not associated with any shortness of breath or chest pain or visual changes     He did have a history of urinary retention--and a Foley was reinserted secondary to significant discomfort lbladder distention- However he is now more mobile active doing considerably better-his Foley has been removed and apparently he is voiding well per nursing  Another issue apparently was anxiety he is on Klonopin 0.5 twice a day routinely-apparently had increased anxiety yesterday and received an extra dose-however today he appears to be at his baseline he is calm conversant upbeat appearing-at this point I do not believe increase in his Klonopin  dose or frequency would be warranted -- nursing staff is in agreement         .          PAST MEDICAL HISTORY/PROBLEM LIST:   Reviewed.      Acute hypoxic and hypercarbic respiratory failure, felt to be secondary to COPD; right greater than left pleural effusion; CHF; and pneumonia.  Extubated on 04/23/2015.    Exudative pleural effusion, which was felt to be parapneumonic.    Community-acquired pneumonia.  He has completed antibiotics.    COPD with acute exacerbation.     Diabetes.  Started on a sliding scale due to ongoing steroids.    Acute on chronic diastolic heart failure.  A 2D-echo in October showed an EF of 55-60% with grade 1 diastolic  dysfunction.    Hypernatremia.   D  History of coronary artery disease.  Not felt to be unstable, yet a mildly elevated troponin at 0.05.    Acute renal failure.  Discharged with a creatinine of 0.78. Most recent creatinine 1.05 on lab done today    Tachycardia.  Unclear if this was AFib or SVT.  Cardiology increased his metoprolol to 25 t.i.d.    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    PAST SURGICAL HISTORY: Include coronary artery  grafting-back surgery -- anterior cervical discectomy fusion.      CURRENT MEDICATIONS:  Medication list is reviewed.             Albuterol inhaler 2 puffs every 6 hours when necessary.    Nexium 40 mg daily.    Feeding supplement ensure 3 times a day.    Glucophage thousand milligrams twice a day.    Lopressor 25 mg 3 times a day.    Nitroglycerin when necessary.    Percocet 7.5-325 mg one tablet every 4 hours when necessary.    Oxygen 4-6 L continuous.    Klonopin 0.5 mg twice a day.    Potassium 20 mEq daily-we will discontinue this         Social history patient until recently apparently did smoke cigarettes about half a pack a day.  Apparently no smokeless tobacco history-alcohol or illicit drug history.  Family history significant for heart attack in his father and mother-as well as brother.        REVIEW OF SYSTEMS:    Gen. patient appears to be improving steadily  Skin does not complain of rashes or itching he did have a buttocks rash which appears to be improving fairly significantly with topical treatment.  Head ears eyes nose mouth and throat no completes visual changes or sore throat.  Resp-es not complain of shortness breath or cough.  Cardiac does not complain of chest pain has minimal lower extremity edema.  GI does not complain of abdominal pain nausea vomiting diarrhea or constipation currently.  GU  Does not complain of dysuria  He is now voiding Foley catheter  has been removed-does have a history of urinary retention  Muscle skeletal continues to gain a significant amount of strength although he still quite weak does not complain of joint pain.  Neurologic does not cpmplain of headache or syncopal-type feelings.--However does complain of dizziness of short duration as noted above  Psych appears to be in good spirits does not appear depressed or anxious   Physical exam. Temp to 98.1 pulse 86 respirations 22 blood pressure--secondary to dizziness complaints did do orthostatics blood pressure was 122/68 lying--120/70 sitting-was also 120/70 standing  In  general this is a frail elderly male who looks significantly improved and stronger over the past several weeks.  His skin is warm and dry.-  Eyes pupils appear reactive to light sclera and conjunctiva are clear.  Oropharynx clear mucous membranes moist.  Chest is clear to auscultation there is no labored breathing.  Heart is regular rate and rhythm without murmur gallop or rub he has trace lower extremity edema.  Abdomen is soft nontender positive bowel sounds.     GU  Only catheter has been removed a do not appreciate suprapubic tenderness.  Muscle skeletal is able to move all extremities 4 has gained a significant amount of strength I do not note any deformities other than arthritic.  Neurologic is grossly intact to speech is clear he is bright alert and interactive no lateralizing findings--1 patient stood he complained of very transitory dizziness lasting only a few seconds.  Psych as noted above--is alert and interactive.  Significant improvement from previous exams.  Labs.  05/28/2014.  Sodium 140 potassium 4.5 BUN 28 creatinine 1.05 CO2 level XXXVI which is baseline.  Magnesium is 1.6 it was 1.5 it is slowly rising he is on magnesium supplementation.  WBC 5.2 hemoglobin 9.5 MCV is 95.  Assessment plan.  #1-anemia-his hemoglobin  Appears to have stabilized actually  slowly rising-lab work indicated an element of chronic disease-B12 is low normal consider adding B12 supplementation this was discussed with Dr. Leanord Hawking via phone.   Also update CBC next week  Per  previoushart review I do not see any previous colonoscopy or GI consult-this may have to be pursued but clinically he appears to be doing quite well  #2 failure to thrive-he is doing significantly better here sodium has stabilized he is eating and drinking better this appears to be a significant turnaround which is encouraging--   #3 history of urinary retention at this point appears resolved fully catheter is been removed  #4 history of hypokalemia now hyperkalemia This is now risen to supratherapeutic level at 5.5-potassium is been discontinued this will be rechecked in 2 days--    #5  History of dizziness-this appears very transitory and more position related-orthostatics were unrevealing-at this point suspicion this may be a transitory inner ear issue-at this point will monitor if this persists or becomes more symptomatic certainly will have to reconsider here.--He is in no discomfort or distress   #6-anxiety-as noted above this appears to have stabilized he is on Klonopin 0.5 mg twice a day at this point will not increase his Klonopin dose or frequency and appears anxiety yesterday was quite transitory but this will have to be watched .       [id number] note greater than 35 minute minutes spent assessing patient-discussing his status with nursing staff-reviewing his chart-and coordinating formulating a plan of care for numerous diagnoses-of note greater than 50% of time spent coordinating plan of care for numerous issues

## 2015-06-06 ENCOUNTER — Encounter (HOSPITAL_COMMUNITY)
Admission: AD | Admit: 2015-06-06 | Discharge: 2015-06-06 | Disposition: A | Discharge status: Home / Self Care | Source: Skilled Nursing Facility | Attending: Internal Medicine | Admitting: Internal Medicine

## 2015-06-06 DIAGNOSIS — I10 Essential (primary) hypertension: Secondary | ICD-10-CM | POA: Diagnosis not present

## 2015-06-06 LAB — BASIC METABOLIC PANEL
Anion gap: 6 (ref 5–15)
BUN: 41 mg/dL — AB (ref 6–20)
CO2: 36 mmol/L — ABNORMAL HIGH (ref 22–32)
Calcium: 10 mg/dL (ref 8.9–10.3)
Chloride: 99 mmol/L — ABNORMAL LOW (ref 101–111)
Creatinine, Ser: 1.21 mg/dL (ref 0.61–1.24)
GFR, EST NON AFRICAN AMERICAN: 58 mL/min — AB (ref >60)
Glucose, Bld: 103 mg/dL — ABNORMAL HIGH (ref 65–99)
POTASSIUM: 4.5 mmol/L (ref 3.5–5.1)
SODIUM: 141 mmol/L (ref 135–145)

## 2015-06-06 LAB — CBC WITH DIFFERENTIAL/PLATELET
BASOS ABS: 0 10*3/uL (ref 0.0–0.1)
Basophils Relative: 0 %
EOS ABS: 0.3 10*3/uL (ref 0.0–0.7)
EOS PCT: 5 %
HCT: 33.7 % — ABNORMAL LOW (ref 39.0–52.0)
Hemoglobin: 10.3 g/dL — ABNORMAL LOW (ref 13.0–17.0)
LYMPHS PCT: 45 %
Lymphs Abs: 2.4 10*3/uL (ref 0.7–4.0)
MCH: 29.2 pg (ref 26.0–34.0)
MCHC: 30.6 g/dL (ref 30.0–36.0)
MCV: 95.5 fL (ref 78.0–100.0)
Monocytes Absolute: 0.3 10*3/uL (ref 0.1–1.0)
Monocytes Relative: 6 %
Neutro Abs: 2.4 10*3/uL (ref 1.7–7.7)
Neutrophils Relative %: 44 %
PLATELETS: 200 10*3/uL (ref 150–400)
RBC: 3.53 MIL/uL — AB (ref 4.22–5.81)
RDW: 20.3 % — ABNORMAL HIGH (ref 11.5–15.5)
WBC: 5.4 10*3/uL (ref 4.0–10.5)

## 2015-06-10 ENCOUNTER — Encounter (HOSPITAL_COMMUNITY)
Admission: RE | Admit: 2015-06-10 | Discharge: 2015-06-10 | Disposition: A | Discharge status: Home / Self Care | Source: Skilled Nursing Facility | Attending: Internal Medicine | Admitting: Internal Medicine

## 2015-06-10 DIAGNOSIS — I10 Essential (primary) hypertension: Secondary | ICD-10-CM | POA: Diagnosis not present

## 2015-06-10 LAB — BASIC METABOLIC PANEL
ANION GAP: 7 (ref 5–15)
BUN: 31 mg/dL — AB (ref 6–20)
CHLORIDE: 104 mmol/L (ref 101–111)
CO2: 31 mmol/L (ref 22–32)
Calcium: 9.3 mg/dL (ref 8.9–10.3)
Creatinine, Ser: 0.93 mg/dL (ref 0.61–1.24)
GFR calc non Af Amer: >60 mL/min (ref >60)
Glucose, Bld: 105 mg/dL — ABNORMAL HIGH (ref 65–99)
POTASSIUM: 4.2 mmol/L (ref 3.5–5.1)
SODIUM: 142 mmol/L (ref 135–145)

## 2015-06-10 LAB — CBC
HEMATOCRIT: 30.3 % — AB (ref 39.0–52.0)
HEMOGLOBIN: 9.3 g/dL — AB (ref 13.0–17.0)
MCH: 29.5 pg (ref 26.0–34.0)
MCHC: 30.7 g/dL (ref 30.0–36.0)
MCV: 96.2 fL (ref 78.0–100.0)
Platelets: 188 10*3/uL (ref 150–400)
RBC: 3.15 MIL/uL — AB (ref 4.22–5.81)
RDW: 20.5 % — ABNORMAL HIGH (ref 11.5–15.5)
WBC: 4.5 10*3/uL (ref 4.0–10.5)

## 2015-06-17 ENCOUNTER — Encounter (HOSPITAL_COMMUNITY)
Admission: RE | Admit: 2015-06-17 | Discharge: 2015-06-17 | Disposition: A | Discharge status: Home / Self Care | Source: Skilled Nursing Facility | Attending: Internal Medicine | Admitting: Internal Medicine

## 2015-06-17 DIAGNOSIS — I10 Essential (primary) hypertension: Secondary | ICD-10-CM | POA: Diagnosis not present

## 2015-06-17 LAB — CBC
HEMATOCRIT: 30.9 % — AB (ref 39.0–52.0)
Hemoglobin: 9.6 g/dL — ABNORMAL LOW (ref 13.0–17.0)
MCH: 30 pg (ref 26.0–34.0)
MCHC: 31.1 g/dL (ref 30.0–36.0)
MCV: 96.6 fL (ref 78.0–100.0)
PLATELETS: 189 10*3/uL (ref 150–400)
RBC: 3.2 MIL/uL — ABNORMAL LOW (ref 4.22–5.81)
RDW: 20 % — AB (ref 11.5–15.5)
WBC: 4.7 10*3/uL (ref 4.0–10.5)

## 2015-06-17 LAB — BASIC METABOLIC PANEL
ANION GAP: 8 (ref 5–15)
BUN: 31 mg/dL — ABNORMAL HIGH (ref 6–20)
CO2: 30 mmol/L (ref 22–32)
CREATININE: 1.17 mg/dL (ref 0.61–1.24)
Calcium: 9.2 mg/dL (ref 8.9–10.3)
Chloride: 104 mmol/L (ref 101–111)
GFR calc non Af Amer: >60 mL/min (ref >60)
GLUCOSE: 98 mg/dL (ref 65–99)
POTASSIUM: 4.1 mmol/L (ref 3.5–5.1)
SODIUM: 142 mmol/L (ref 135–145)

## 2015-06-18 ENCOUNTER — Non-Acute Institutional Stay (SKILLED_NURSING_FACILITY): Payer: Medicare HMO | Admitting: Internal Medicine

## 2015-06-18 ENCOUNTER — Encounter: Payer: Self-pay | Admitting: Internal Medicine

## 2015-06-18 DIAGNOSIS — E875 Hyperkalemia: Secondary | ICD-10-CM

## 2015-06-18 DIAGNOSIS — R51 Headache: Secondary | ICD-10-CM

## 2015-06-18 DIAGNOSIS — D649 Anemia, unspecified: Secondary | ICD-10-CM

## 2015-06-18 DIAGNOSIS — R609 Edema, unspecified: Secondary | ICD-10-CM | POA: Insufficient documentation

## 2015-06-18 DIAGNOSIS — R519 Headache, unspecified: Secondary | ICD-10-CM

## 2015-06-18 NOTE — Progress Notes (Signed)
Patient ID: Miguel Mendoza, male   DOB: [birth year], 75 y.o.   MRN: [id number]                         This is an acute visit  FACILITY: [facility]                  LEVEL OF CARE:   SNF   CHIEF COMPLAINT Acute visit secondary to patient's concerns of increased edema-follow-up hyperkalemia-headaches      HISTORY OF PRESENT ILLNESS:  This is a 75 year-old man with quite a complex medical history-he recently was admitted  to the facility but readmitted to the hospital with severe hypernatremia. Marland Kitchen  He also had what was diagnosis supraventricular tachycardia-this was controlled on metoprolol 25 mg 3 times a day.  His hypernatremia resolved with IV Lasix --it has stabilized most recently 142 on lab done February 20    He actually was hypokalemic at one time a week supplemented this but he did rise up to 5.5 his potassiums been discontinued most recent potassium 4.1 on lab done again on February 20      Previously patient was admitted to the hospital with dyspnea.  He initially was placed on BiPAP.  On 04/12/2015, he was intubated due to progression of respiratory failure.  He was extubated on 04/23/2015.    He had a right greater than left pleural effusion.   He had a right-sided thoracentesis showing 270 mL of what is quoted as a parapneumonic exudative effusion.  He had been on antibiotics for 12 days.     the cause of his respiratory failure was felt to be COPD with pneumonia.  He has made marked improvement during his stay here-actually at one point had been on on hospice but this was discontinued secondary to him doing considerably better.    We have been monitoring his hemoglobin in this appeared to be going down somewhat at one point early in his hospitalization apparently was around 12 this did come down once to 11.2 and on lab done initially was 9.5-this appears to have stabilized with most recent hemoglobin 9.6 on lab done February 20--B-12 level was  borderline low and he is being supplemented  Iron panel showed a total iron binding capacity of 280 which is low-normal iron was within normal range at 112 reticulocyte normal at 1.9 B12 was borderline low at 184 folate was within normal range   -so far his stool guaicas been negative 3 and it was negative when I tested it initially as well.   Be at times has complained of dizziness this is more so appears when he gets up in the morning with head movement and also when he lies in bed for a few moments this is very transitory apparently is persisting somewhat but has not gotten any worse again one would be suspicious of an inner ear issue here.  He also complains of occasional headaches there is no visual changes chest pain shortness of breath-he says this is transitory as well-.  Most acute issue today is he is noted some increased edema may states he had been on Lasix previously before his hospitalization.  I did do a fairly extensive chart review which confirms this E been on varying doses of 40 and 20 mg it appears most recently per review of office notes.  He did have a cardiac echo done back in October 2016 showed ejection fraction of 55-60%.  Also is known October 2016 his weight was around 200 pounds it is currently 171.6-I suspect these have fairly significant weight loss during his hospitalization secondary to numerous issues as noted above       He did have a history of urinary retention--and a Foley was reinserted secondary to significant discomfort lbladder distention- However he is now more mobile active doing considerably better-his Foley has been removed and apparently he is voiding well per nursing            .          PAST MEDICAL HISTORY/PROBLEM LIST:   Reviewed.      Acute hypoxic and hypercarbic respiratory failure, felt to be secondary to COPD; right greater than left pleural effusion; CHF; and pneumonia.  Extubated on 04/23/2015.    Exudative  pleural effusion, which was felt to be parapneumonic.    Community-acquired pneumonia.  He has completed antibiotics.    COPD with acute exacerbation.     Diabetes.  Started on a sliding scale due to ongoing steroids.    Acute on chronic diastolic heart failure.  A 2D-echo in October showed an EF of 55-60% with grade 1 diastolic dysfunction.    Hypernatremia.   D  History of coronary artery disease.  Not felt to be unstable, yet a mildly elevated troponin at 0.05.    Acute renal failure.  Discharged with a creatinine of 0.78. Most recent creatinine 1.05 on lab done today    Tachycardia.  Unclear if this was AFib or SVT.  Cardiology increased his metoprolol to 25 t.i.d.    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    PAST SURGICAL HISTORY: Include coronary artery  grafting-back surgery -- anterior cervical discectomy fusion.      CURRENT MEDICATIONS:  Medication list is reviewed.             Albuterol inhaler 2 puffs every 6 hours when necessary.    Nexium 40 mg daily.    Feeding supplement ensure 3 times a day.    Glucophage thousand milligrams twice a day.    Lopressor 25 mg 3 times a day.    Nitroglycerin when necessary.    Percocet 7.5-325 mg one tablet every 4 hours when necessary.    Oxygen 4-6 L continuous.    Klonopin 0.5 mg twice a day.             Social history patient until recently apparently did smoke cigarettes about half a pack a day.  Apparently no smokeless tobacco history-alcohol or illicit drug history.  Family history significant for heart attack in his father and mother-as well as brother.        REVIEW OF SYSTEMS:    Gen. patient ontinues to steadily improve   Skin does not complain of rashes or itching  t.  Head ears eyes nose mouth and throat no completes visual changes or sore throat.  Resp-does not complain of shortness breath or cough.  Cardiac does not complain of chest pain has  minimal lower extremity edema.  GI does not complain of abdominal pain nausea vomiting diarrhea or constipation currently.  GU  Does not complain of dysuria  He is now voiding Foley catheter has been removed-does have a history of urinary retention  Muscle skeletal continues to gain a significant amount of strength although he still quite weak does not complain of joint pain.  Neurologic does not complainof syncopal-type feelings.--However does complain of dizziness of short duration as  noted aboveand also headaches of short duration with no associated symptoms   Psych appears to be in good spirits does not appear depressed or anxious   Physical exam.  Temperature 97.5 pulse 75 respirations 18 blood pressure 121/57 weight is 171.6 this is up about a pound since February 1 he is eating much better  In general this is a frail elderly male who looks significantly improved and stronger over the past several weeks.  His skin is warm and dry.-  Eyes pupils appear reactive to light sclera and conjunctiva are clear.  Oropharynx clear mucous membranes moist.  Chest is clear to auscultation there is no labored breathing.  Heart is regular rate and rhythm without murmur gallop or rub he has one plus lower extremity edema.--Possibly a small amount of coccyx edema-his edema really does not extend to his his feet  Abdomen is soft nontender positive bowel sounds.     GU  Only catheter has been removed a do not appreciate suprapubic tenderness.  Muscle skeletal is able to move all extremities 4 has gained a significant amount of strength I do not note any deformities other than arthritic.  Neurologic is grossly intact to speech is clear he is bright alert and interactive no lateralizing findings-- Nerves appear to be intact   Psych as noted above--is alert and interactive.    Labs.  06/17/2015.  Sodium 142 potassium 4.1 BUN 31 creatinine 1.17.  WBC 4.7 hemoglobin 9.6 platelets  189   05/28/2014.  Sodium 140 potassium 4.5 BUN 28 creatinine 1.05 CO2 level XXXVI which is baseline.  Magnesium is 1.6 it was 1.5 it is slowly rising he is on magnesium supplementation.  WBC 5.2 hemoglobin 9.5 MCV is 95.  Assessment plan.  #1-anemia-his hemoglobin  Appears to have stabiliz-lab work indicated an Radiographer, therapeutic of chronic disease--B12 is being supplemented    Also update CBC next week  Per  previoushart review I do not see any previous colonoscopy or GI consult-this may have to be pursued but clinically he appears to be doing quite well  #2 failure to thrive-he is doing significantly better here sodium has stabilized he is eating and drinking better this appears to be a significant turnaround which is encouraging--   #3 history of urinary retention at this point appears resolved fully catheter is been removed  #4 history of hypokalemia - hyperkalemia--I was receiving potassium supplementation this is been discontinued potassium is normalized at 4.1   #5 history of edema-per extensive chart review it appears he does have a listed history of grade 1 diastolic CHF with a cardiac echo showing ejection fraction actually of 55-60% last year.  He has been on Lasix before it appears we will start him on low-dose Lasix 20 mg a day and potassium 10 mEq a day and recheck a metabolic panel on February 23.  He has gained weight although I think this could be at least partially appetite related he is still well under the weight he was back in October which was 200 pounds.  Most recently is 171.6.  #6-history of headache-this appears somewhat transitory neurologically he is intact vital signs remained stable this persists certainly will have to readdress he is apparently going to be discharged fairly soon and I suspect this will need followed by his primary care provider-clinically he appears to be neurologically doing well however he still has occasional dizziness this appears to be  more positional related suspect possibly an inner ear issue apparently this is quite transitory however  this will warrant follow up  as well.       .       [id number] note greater than 35 minute minutes spent assessing patient-discussing his concerns at bedside -eviewing his chart-and coordinating formulating a plan of care for numerous diagnoses-of note greater than 50% of time spent coordinating plan of care for numerous issues

## 2015-06-19 ENCOUNTER — Encounter: Payer: Self-pay | Admitting: Internal Medicine

## 2015-06-19 ENCOUNTER — Non-Acute Institutional Stay (SKILLED_NURSING_FACILITY): Payer: Medicare HMO | Admitting: Internal Medicine

## 2015-06-19 DIAGNOSIS — R609 Edema, unspecified: Secondary | ICD-10-CM | POA: Diagnosis not present

## 2015-06-19 DIAGNOSIS — I5033 Acute on chronic diastolic (congestive) heart failure: Secondary | ICD-10-CM

## 2015-06-19 DIAGNOSIS — J9600 Acute respiratory failure, unspecified whether with hypoxia or hypercapnia: Secondary | ICD-10-CM | POA: Diagnosis not present

## 2015-06-19 DIAGNOSIS — R339 Retention of urine, unspecified: Secondary | ICD-10-CM

## 2015-06-19 NOTE — Progress Notes (Signed)
Patient ID: Miguel Mendoza, male   DOB: [birth year], 75 y.o.   MRN: [id number]                          This is a discharge note  FACILITY: [facility]                  LEVEL OF CARE:   SNF   CHIEF COMPLAINT Discharge note      HISTORY OF PRESENT ILLNESS:  This is a 75 year-old man with quite a complex medical history-he recently was admitted  to the facility but readmitted to the hospital with severe hypernatremia. Marland Kitchen  He also had what was diagnosis supraventricular tachycardia-this was controlled on metoprolol 25 mg 3 times a day.  His hypernatremia resolved with IV Lasix --it has stabilized most recently 142 on lab done February 20    He actually was hypokalemic at one time a week supplemented this but he did rise up to 5.5 his potassiums been discontinued most recent potassium 4.1 on lab done again on February 20      Previously patient was admitted to the hospital with dyspnea.  He initially was placed on BiPAP.  On 04/12/2015, he was intubated due to progression of respiratory failure.  He was extubated on 04/23/2015.    He had a right greater than left pleural effusion.   He had a right-sided thoracentesis showing 270 mL of what is quoted as a parapneumonic exudative effusion.  He had been on antibiotics for 12 days.     the cause of his respiratory failure was felt to be COPD with pneumonia.  He has made marked improvement during his stay here-actually at one point had been on on hospice but this was discontinued secondary to him doing considerably better.    We have been monitoring his hemoglobin in this appeared to be going down somewhat at one point early in his hospitalization apparently was around 12 this did come down once to 11.2 and on lab done initially was 9.5-this appears to have stabilized with most recent hemoglobin 9.6 on lab done February 20--B-12 level was borderline low and he is being supplemented  Iron panel showed a total iron  binding capacity of 280 which is low-normal iron was within normal range at 112 reticulocyte normal at 1.9 B12 was borderline low at 184 folate was within normal range   -so far his stool guaicas been negative 3 and it was negative when I tested it initially as well.   Be at times has complained of dizziness this is more so appears when he gets up in the morning with head movement and also when he lies in bed for a few moments this is very transitory apparently is persisting somewhat but has not gotten any worse again one would be suspicious of an inner ear issue here.  He also complains of occasional headaches there is no visual changes chest pain shortness of breath-he says this is transitory as well-.  Most acute issue today is he is noted some increased edema may states he had been on Lasix previously before his hospitalization.     He did have a cardiac echo done back in October 2016 showed ejection fraction of 55-60%.  Also is known October 2016 his weight was around 200 pounds it is currently 171.6-I suspect these have fairly significant weight loss during his hospitalization secondary to numerous issues as noted above  He has  been started on low-dose Lasix 20 mg a day he what he did updated metabolic panel next week at home health to draw.  Currently he has no acute complaints appears to have done very well during his rehabilitation here he will be going home with his wife who appears to be quite supportive he will need PT and OT for further strengthening                   .          PAST MEDICAL HISTORY/PROBLEM LIST:   Reviewed.      Acute hypoxic and hypercarbic respiratory failure, felt to be secondary to COPD; right greater than left pleural effusion; CHF; and pneumonia.  Extubated on 04/23/2015.    Exudative pleural effusion, which was felt to be parapneumonic.    Community-acquired pneumonia.  He has completed antibiotics.    COPD with acute  exacerbation.     Diabetes.  Started on a sliding scale due to ongoing steroids.    Acute on chronic diastolic heart failure.  A 2D-echo in October showed an EF of 55-60% with grade 1 diastolic dysfunction.    Hypernatremia.   D  History of coronary artery disease.  Not felt to be unstable, yet a mildly elevated troponin at 0.05.    Acute renal failure.  Discharged with a creatinine of 0.78. Most recent creatinine 1.05 on lab done today    Tachycardia.  Unclear if this was AFib or SVT.  Cardiology increased his metoprolol to 25 t.i.d.    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    PAST SURGICAL HISTORY: Include coronary artery  grafting-back surgery -- anterior cervical discectomy fusion.      CURRENT MEDICATIONS:  Medication list is reviewed.             Albuterol inhaler 2 puffs every 6 hours when necessary.    Nexium 40 mg daily.    Feeding supplement ensure 3 times a day.    Glucophage thousand milligrams twice a day.    Lopressor 25 mg 3 times a day.    Nitroglycerin when necessary.    Percocet 7.5-325 mg one tablet every 4 hours when necessary.   Lasix 20 mg by mouth daily.  Potassium 10 mEq daily.                                                            Klonopin 0.5 mg twice a day.             Social history patient until recently apparently did smoke cigarettes about half a pack a day.  Apparently no smokeless tobacco history-alcohol or illicit drug history.  Family history significant for heart attack in his father and mother-as well as brother.        REVIEW OF SYSTEMS:    Gen. patientcntinues to steadily improve   Skin does not complain of rashes or itching  t.  Head ears eyes nose mouth and throat no completes visual changes or sore throat.  Resp-does not complain of shortness breath or cough.  Cardiac does not complain of chest pain has minimal lower extremity edema.  GI does not complain of  abdominal pain nausea vomiting diarrhea or constipation currently.  GU  Does not complain of dysuria  He is now voiding Foley catheter has been removed-does have a history of urinary retention  Muscle skeletal continues to gain a significant amount of strength although he still quite weak does not complain of joint pain.  Neurologic does not complainof syncopal-type feelings.--However does complain of dizziness of short duration as noted aboveand also headaches of short duration with no associated symptoms   Psych appears to be in good spirits does not appear depressed or anxious   Physical exam.  Temperature 97.6 pulse 78 respirations 20 blood pressure 124/63  In general this is a frail elderly male who looks significantly improved and stronger over the past several weeks.  His skin is warm and dry.-  Eyes pupils appear reactive to light sclera and conjunctiva are clear.  Oropharynx clear mucous membranes moist.  Chest is clear to auscultation there is no labored breathing.  Heart is regular rate and rhythm without murmur gallop or rub he has one plus lower extremity edema.-  Abdomen is soft nontender positive bowel sounds.     GU  Only catheter has been removed a do not appreciate suprapubic tenderness.  Muscle skeletal is able to move all extremities 4 has gained a significant amount of strength I do not note any deformities other than arthritic.  Neurologic is grossly intact to speech is clear he is bright alert and interactive no lateralizing findings-- CN's  appear to be intact   Psych as noted above--is alert and interactive.    Labs.  06/17/2015.  Sodium 142 potassium 4.1 BUN 31 creatinine 1.17.  WBC 4.7 hemoglobin 9.6 platelets 189   05/28/2014.  Sodium 140 potassium 4.5 BUN 28 creatinine 1.05 CO2 level XXXVI which is baseline.  Magnesium is 1.6 it was 1.5 it is slowly rising he is on magnesium supplementation.  WBC 5.2 hemoglobin 9.5 MCV is  95.  Assessment plan.  #1-anemia-his hemoglobin  Appears to have stabiliz-lab work indicated an Radiographer, therapeutic of chronic disease--B12 is being supplemented    Also update CBC next week will have to be done by home health and primary care provider notified of results  Per  previoushart review I do not see any previous colonoscopy or GI consult-this may have to be pursued but clinically he appears to be doing quite well  #2 failure to thrive-with acute respiratory failure --he is doing significantly better here sodium has stabilized he is eating and drinking better this appears to be a significant turnaround which is encouraging--update metabolic panel next week as well--he no longer requires oxygen which is encouraging   #3 history of urinary retention at this point appears resolved fully catheter is been removed  #4 history of hypokalemia - hyperkalemia- He is back on low-dose potassium since Lasix is just been started as well have to be monitored updated metabolic panel next week primary care provider notified of results   #5 history of edema-per extensive chart review it appears he does have a listed history of grade 1 diastolic CHF with a cardiac echo showing ejection fraction actually of 55-60% last year.  He has been on Lasix before it appears we have started himim on low-dose Lasix 20 mg a day and potassium 10 mEq a day .  He has gained weight although I think this could be at least partially appetite related he is still well under the weight he was back in October which was 200 pounds.  Most recently is 171.6.  #6-history of headache-this appears somewhat transitory neurologically he is intact vital signs remained  stable this persists certainly will have to readdress he is apparently going to be discharged -- suspect this will need followed by his primary care provider-clinically he appears to be neurologically doing well however he still has occasional dizziness this appears to be more  positional related suspect possibly an inner ear issue apparently this is quite transitory however this will warrant follow up  as well.  #7 History of diabetes this appears to be diet-controlled--on Metformin   blood sugars run mainly in the high 80s to low 100s in the morning appears to run more in the lower 100s mid 100s and later date parts this appears to be under good control  [id number]       .

## 2015-06-20 ENCOUNTER — Other Ambulatory Visit: Payer: Self-pay

## 2015-06-20 ENCOUNTER — Encounter (HOSPITAL_COMMUNITY)
Admission: AD | Admit: 2015-06-20 | Discharge: 2015-06-20 | Disposition: A | Discharge status: Home / Self Care | Source: Skilled Nursing Facility | Attending: Internal Medicine | Admitting: Internal Medicine

## 2015-06-20 DIAGNOSIS — I10 Essential (primary) hypertension: Secondary | ICD-10-CM | POA: Diagnosis not present

## 2015-06-20 LAB — BASIC METABOLIC PANEL
ANION GAP: 10 (ref 5–15)
BUN: 33 mg/dL — ABNORMAL HIGH (ref 6–20)
CALCIUM: 9.8 mg/dL (ref 8.9–10.3)
CO2: 31 mmol/L (ref 22–32)
CREATININE: 1.24 mg/dL (ref 0.61–1.24)
Chloride: 101 mmol/L (ref 101–111)
GFR, EST NON AFRICAN AMERICAN: 56 mL/min — AB (ref >60)
Glucose, Bld: 125 mg/dL — ABNORMAL HIGH (ref 65–99)
Potassium: 4.1 mmol/L (ref 3.5–5.1)
SODIUM: 142 mmol/L (ref 135–145)

## 2015-06-20 LAB — MAGNESIUM: MAGNESIUM: 1.9 mg/dL (ref 1.7–2.4)

## 2015-06-20 MED ORDER — OXYCODONE-ACETAMINOPHEN 7.5-325 MG PO TABS: 1.0000 | ORAL_TABLET | ORAL | Status: AC | PRN

## 2015-06-20 NOTE — Telephone Encounter (Signed)
RX faxed to [facility] @ [phone]. Phone number [phone]  

## 2015-06-22 ENCOUNTER — Encounter (HOSPITAL_COMMUNITY): Payer: Self-pay | Admitting: *Deleted

## 2015-06-22 ENCOUNTER — Emergency Department (HOSPITAL_COMMUNITY)

## 2015-06-22 ENCOUNTER — Emergency Department (HOSPITAL_COMMUNITY)
Admission: EM | Admit: 2015-06-22 | Discharge: 2015-06-22 | Disposition: A | Discharge status: Home / Self Care | Attending: Emergency Medicine | Admitting: Emergency Medicine

## 2015-06-22 DIAGNOSIS — Y9389 Activity, other specified: Secondary | ICD-10-CM | POA: Insufficient documentation

## 2015-06-22 DIAGNOSIS — W1839XA Other fall on same level, initial encounter: Secondary | ICD-10-CM | POA: Insufficient documentation

## 2015-06-22 DIAGNOSIS — Z8709 Personal history of other diseases of the respiratory system: Secondary | ICD-10-CM | POA: Diagnosis not present

## 2015-06-22 DIAGNOSIS — S79911A Unspecified injury of right hip, initial encounter: Secondary | ICD-10-CM | POA: Insufficient documentation

## 2015-06-22 DIAGNOSIS — S62525A Nondisplaced fracture of distal phalanx of left thumb, initial encounter for closed fracture: Secondary | ICD-10-CM | POA: Diagnosis not present

## 2015-06-22 DIAGNOSIS — Y92009 Unspecified place in unspecified non-institutional (private) residence as the place of occurrence of the external cause: Secondary | ICD-10-CM | POA: Diagnosis not present

## 2015-06-22 DIAGNOSIS — K219 Gastro-esophageal reflux disease without esophagitis: Secondary | ICD-10-CM | POA: Insufficient documentation

## 2015-06-22 DIAGNOSIS — Z8673 Personal history of transient ischemic attack (TIA), and cerebral infarction without residual deficits: Secondary | ICD-10-CM | POA: Insufficient documentation

## 2015-06-22 DIAGNOSIS — Z79899 Other long term (current) drug therapy: Secondary | ICD-10-CM | POA: Insufficient documentation

## 2015-06-22 DIAGNOSIS — Z8739 Personal history of other diseases of the musculoskeletal system and connective tissue: Secondary | ICD-10-CM | POA: Insufficient documentation

## 2015-06-22 DIAGNOSIS — Z951 Presence of aortocoronary bypass graft: Secondary | ICD-10-CM | POA: Insufficient documentation

## 2015-06-22 DIAGNOSIS — E119 Type 2 diabetes mellitus without complications: Secondary | ICD-10-CM | POA: Insufficient documentation

## 2015-06-22 DIAGNOSIS — I2510 Atherosclerotic heart disease of native coronary artery without angina pectoris: Secondary | ICD-10-CM | POA: Insufficient documentation

## 2015-06-22 DIAGNOSIS — S60012A Contusion of left thumb without damage to nail, initial encounter: Secondary | ICD-10-CM | POA: Insufficient documentation

## 2015-06-22 DIAGNOSIS — Z7984 Long term (current) use of oral hypoglycemic drugs: Secondary | ICD-10-CM | POA: Diagnosis not present

## 2015-06-22 DIAGNOSIS — F17210 Nicotine dependence, cigarettes, uncomplicated: Secondary | ICD-10-CM | POA: Insufficient documentation

## 2015-06-22 DIAGNOSIS — I252 Old myocardial infarction: Secondary | ICD-10-CM | POA: Insufficient documentation

## 2015-06-22 DIAGNOSIS — I959 Hypotension, unspecified: Secondary | ICD-10-CM | POA: Diagnosis not present

## 2015-06-22 DIAGNOSIS — S6992XA Unspecified injury of left wrist, hand and finger(s), initial encounter: Secondary | ICD-10-CM | POA: Diagnosis present

## 2015-06-22 DIAGNOSIS — F419 Anxiety disorder, unspecified: Secondary | ICD-10-CM | POA: Diagnosis not present

## 2015-06-22 DIAGNOSIS — S62502A Fracture of unspecified phalanx of left thumb, initial encounter for closed fracture: Secondary | ICD-10-CM

## 2015-06-22 DIAGNOSIS — W19XXXA Unspecified fall, initial encounter: Secondary | ICD-10-CM

## 2015-06-22 DIAGNOSIS — Y998 Other external cause status: Secondary | ICD-10-CM | POA: Diagnosis not present

## 2015-06-22 HISTORY — DX: Respiratory failure, unspecified, unspecified whether with hypoxia or hypercapnia: J96.90

## 2015-06-22 LAB — BASIC METABOLIC PANEL
Anion gap: 9 (ref 5–15)
BUN: 33 mg/dL — AB (ref 6–20)
CALCIUM: 8.9 mg/dL (ref 8.9–10.3)
CO2: 27 mmol/L (ref 22–32)
CREATININE: 1.61 mg/dL — AB (ref 0.61–1.24)
Chloride: 107 mmol/L (ref 101–111)
GFR calc Af Amer: 47 mL/min — ABNORMAL LOW (ref >60)
GFR, EST NON AFRICAN AMERICAN: 41 mL/min — AB (ref >60)
GLUCOSE: 100 mg/dL — AB (ref 65–99)
Potassium: 4.1 mmol/L (ref 3.5–5.1)
SODIUM: 143 mmol/L (ref 135–145)

## 2015-06-22 LAB — BRAIN NATRIURETIC PEPTIDE: B NATRIURETIC PEPTIDE 5: 91 pg/mL (ref 0.0–100.0)

## 2015-06-22 LAB — CBC WITH DIFFERENTIAL/PLATELET
BASOS ABS: 0 10*3/uL (ref 0.0–0.1)
Basophils Relative: 1 %
EOS ABS: 0.3 10*3/uL (ref 0.0–0.7)
EOS PCT: 5 %
HCT: 30.9 % — ABNORMAL LOW (ref 39.0–52.0)
Hemoglobin: 9.5 g/dL — ABNORMAL LOW (ref 13.0–17.0)
LYMPHS PCT: 35 %
Lymphs Abs: 2 10*3/uL (ref 0.7–4.0)
MCH: 29.9 pg (ref 26.0–34.0)
MCHC: 30.7 g/dL (ref 30.0–36.0)
MCV: 97.2 fL (ref 78.0–100.0)
MONO ABS: 0.4 10*3/uL (ref 0.1–1.0)
Monocytes Relative: 7 %
Neutro Abs: 3.1 10*3/uL (ref 1.7–7.7)
Neutrophils Relative %: 52 %
PLATELETS: 213 10*3/uL (ref 150–400)
RBC: 3.18 MIL/uL — ABNORMAL LOW (ref 4.22–5.81)
RDW: 19.8 % — AB (ref 11.5–15.5)
WBC: 5.8 10*3/uL (ref 4.0–10.5)

## 2015-06-22 MED ORDER — SODIUM CHLORIDE 0.9 % IV BOLUS (SEPSIS)
1000.0000 mL | Freq: Once | INTRAVENOUS | Status: AC
  Administered 2015-06-22: 1000 mL via INTRAVENOUS

## 2015-06-22 NOTE — ED Notes (Addendum)
Pt fell at home at home yesterday. Pain to left thumb and right hip. Unable to ambulate well since Thursday. [facility]GB en route 91.

## 2015-06-22 NOTE — ED Provider Notes (Signed)
CSN: [id number]     Arrival date & time 06/22/15  1354 History   First MD Initiated Contact with Patient 06/22/15 1408     Chief Complaint  Patient presents with  . Fall     (Consider location/radiation/quality/duration/timing/severity/associated sxs/prior Treatment) HPI Patient presents after a fall but heard yesterday, with concern of pain in multiple areas, as well as generalized weakness. Patient has a notable history of hospitalization 2 months ago, with prolonged rehabilitation care stay. He notes that he arrived home 2 days ago. He continues to complain of generalized weakness, gait difficulty due to the weakness. Yesterday, patient had a mechanical fall, recalls the entirety of the event. Since the event he has not been ambulatory, complains of pain in the right hip, left thumb. Currently he denies confusion, disorientation, neck stiffness, weakness in any specific body parts. Family as her here with the patient, they corroborate, that since discharge from his rehabilitation facility, the patient has been persistently weak. History the patient is interacting in a typical manner. Patient is scheduled to have home physical therapy in 2 days.   Past Medical History  Diagnosis Date  . CAD (coronary artery disease)     Multivessel status post stent to LAD 1998 then CABG 2003  . Myocardial infarction (HCC)   . History of stroke 31  . Type 2 diabetes mellitus (HCC)   . Essential hypertension   . Dyslipidemia   . Migraine headache   . GERD (gastroesophageal reflux disease)   . Anxiety   . Cervical disc disease   . Respiratory failure [facility])    Past Surgical History  Procedure Laterality Date  . Coronary artery bypass graft  2003    Dr. Cornelius Moras - LIMA to LAD, SVG to OM, SVG to RCA  . Carotid endarterectomy Left   . Back surgery    . Anterior cervical decomp/discectomy fusion     Family History  Problem Relation Age of Onset  . Heart attack Father   . Heart attack Mother    . Heart attack Brother    Social History  Substance Use Topics  . Smoking status: Current Every Day Smoker -- 0.50 packs/day    Types: Cigarettes  . Smokeless tobacco: None  . Alcohol Use: No    Review of Systems  Constitutional:       Per HPI, otherwise negative  HENT:       Per HPI, otherwise negative  Respiratory:       Per HPI, otherwise negative  Cardiovascular:       Per HPI, otherwise negative  Gastrointestinal: Negative for vomiting.  Endocrine:       Negative aside from HPI  Genitourinary:       Neg aside from HPI   Musculoskeletal:       Per HPI, otherwise negative  Skin: Negative.   Neurological: Positive for weakness. Negative for syncope.      Allergies  Ibuprofen  Home Medications   Prior to Admission medications   Medication Sig Start Date End Date Taking? Authorizing Provider  clonazePAM (KLONOPIN) 1 MG tablet Take one tablet by mouth twice daily Patient taking differently: Take 1 mg by mouth 2 (two) times daily. Take one tablet by mouth twice daily 04/30/15  Yes Sharon Seller, NP  esomeprazole (NEXIUM) 40 MG capsule Take 40 mg by mouth daily before breakfast.   Yes Historical Provider, MD  furosemide (LASIX) 20 MG tablet Take 20 mg by mouth.   Yes Historical Provider, MD  metFORMIN (GLUCOPHAGE) 500 MG tablet Take 1,000 mg by mouth 2 (two) times daily with a meal.   Yes Historical Provider, MD  metoprolol tartrate (LOPRESSOR) 25 MG tablet Take 1 tablet (25 mg total) by mouth 3 (three) times daily. 04/26/15  Yes Henderson Cloud, MD  nystatin-triamcinolone [facility]) cream Apply topically 4 (four) times daily. 04/26/15  Yes Henderson Cloud, MD  ondansetron (ZOFRAN) 4 MG tablet Take 4 mg by mouth every 4 (four) hours as needed. 05/31/15  Yes Historical Provider, MD  oxyCODONE-acetaminophen (PERCOCET) 7.5-325 MG tablet Take 1 tablet by mouth every 4 (four) hours as needed for moderate pain. Max APAP 3gm/24 hrs from all sources 06/20/15   Yes Tiffany L Reed, DO  potassium chloride (K-DUR,KLOR-CON) 10 MEQ tablet Take 10 mEq by mouth 2 (two) times daily.   Yes Historical Provider, MD  albuterol (PROVENTIL HFA;VENTOLIN HFA) 108 (90 Base) MCG/ACT inhaler Inhale 2 puffs into the lungs every 6 (six) hours as needed for wheezing or shortness of breath.    Historical Provider, MD  feeding supplement, ENSURE ENLIVE, (ENSURE ENLIVE) LIQD Take 237 mLs by mouth 2 (two) times daily between meals. 04/28/15   Henderson Cloud, MD  nitroGLYCERIN (NITROSTAT) 0.4 MG SL tablet Place 0.4 mg under the tongue every 5 (five) minutes as needed.    Historical Provider, MD   BP 110/59 mmHg  Pulse 73  Temp(Src) 98.1 F (36.7 C) (Oral)  Resp 18  Ht  (1.651 m)  Wt 165 lb (74.844 kg)  BMI 27.46 kg/m2  SpO2 92% Physical Exam  Constitutional: He is oriented to person, place, and time. He appears well-developed. No distress.  HENT:  Head: Normocephalic and atraumatic.  Eyes: Conjunctivae and EOM are normal.  Neck: Neck supple.  Cardiovascular: Normal rate and regular rhythm.   Pulmonary/Chest: Effort normal. No stridor. No respiratory distress.  Abdominal: He exhibits no distension.  Musculoskeletal: He exhibits no edema.       Arms:      Legs: Neurological: He is alert and oriented to person, place, and time.  Skin: Skin is warm and dry.  Psychiatric: He has a normal mood and affect.  Nursing note and vitals reviewed.   ED Course  Procedures (including critical care time) Labs Review Labs Reviewed  BASIC METABOLIC PANEL - Abnormal; Notable for the following:    Glucose, Bld 100 (*)    BUN 33 (*)    Creatinine, Ser 1.61 (*)    GFR calc non Af Amer 41 (*)    GFR calc Af Amer 47 (*)    All other components within normal limits  CBC WITH DIFFERENTIAL/PLATELET - Abnormal; Notable for the following:    RBC 3.18 (*)    Hemoglobin 9.5 (*)    HCT 30.9 (*)    RDW 19.8 (*)    All other components within normal limits  BRAIN  NATRIURETIC PEPTIDE    Imaging Review Dg Chest 2 View  06/22/2015  CLINICAL DATA:  Weakness, fall EXAM: CHEST  2 VIEW COMPARISON:  04/29/2015 FINDINGS: Prior CABG. There is cardiomegaly. No confluent airspace opacity within the lungs. No effusions. No acute bony abnormality. IMPRESSION: Cardiomegaly.  No active disease. Electronically Signed   By: Charlett Nose M.D.   On: 06/22/2015 15:59   Dg Finger Thumb Left  06/22/2015  CLINICAL DATA:  Patient status post fall onto flat surface. Thumb pain. Initial encounter. EXAM: LEFT THUMB 2+V COMPARISON:  None. FINDINGS: There is a  nondisplaced oblique fracture through the mid aspect of the distal phalanx of the thumb. No evidence for associated acute fractures. [facility] joint degenerative changes. Regional soft tissues are unremarkable. IMPRESSION: Nondisplaced oblique fracture through the distal phalanx of the thumb. Electronically Signed   By: Annia Belt M.D.   On: 06/22/2015 16:16   Dg Hip Unilat With Pelvis 2-3 Views Right  06/22/2015  CLINICAL DATA:  Fall yesterday on flat surface, right hip pain EXAM: DG HIP (WITH OR WITHOUT PELVIS) 2-3V RIGHT COMPARISON:  None. FINDINGS: Mild symmetric degenerative changes in the hips with joint space narrowing and spurring. Bilateral injection granulomas noted. No acute bony abnormality. Specifically, no fracture, subluxation, or dislocation. Soft tissues are intact. Vascular calcifications present. IMPRESSION: No acute bony abnormality. Electronically Signed   By: Charlett Nose M.D.   On: 06/22/2015 16:11   I have personally reviewed and evaluated these images and lab results as part of my medical decision-making.   After the initial fluid bolus, the patient continued to have mild persistent hypotension.  Update:, Now following 2 L resuscitation, the patient's blood pressure is normal. Patient is sitting upright, has been eating, drinking, is in no distress. We discussed all findings again at length, and the  need to follow-up with orthopedics, and his physical therapist, at home in 2 days.  MDM   Final diagnoses:  Fall, initial encounter  Hypotension, unspecified hypotension type  Fracture of thumb, left, closed, initial encounter   Patient presents with ongoing weakness, and after fall occurred yesterday. Here, the patient is awake, alert, mentating well. Patient's evaluation is notable for demonstration of left thumb fracture. No evidence for hip fractures.  Patient is found to be persistently hypotensive, with slight elevation in creatinine, likely dehydrated. Patient improved substantially here after fluid resuscitation. Patient had no evidence for pulmonary congestion, and his BNP was normal. He is appropriate for discharge with outpatient follow-up.   Gerhard Munch, MD 06/22/15 1806

## 2015-06-22 NOTE — ED Notes (Signed)
Patient assisted to bedside commode, tolerated well.

## 2015-06-22 NOTE — ED Notes (Signed)
Patient's assisted to bathroom, gait unsteady. EDP made aware.

## 2015-06-22 NOTE — ED Notes (Signed)
EDP made aware 2nd liter of fluid complete. With blood pressure of 110/59.

## 2015-06-22 NOTE — Discharge Instructions (Signed)
As discussed, today's evaluation was notable for demonstrating the presence of a thumb fracture.  Is important that he follow up with our orthopedist in one week.  In addition, please stay well hydrated, and be sure to follow-up with her primary care physician to insure that your recovery is going well.  Return here for concerning changes in your condition.

## 2015-09-03 ENCOUNTER — Ambulatory Visit (INDEPENDENT_AMBULATORY_CARE_PROVIDER_SITE_OTHER): Payer: Medicare HMO | Admitting: Cardiology

## 2015-09-03 ENCOUNTER — Other Ambulatory Visit: Payer: Self-pay | Admitting: *Deleted

## 2015-09-03 ENCOUNTER — Encounter: Payer: Self-pay | Admitting: Cardiology

## 2015-09-03 VITALS — BP 148/82 | HR 78 | Ht 65.0 in | Wt 177.0 lb

## 2015-09-03 DIAGNOSIS — I2510 Atherosclerotic heart disease of native coronary artery without angina pectoris: Secondary | ICD-10-CM | POA: Diagnosis not present

## 2015-09-03 DIAGNOSIS — I5033 Acute on chronic diastolic (congestive) heart failure: Secondary | ICD-10-CM | POA: Diagnosis not present

## 2015-09-03 DIAGNOSIS — E785 Hyperlipidemia, unspecified: Secondary | ICD-10-CM | POA: Diagnosis not present

## 2015-09-03 DIAGNOSIS — I5032 Chronic diastolic (congestive) heart failure: Secondary | ICD-10-CM

## 2015-09-03 DIAGNOSIS — I10 Essential (primary) hypertension: Secondary | ICD-10-CM | POA: Diagnosis not present

## 2015-09-03 MED ORDER — FUROSEMIDE 40 MG PO TABS: 60.0000 mg | ORAL_TABLET | Freq: Every day | ORAL | Status: DC

## 2015-09-03 NOTE — Progress Notes (Signed)
Cardiology Office Note  Date: 09/03/2015   ID: Miguel Mendoza, DOB 01/07/1941, MRN [id number]  PCP: Monico Blitz, MD  Primary Cardiologist: Rozann Lesches, MD   Chief Complaint  Patient presents with  . Coronary Artery Disease    History of Present Illness: Miguel Mendoza is a 75 y.o. male that I met for the first time in October 2016, history reviewed in the prior note. I reviewed interval records. He was hospitalized in January of this year with hypoxemic and hypercarbic respiratory failure requiring mechanical ventilatory support in the setting of COPD and pleural effusions. He had progressive mental decline and general failure to thrive. He was seen by the Palliative care team, discharged to SNF with plan for comfort care, DNR/DNI status.  He has apparently made a fairly significant recovery, is now living back at home with his wife, much more mentally clear in general. He reports good appetite, his weight has been gradually increasing. He has arthritic leg pain and uses a walker.  I reviewed his medications today. She continues on Lasix at 40 mg daily. Weight compared to February is up 12 pounds. He does have leg edema evident.  I reviewed his echocardiogram from December 2016, LVEF 55-60% with grade 1 diastolic dysfunction.  Past Medical History  Diagnosis Date  . CAD (coronary artery disease)     Multivessel status post stent to LAD 1998 then CABG 2003  . Myocardial infarction ([place])   . History of stroke 29  . Type 2 diabetes mellitus ([place])   . Essential hypertension   . Dyslipidemia   . Migraine headache   . GERD (gastroesophageal reflux disease)   . Anxiety   . Cervical disc disease   . Respiratory failure [facility])     Past Surgical History  Procedure Laterality Date  . Coronary artery bypass graft  2003    Dr. Roxy Manns - LIMA to LAD, SVG to OM, SVG to RCA  . Carotid endarterectomy Left   . Back surgery    . Anterior cervical decomp/discectomy fusion       Current Outpatient Prescriptions  Medication Sig Dispense Refill  . albuterol (PROVENTIL HFA;VENTOLIN HFA) 108 (90 Base) MCG/ACT inhaler Inhale 2 puffs into the lungs every 6 (six) hours as needed for wheezing or shortness of breath.    . clonazePAM (KLONOPIN) 1 MG tablet Take 1 mg by mouth daily.    . Cyanocobalamin (VITAMIN B-12 IJ) Inject as directed once a week.    . esomeprazole (NEXIUM) 40 MG capsule Take 40 mg by mouth daily before breakfast.    . furosemide (LASIX) 40 MG tablet Take 1.5 tablets (60 mg total) by mouth daily. 60 tablet 3  . gabapentin (NEURONTIN) 400 MG capsule Take 400 mg by mouth 3 (three) times daily.    . metFORMIN (GLUCOPHAGE) 500 MG tablet Take 1,000 mg by mouth 2 (two) times daily with a meal.    . nitroGLYCERIN (NITROSTAT) 0.4 MG SL tablet Place 0.4 mg under the tongue every 5 (five) minutes as needed.    . nystatin-triamcinolone (MYCOLOG II) cream Apply topically 4 (four) times daily. 30 g 0  . oxyCODONE-acetaminophen (PERCOCET) 7.5-325 MG tablet Take 1 tablet by mouth every 4 (four) hours as needed for moderate pain. Max APAP 3gm/24 hrs from all sources 180 tablet 0  . potassium chloride (K-DUR,KLOR-CON) 10 MEQ tablet Take 10 mEq by mouth 2 (two) times daily.    Marland Kitchen tiZANidine (ZANAFLEX) 4 MG capsule Take 4 mg by mouth 3 (  three) times daily.    . traZODone (DESYREL) 100 MG tablet Take 100 mg by mouth at bedtime.     No current facility-administered medications for this visit.   Allergies:  Ibuprofen   Social History: The patient  reports that he quit smoking about 3 weeks ago. His smoking use included Cigarettes. He smoked 0.50 packs per day. He has never used smokeless tobacco. He reports that he does not drink alcohol or use illicit drugs.   ROS:  Please see the history of present illness. Otherwise, complete review of systems is positive for arthritic leg pain.  All other systems are reviewed and negative.   Physical Exam: VS:  BP 148/82 mmHg  Pulse  78  Ht 5' 5"  (1.651 m)  Wt 177 lb (80.287 kg)  BMI 29.45 kg/m2  SpO2 93%, BMI Body mass index is 29.45 kg/(m^2).  Wt Readings from Last 3 Encounters:  09/03/15 177 lb (80.287 kg)  06/22/15 165 lb (74.844 kg)  05/02/15 164 lb 7.4 oz (74.6 kg)    General: Overweight, chronically ill-appearing male, no distress. HEENT: Conjunctiva and lids normal, oropharynx clear. Neck: Supple, no elevated JVP or carotid bruits, no thyromegaly. Lungs: Diminished breath sounds at the bases, nonlabored breathing at rest. Cardiac: Regular rate and rhythm, no S3, 2/6 systolic murmur, no pericardial rub. Abdomen: Protuberant, nontender, bowel sounds present, no guarding or rebound. Extremities: 1-2+ leg edema, distal pulses 2+. Skin: Warm and dry. Musculoskeletal: No kyphosis. Neuropsychiatric: Alert and oriented x3, affect grossly appropriate.  ECG: I personally reviewed the prior tracing from 04/29/2015 which showed sinus tachycardia with incomplete right bundle branch block and nonspecific ST changes, PACs.  Recent Labwork: 04/11/2015: TSH 1.082 05/15/2015: ALT 13*; AST 17 06/20/2015: Magnesium 1.9 06/22/2015: B Natriuretic Peptide 91.0; BUN 33*; Creatinine, Ser 1.61*; Hemoglobin 9.5*; Platelets 213; Potassium 4.1; Sodium 143     Component Value Date/Time   TRIG 130 04/21/2015 0842    Other Studies Reviewed Today:  Carlton Adam Cardiolite 11/14/2012 [facility]): Nondiagnostic ST segment changes, moderate sized septal scar as well as a region of scar within the inferior wall associated with minimal peri-infarct ischemia, LVEF 56%.  Echocardiogram 02/08/2015 [facility]): Mild LVH with LVEF 55-60%, mild to moderate left atrial enlargement, mild to moderate right atrial enlargement, sclerotic aortic valve without stenosis, suggestion of increased RV pressure and volume however RVSP was not able to be calculated.   Echocardiogram 04/12/2015: Study Conclusions  - Left ventricle: The cavity size was normal.  Systolic function was  normal. The estimated ejection fraction was in the range of 55%  to 60%. Wall motion was normal; there were no regional wall  motion abnormalities. Doppler parameters are consistent with  abnormal left ventricular relaxation (grade 1 diastolic  dysfunction). Mild basal septal hypertrophy. - Ventricular septum: Septal motion showed abnormal function and  dyssynergy. These changes are consistent with intraventricular  conduction delay. - Aortic valve: Moderately to severely calcified annulus.  Trileaflet; mildly thickened, mildly calcified leaflets. There  was mild stenosis. Peak velocity (S): 247 cm/s. Mean gradient  (S): 12 mm Hg. Valve area (Vmean): 1.45 cm^2. - Mitral valve: Calcified annulus. Mildly thickened leaflets . - Left atrium: The atrium was mildly to moderately dilated. - Right ventricle: The cavity size was moderately dilated. Systolic  function was moderately reduced. - Tricuspid valve: There was mild regurgitation. - Systemic veins: Unable to accurately assess respiratory variation  as patient is ventilated.  Impressions:  - When compared to the study report dated 02/08/15, there have been  no significant changes.  Assessment and Plan:  1. Acute on chronic diastolic heart failure. Increase Lasix to 60 mg daily, continue KCl 20 mEq daily. Follow-up BMET in 2 weeks.  2. Multivessel CAD status post CABG in 2003. No active angina symptoms. Cardiolite study from 2014 showed inferior and septal scar with minimal peri-infarct ischemia.  3. Essential hypertension, blood pressure mildly elevated today. No change in current regimen.  4. Hyperlipidemia, no longer on Lipitor. Plan to readdress lipid status with further follow-up.  Current medicines were reviewed with the patient today.   Orders Placed This Encounter  Procedures  . Basic metabolic panel    Disposition: FU with me in 3 months.   Signed, Satira Sark, MD,  [facility] 09/03/2015 9:10 AM    [facility] [place], [place], [place] [address] Phone: [phone]; Fax: [phone]

## 2015-09-03 NOTE — Patient Instructions (Signed)
Your physician has recommended you make the following change in your medication:  Increase furosemide 40 mg to 1&1/2 tablets daily for a total of 60 mg daily. Continue all other medications the same. Your physician recommends that you have lab work in 2 weeks around 09/17/15 to check your BMET. Your physician recommends that you schedule a follow-up appointment in: 3 months.

## 2015-09-18 ENCOUNTER — Telehealth: Payer: Self-pay | Admitting: *Deleted

## 2015-09-18 NOTE — Telephone Encounter (Signed)
-----   Message from Jonelle SidleSamuel G McDowell, MD sent at 09/17/2015 11:30 AM EDT ----- Reviewed. Potassium 4.7 and creatinine 1.2. Lasix was increased at the last visit. Continue current regimen.

## 2015-09-24 NOTE — Telephone Encounter (Signed)
Patient informed. 

## 2015-11-27 NOTE — Progress Notes (Signed)
Cardiology Office Note  Date: 12/09/2015   ID: Miguel Mendoza, DOB Apr 16, 1941, MRN [id number]  PCP: [name], MD  Primary Cardiologist: Nona Dell, MD   Chief Complaint  Patient presents with  . Coronary Artery Disease    History of Present Illness: Miguel Mendoza is a 75 y.o. male last seen in May. He is here today with his wife for a follow-up visit. Still doing remarkably well despite prior critical illness. History includes hypoxemic and hypercarbic respiratory failure requiring mechanical ventilatory support in the setting of COPD and pleural effusions back in January. He had progressive mental decline and general failure to thrive. He was seen by the Palliative care team, discharged to SNF with plan for comfort care, DNR/DNI status. He made a significant improvement and is now living at home with his wife.  At the last visit we increased Lasix to 60 mg daily. Follow-up lab work in May is outlined below. Reports generally improved leg and ankle edema, although not resolved.  He uses a cane to ambulate, denies any recent falls. Not reporting any angina symptoms at this time. No nitroglycerin use. I reviewed his cardiac medications as outlined below.  Most recent echocardiogram from December 2016 showed normal LVEF with mild diastolic dysfunction.  Past Medical History:  Diagnosis Date  . Anxiety   . CAD (coronary artery disease)    Multivessel status post stent to LAD 1998 then CABG 2003  . Cervical disc disease   . Dyslipidemia   . Essential hypertension   . GERD (gastroesophageal reflux disease)   . History of stroke 57  . Migraine headache   . Myocardial infarction (HCC)   . Respiratory failure (HCC)   . Type 2 diabetes mellitus (HCC)     Past Surgical History:  Procedure Laterality Date  . ANTERIOR CERVICAL DECOMP/DISCECTOMY FUSION    . BACK SURGERY    . CAROTID ENDARTERECTOMY Left   . CORONARY ARTERY BYPASS GRAFT  2003   Dr. Cornelius Moras - LIMA to LAD,  SVG to OM, SVG to RCA    Current Outpatient Prescriptions  Medication Sig Dispense Refill  . albuterol (PROVENTIL HFA;VENTOLIN HFA) 108 (90 Base) MCG/ACT inhaler Inhale 2 puffs into the lungs every 6 (six) hours as needed for wheezing or shortness of breath.    . clonazePAM (KLONOPIN) 1 MG tablet Take 1 mg by mouth daily.    . Cyanocobalamin (VITAMIN B-12 IJ) Inject as directed once a week.    . esomeprazole (NEXIUM) 40 MG capsule Take 40 mg by mouth daily before breakfast.    . furosemide (LASIX) 40 MG tablet Take 1.5 tablets (60 mg total) by mouth daily. 60 tablet 3  . gabapentin (NEURONTIN) 400 MG capsule Take 400 mg by mouth 3 (three) times daily.    . metFORMIN (GLUCOPHAGE) 500 MG tablet Take 1,000 mg by mouth 2 (two) times daily with a meal.    . nitroGLYCERIN (NITROSTAT) 0.4 MG SL tablet Place 0.4 mg under the tongue every 5 (five) minutes as needed.    . nystatin-triamcinolone (MYCOLOG II) cream Apply topically 4 (four) times daily. 30 g 0  . oxyCODONE-acetaminophen (PERCOCET) 7.5-325 MG tablet Take 1 tablet by mouth every 4 (four) hours as needed for moderate pain. Max APAP 3gm/24 hrs from all sources 180 tablet 0  . potassium chloride (K-DUR,KLOR-CON) 10 MEQ tablet Take 10 mEq by mouth 2 (two) times daily.    Marland Kitchen tiZANidine (ZANAFLEX) 4 MG capsule Take 4 mg by mouth 3 (three)  times daily.    . traZODone (DESYREL) 100 MG tablet Take 100 mg by mouth at bedtime.     No current facility-administered medications for this visit.    Allergies:  Ibuprofen   Social History: The patient  reports that he quit smoking about 4 months ago. His smoking use included Cigarettes. He smoked 0.50 packs per day. He has never used smokeless tobacco. He reports that he does not drink alcohol or use drugs.   ROS:  Please see the history of present illness. Otherwise, complete review of systems is positive for decreased hearing, insomnia.  All other systems are reviewed and negative.   Physical Exam: VS:   BP 120/72   Pulse 62   Ht  (1.651 m)   Wt 181 lb (82.1 kg)   SpO2 99%   BMI 30.12 kg/m , BMI Body mass index is 30.12 kg/m.  Wt Readings from Last 3 Encounters:  12/09/15 181 lb (82.1 kg)  09/03/15 177 lb (80.3 kg)  06/22/15 165 lb (74.8 kg)    General: Overweight, chronically ill-appearing male, no distress. HEENT: Conjunctiva and lids normal, oropharynx clear. Neck: Supple, no elevated JVP or carotid bruits, no thyromegaly. Lungs: Diminished breath sounds at the bases, nonlabored breathing at rest. Cardiac: Regular rate and rhythm, no S3, 2/6 systolic murmur, no pericardial rub. Abdomen: Protuberant, nontender, bowel sounds present, no guarding or rebound. Extremities: 1+ leg edema, distal pulses 2+. Skin: Warm and dry. Musculoskeletal: No kyphosis. Neuropsychiatric: Alert and oriented x3, affect grossly appropriate.  ECG: I personally reviewed the tracing from 04/29/2015 which showed sinus rhythm with PAC, incomplete right bundle branch block, and nonspecific ST changes.   Recent Labwork: 04/11/2015: TSH 1.082 05/15/2015: ALT 13; AST 17 06/20/2015: Magnesium 1.9 06/22/2015: B Natriuretic Peptide 91.0; BUN 33; Creatinine, Ser 1.61; Hemoglobin 9.5; Platelets 213; Potassium 4.1; Sodium 143  May 2017: BUN 32, creatinine 1.2, potassium 4.7  Other Studies Reviewed Today:  Lexiscan Cardiolite 11/14/2012 [facility]): Nondiagnostic ST segment changes, moderate sized septal scar as well as a region of scar within the inferior wall associated with minimal peri-infarct ischemia, LVEF 56%.  Echocardiogram 02/08/2015 [facility]): Mild LVH with LVEF 55-60%, mild to moderate left atrial enlargement, mild to moderate right atrial enlargement, sclerotic aortic valve without stenosis, suggestion of increased RV pressure and volume however RVSP was not able to be calculated.   Echocardiogram 04/12/2015: Study Conclusions  - Left ventricle: The cavity size was normal. Systolic function  was  normal. The estimated ejection fraction was in the range of 55%  to 60%. Wall motion was normal; there were no regional wall  motion abnormalities. Doppler parameters are consistent with  abnormal left ventricular relaxation (grade 1 diastolic  dysfunction). Mild basal septal hypertrophy. - Ventricular septum: Septal motion showed abnormal function and  dyssynergy. These changes are consistent with intraventricular  conduction delay. - Aortic valve: Moderately to severely calcified annulus.  Trileaflet; mildly thickened, mildly calcified leaflets. There  was mild stenosis. Peak velocity (S): 247 cm/s. Mean gradient  (S): 12 mm Hg. Valve area (Vmean): 1.45 cm^2. - Mitral valve: Calcified annulus. Mildly thickened leaflets . - Left atrium: The atrium was mildly to moderately dilated. - Right ventricle: The cavity size was moderately dilated. Systolic  function was moderately reduced. - Tricuspid valve: There was mild regurgitation. - Systemic veins: Unable to accurately assess respiratory variation  as patient is ventilated.  Impressions:  - When compared to the study report dated 02/08/15, there have been  no significant changes.  Assessment and Plan:  1. Chronic diastolic heart failure, plan to continue current diuretic dose. Weight is up 4 pounds from the last visit, we discussed sodium and fluid restrictions.  2. Symptomatically stable multivessel CAD status post CABG. Cardiolite from 2014 revealed inferior and septal scar with minimal peri-infarct ischemia. No active angina symptoms on medical therapy.  3. Essential hypertension, blood pressure is well controlled today.  4. Hyperlipidemia, remains off Lipitor for now. Keep follow with Dr. Sherryll Burger. Can consider re-addition of statin therapy depending on follow-up lipid panel and clinical status.  Current medicines were reviewed with the patient today.  Disposition: Follow-up with me in 6  months.  Signed, Jonelle Sidle, MD, [facility] 12/09/2015 9:23 AM    [facility] [address] [place], [place], [place] [address] Phone: [phone]; Fax: [phone]

## 2015-12-09 ENCOUNTER — Ambulatory Visit (INDEPENDENT_AMBULATORY_CARE_PROVIDER_SITE_OTHER): Payer: Medicare HMO | Admitting: Cardiology

## 2015-12-09 ENCOUNTER — Encounter: Payer: Self-pay | Admitting: Cardiology

## 2015-12-09 VITALS — BP 120/72 | HR 62 | Ht 65.0 in | Wt 181.0 lb

## 2015-12-09 DIAGNOSIS — I2510 Atherosclerotic heart disease of native coronary artery without angina pectoris: Secondary | ICD-10-CM | POA: Diagnosis not present

## 2015-12-09 DIAGNOSIS — I5032 Chronic diastolic (congestive) heart failure: Secondary | ICD-10-CM

## 2015-12-09 DIAGNOSIS — E785 Hyperlipidemia, unspecified: Secondary | ICD-10-CM

## 2015-12-09 DIAGNOSIS — I10 Essential (primary) hypertension: Secondary | ICD-10-CM

## 2015-12-09 NOTE — Patient Instructions (Signed)

## 2015-12-31 ENCOUNTER — Emergency Department (HOSPITAL_COMMUNITY)
Admission: EM | Admit: 2015-12-31 | Discharge: 2015-12-31 | Discharge status: Against Medical Advice | Payer: Medicare HMO | Attending: Emergency Medicine | Admitting: Emergency Medicine

## 2015-12-31 ENCOUNTER — Emergency Department (HOSPITAL_COMMUNITY): Payer: Medicare HMO

## 2015-12-31 ENCOUNTER — Encounter (HOSPITAL_COMMUNITY): Payer: Self-pay | Admitting: Emergency Medicine

## 2015-12-31 DIAGNOSIS — Z87891 Personal history of nicotine dependence: Secondary | ICD-10-CM | POA: Diagnosis not present

## 2015-12-31 DIAGNOSIS — I110 Hypertensive heart disease with heart failure: Secondary | ICD-10-CM | POA: Insufficient documentation

## 2015-12-31 DIAGNOSIS — I2510 Atherosclerotic heart disease of native coronary artery without angina pectoris: Secondary | ICD-10-CM | POA: Diagnosis not present

## 2015-12-31 DIAGNOSIS — Z951 Presence of aortocoronary bypass graft: Secondary | ICD-10-CM | POA: Diagnosis not present

## 2015-12-31 DIAGNOSIS — M6281 Muscle weakness (generalized): Secondary | ICD-10-CM | POA: Diagnosis not present

## 2015-12-31 DIAGNOSIS — R29898 Other symptoms and signs involving the musculoskeletal system: Secondary | ICD-10-CM

## 2015-12-31 DIAGNOSIS — R531 Weakness: Secondary | ICD-10-CM

## 2015-12-31 DIAGNOSIS — J449 Chronic obstructive pulmonary disease, unspecified: Secondary | ICD-10-CM | POA: Insufficient documentation

## 2015-12-31 DIAGNOSIS — E119 Type 2 diabetes mellitus without complications: Secondary | ICD-10-CM | POA: Diagnosis not present

## 2015-12-31 DIAGNOSIS — Z7984 Long term (current) use of oral hypoglycemic drugs: Secondary | ICD-10-CM | POA: Insufficient documentation

## 2015-12-31 DIAGNOSIS — J9622 Acute and chronic respiratory failure with hypercapnia: Secondary | ICD-10-CM | POA: Insufficient documentation

## 2015-12-31 DIAGNOSIS — I5033 Acute on chronic diastolic (congestive) heart failure: Secondary | ICD-10-CM | POA: Diagnosis not present

## 2015-12-31 DIAGNOSIS — R200 Anesthesia of skin: Secondary | ICD-10-CM | POA: Diagnosis present

## 2015-12-31 LAB — CBC WITH DIFFERENTIAL/PLATELET
BASOS ABS: 0 10*3/uL (ref 0.0–0.1)
BASOS PCT: 1 %
EOS PCT: 4 %
Eosinophils Absolute: 0.4 10*3/uL (ref 0.0–0.7)
HEMATOCRIT: 43.2 % (ref 39.0–52.0)
Hemoglobin: 13.7 g/dL (ref 13.0–17.0)
LYMPHS PCT: 28 %
Lymphs Abs: 2.5 10*3/uL (ref 0.7–4.0)
MCH: 30.4 pg (ref 26.0–34.0)
MCHC: 31.7 g/dL (ref 30.0–36.0)
MCV: 96 fL (ref 78.0–100.0)
Monocytes Absolute: 0.7 10*3/uL (ref 0.1–1.0)
Monocytes Relative: 7 %
NEUTROS ABS: 5.2 10*3/uL (ref 1.7–7.7)
Neutrophils Relative %: 60 %
PLATELETS: 195 10*3/uL (ref 150–400)
RBC: 4.5 MIL/uL (ref 4.22–5.81)
RDW: 15.6 % — AB (ref 11.5–15.5)
WBC: 8.7 10*3/uL (ref 4.0–10.5)

## 2015-12-31 LAB — BASIC METABOLIC PANEL
Anion gap: 10 (ref 5–15)
BUN: 34 mg/dL — ABNORMAL HIGH (ref 6–20)
CALCIUM: 9.5 mg/dL (ref 8.9–10.3)
CO2: 28 mmol/L (ref 22–32)
Chloride: 101 mmol/L (ref 101–111)
Creatinine, Ser: 1.44 mg/dL — ABNORMAL HIGH (ref 0.61–1.24)
GFR calc Af Amer: 54 mL/min — ABNORMAL LOW (ref >60)
GFR, EST NON AFRICAN AMERICAN: 47 mL/min — AB (ref >60)
Glucose, Bld: 100 mg/dL — ABNORMAL HIGH (ref 65–99)
POTASSIUM: 4.5 mmol/L (ref 3.5–5.1)
SODIUM: 139 mmol/L (ref 135–145)

## 2015-12-31 MED ORDER — LORAZEPAM 2 MG/ML IJ SOLN
1.0000 mg | Freq: Once | INTRAMUSCULAR | Status: AC
  Administered 2015-12-31: 1 mg via INTRAVENOUS
  Filled 2015-12-31: qty 1

## 2015-12-31 MED ORDER — LORAZEPAM 2 MG/ML IJ SOLN: 2.0000 mg | Freq: Once | INTRAMUSCULAR | Status: DC

## 2015-12-31 MED ORDER — MORPHINE SULFATE (PF) 4 MG/ML IV SOLN: 4.0000 mg | Freq: Once | INTRAVENOUS | Status: DC

## 2015-12-31 MED ORDER — FENTANYL CITRATE (PF) 100 MCG/2ML IJ SOLN
50.0000 ug | Freq: Once | INTRAMUSCULAR | Status: AC
  Administered 2015-12-31: 50 ug via INTRAVENOUS
  Filled 2015-12-31: qty 2

## 2015-12-31 MED ORDER — LORAZEPAM 2 MG/ML IJ SOLN
2.0000 mg | Freq: Once | INTRAMUSCULAR | Status: AC
  Administered 2015-12-31: 2 mg via INTRAVENOUS
  Filled 2015-12-31: qty 1

## 2015-12-31 NOTE — ED Notes (Signed)
MD at bedside. 

## 2015-12-31 NOTE — ED Notes (Signed)
Patient unable to keep still to have MRI completed, EDP Wickline wanted to send [facility], patient decided to leave AMA, IV removed, patient transported via wheelchair to car, accompanied by wife and grandson.  EDP Wickline notified.

## 2015-12-31 NOTE — ED Triage Notes (Signed)
Pt c/o bilateral lower extremity weakness, pain, and numbness since Saturday. States it has progressively gotten worse. Pt states that now numbness and pain now radiates up RT thigh. Denies blurred vision, HA, or slurred speech.

## 2015-12-31 NOTE — ED Provider Notes (Signed)
[facility]-EMERGENCY DEPT Provider Note   CSN: [id number] Arrival date & time: 12/31/15  1650     History   Chief Complaint Chief Complaint  Patient presents with  . Numbness  . Weakness    HPI Miguel Mendoza is a 75 y.o. male.  The history is provided by the patient and a relative.  Weakness  Primary symptoms include focal weakness, loss of sensation. This is a new problem. The current episode started more than 2 days ago. The problem has been gradually worsening. There was left lower extremity and right lower extremity focality noted. There has been no fever. Pertinent negatives include no chest pain and no vomiting. Associated medical issues do not include trauma.  Patient presents with onset of leg weakness, burning/numbness and difficulty walking He reports he was walking to mailbox 3 days ago without difficulty On his way back he began having burning pain/numbness in both legs, distally to knee.  Since that time pain is worse and now has weakness in legs He reports difficulty walking He now reports pain is also involving right thigh No new back pain (h/o chronic back pain previous lumbar fusion in the 1980s) No abd pain No discoloration of his legs No urinary incontinence is reported.  Past Medical History:  Diagnosis Date  . Anxiety   . CAD (coronary artery disease)    Multivessel status post stent to LAD 1998 then CABG 2003  . Cervical disc disease   . Dyslipidemia   . Essential hypertension   . GERD (gastroesophageal reflux disease)   . History of stroke 411997  . Migraine headache   . Myocardial infarction (HCC)   . Respiratory failure (HCC)   . Type 2 diabetes mellitus [facility](HCC)     Patient Active Problem List   Diagnosis Date Noted  . Edema 06/18/2015  . Hyperkalemia 06/04/2015  . Generalized anxiety disorder 06/04/2015  . Anemia due to other cause   . Paroxysmal ventricular tachycardia (HCC) 05/14/2015  . FTT (failure to thrive) in adult 05/11/2015  . Rash and  nonspecific skin eruption 05/10/2015  . Urinary retention 05/10/2015  . Protein-calorie malnutrition, severe 05/02/2015  . Pressure ulcer 04/30/2015  . Palliative care encounter   . DNR (do not resuscitate) discussion   . SVT (supraventricular tachycardia) (HCC) 04/29/2015  . Dehydration with hypernatremia 04/29/2015  . Acute respiratory failure (HCC) 04/27/2015  . Acute on chronic respiratory failure with hypercapnia (HCC) 04/27/2015  . Encephalopathy acute 04/27/2015  . COPD (chronic obstructive pulmonary disease) (HCC) 04/27/2015  . Status post thoracentesis   . Hyperosmolality and/or hypernatremia   . Uremia   . Tachycardia   . 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 Acute on chronic diastolic CHF (congestive heart failure), NYHA class 1 (HCC)   . Ventilator dependence (HCC) 04/12/2015  . Acute respiratory failure with hypercapnia (HCC) 04/12/2015  . COPD with acute exacerbation (HCC) 04/12/2015  . Diastolic CHF, acute on chronic (HCC) 04/12/2015  . CAP (community acquired pneumonia) 04/12/2015  . Pleural effusion, right 04/11/2015  . Weight loss, abnormal 10/16/2011  . TOBACCO USER 06/03/2009  . AAA 06/03/2009  . Diabetes (HCC) 02/27/2009  . Pure hypercholesterolemia 02/27/2009  . MIGRAINE UNSP W/O INTRACT W/O STATUS MIGRAINOSUS 02/27/2009  . Essential hypertension 02/27/2009  . CORONARY ATHEROSCLEROSIS NATIVE CORONARY ARTERY 02/27/2009  . VENTRICULAR FIBRILLATION 02/27/2009  . SYNCOPE 02/27/2009  . CHEST PAIN UNSPECIFIED 02/27/2009  . 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 ANTERIOR CERVICAL DECOMP/DISCECTOMY FUSION    . BACK SURGERY    . CAROTID ENDARTERECTOMY Left   . CORONARY ARTERY BYPASS GRAFT  2003   Dr. Cornelius Moras - LIMA to LAD, SVG to OM, SVG to RCA       Home Medications    Prior to Admission  medications   Medication Sig Start Date End Date Taking? Authorizing Provider  albuterol (PROVENTIL HFA;VENTOLIN HFA) 108 (90 Base) MCG/ACT inhaler Inhale 2 puffs into the lungs every 6 (six) hours as needed for wheezing or shortness of breath.    Historical Provider, MD  clonazePAM (KLONOPIN) 1 MG tablet Take 1 mg by mouth daily.    Historical Provider, MD  Cyanocobalamin (VITAMIN B-12 IJ) Inject as directed once a week.    Historical Provider, MD  esomeprazole (NEXIUM) 40 MG capsule Take 40 mg by mouth daily before breakfast.    Historical Provider, MD  furosemide (LASIX) 40 MG tablet Take 1.5 tablets (60 mg total) by mouth daily. 09/03/15   Jonelle Sidle, MD  gabapentin (NEURONTIN) 400 MG capsule Take 400 mg by mouth 3 (three) times daily.    Historical Provider, MD  metFORMIN (GLUCOPHAGE) 500 MG tablet Take 1,000 mg by mouth 2 (two) times daily with a meal.    Historical Provider, MD  nitroGLYCERIN (NITROSTAT) 0.4 MG SL tablet Place 0.4 mg under the tongue every 5 (five) minutes as needed.    Historical Provider, MD  nystatin-triamcinolone (MYCOLOG II) cream Apply topically 4 (four) times daily. 04/26/15   Henderson Cloud, MD  oxyCODONE-acetaminophen (PERCOCET) 7.5-325 MG tablet Take 1 tablet by mouth every 4 (four) hours as needed for moderate pain. Max APAP 3gm/24 hrs from all sources 06/20/15   Tiffany L Reed, DO  potassium chloride (K-DUR,KLOR-CON) 10 MEQ tablet Take 10 mEq by mouth 2 (two) times daily.    Historical Provider, MD  tiZANidine (ZANAFLEX) 4 MG capsule Take 4 mg by mouth 3 (three) times daily.    Historical Provider, MD  traZODone (DESYREL) 100 MG tablet Take 100 mg by mouth at bedtime.    Historical Provider, MD    Family History Family History  Problem Relation Age of Onset  . Heart attack Father   . Heart attack Mother   . Heart attack Brother     Social History Social History  Substance Use Topics  . Smoking status: Former Smoker    Packs/day: 0.50     Types: Cigarettes    Quit date: 08/07/2015  . Smokeless tobacco: Never Used  . Alcohol use No     Allergies   Ibuprofen   Review of Systems Review of Systems  Constitutional: Negative for fever.  Cardiovascular: Negative for chest pain.  Gastrointestinal: Negative for abdominal pain and vomiting.  Genitourinary: Negative for difficulty urinating.  Musculoskeletal:       Chronic back pain   Neurological: Positive for focal weakness, weakness and numbness. Negative for syncope.  All other systems reviewed and are negative.    Physical Exam Updated Vital Signs BP 123/76   Pulse 70   Temp 98.5 F (36.9 C) (Oral)   Resp 16   Ht 5\' 5"  (1.651 m)   Wt 79.4 kg   SpO2 93%   BMI 29.12 kg/m   Physical Exam CONSTITUTIONAL: Well developed/well nourished HEAD: Normocephalic/atraumatic EYES: EOMI/PERRL ENMT: Mucous membranes moist NECK: supple no meningeal signs SPINE/BACK:entire spine nontender,  well healed midline lumbar scar CV: S1/S2 noted, no murmurs/rubs/gallops noted LUNGS: Lungs are clear to auscultation bilaterally, no apparent distress ABDOMEN: soft, nontender, no rebound or guarding GU:no cva tenderness NEURO: Awake/alert, no focal weakness in his arm, and no arm drift, he has weakness with bilateral hip flexion, bilateral knee flex/extension and ankle dorsi/plantar flexion.  He is able to wiggle toes but appears to have weakness with great toe extension bilaterally.  no clonus bilaterally, plantar reflex appropriate (toes downgoing),he reports numbness/burning in both his lower extremities.  Equal patellar/achilles reflex noted  in bilateral lower extremities.   No numbness noted above waist. EXTREMITIES: pulses normal, full ROM.  No discoloration in legs.  Femoral pulses intact.  Distal foot pulses intact. SKIN: warm, color normal PSYCH: no abnormalities of mood noted, alert and oriented to situation  ED Treatments / Results  Labs (all labs ordered are listed, but  only abnormal results are displayed) Labs Reviewed  BASIC METABOLIC PANEL - Abnormal; Notable for the following:       Result Value   Glucose, Bld 100 (*)    BUN 34 (*)    Creatinine, Ser 1.44 (*)    GFR calc non Af Amer 47 (*)    GFR calc Af Amer 54 (*)    All other components within normal limits  CBC WITH DIFFERENTIAL/PLATELET - Abnormal; Notable for the following:    RDW 15.6 (*)    All other components within normal limits    EKG  EKG Interpretation None       Radiology No results found.  Procedures Procedures (including critical care time)  Medications Ordered in ED Medications  fentaNYL (SUBLIMAZE) injection 50 mcg (50 mcg Intravenous Given 12/31/15 1810)  LORazepam (ATIVAN) injection 1 mg (1 mg Intravenous Given 12/31/15 1910)  LORazepam (ATIVAN) injection 2 mg (2 mg Intravenous Given 12/31/15 1946)     Initial Impression / Assessment and Plan / ED Course  I have reviewed the triage vital signs and the nursing notes.  Pertinent labs results that were available during my care of the patient were reviewed by me and considered in my medical decision making (see chart for details).  Clinical Course    Pt with progressive leg weakness/numbness over past 3 days He reports he can't walk He has obvious weakness to both legs while lying in bed No signs of vascular occlusion He will require MR imaging Per records, neuro exam from June 2017 found in EPIC he had appropriate strength at that time      After multiple attempts, pt was unable to tolerate MRI After discussion, I advised transferring to [facility] as the MRI is much larger, less likely to cause claustrophobia and definitive management (neurosurgery) would be present if needed He refused He reports he wanted to go home I advised him that paralysis is possible I advised him and his family to discuss this, and when I return to room he had already left the department See nursing notes for details  Final  Clinical Impressions(s) / ED Diagnoses   Final diagnoses:  Weakness    New Prescriptions New Prescriptions   No medications on file     Zadie Rhine, MD 12/31/15 2050

## 2016-01-28 ENCOUNTER — Other Ambulatory Visit: Payer: Self-pay | Admitting: Cardiology

## 2016-03-23 ENCOUNTER — Ambulatory Visit (INDEPENDENT_AMBULATORY_CARE_PROVIDER_SITE_OTHER): Payer: Medicare HMO | Admitting: Otolaryngology

## 2016-03-23 DIAGNOSIS — H6123 Impacted cerumen, bilateral: Secondary | ICD-10-CM | POA: Diagnosis not present

## 2016-03-23 DIAGNOSIS — H903 Sensorineural hearing loss, bilateral: Secondary | ICD-10-CM | POA: Diagnosis not present

## 2017-01-04 ENCOUNTER — Ambulatory Visit: Payer: Medicare HMO | Admitting: Cardiology

## 2017-05-22 IMAGING — CT CT HEAD W/O CM
1 series · 16 of 30 positions shown, 20 images · non-contrast
Comparison: 02/07/2015

CLINICAL DATA: Altered mental status.  Evaluate for bleed.

EXAM:
CT HEAD WITHOUT CONTRAST
TECHNIQUE: Contiguous axial images were obtained from the base of the skull
through the vertex without intravenous contrast.

[Series 2: headtrauma 4.8 h37s · axial · 0.43mm/px · z∈[+1175,+1304]mm · 16 of 30 slices shown, 20 images]
[im 2/30  brain]
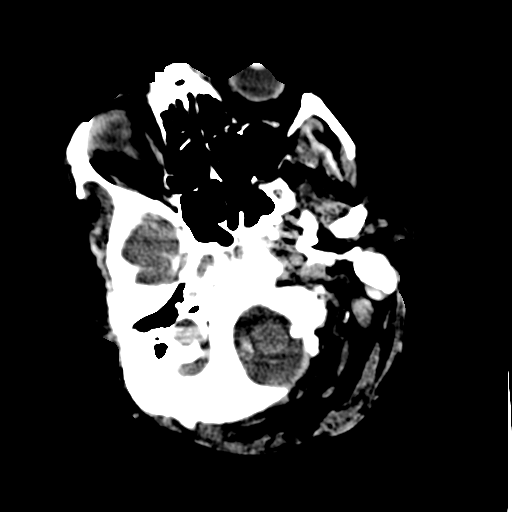
[im 2/30  bone]
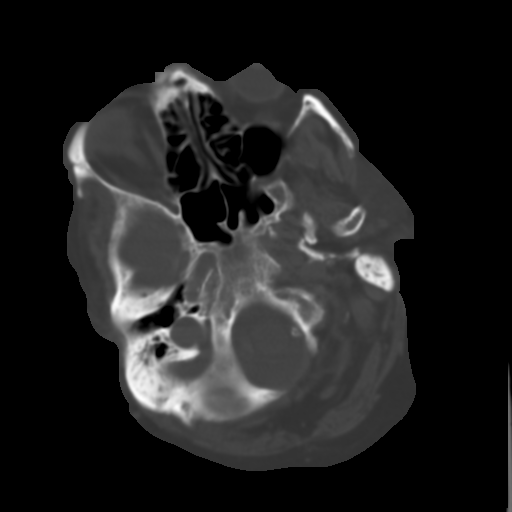
[im 4/30  brain]
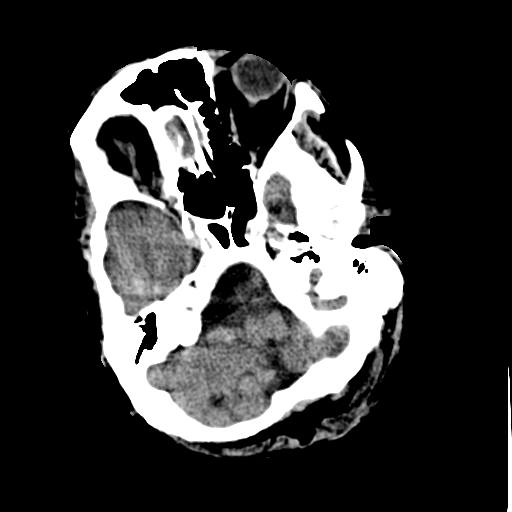
[im 6/30  brain]
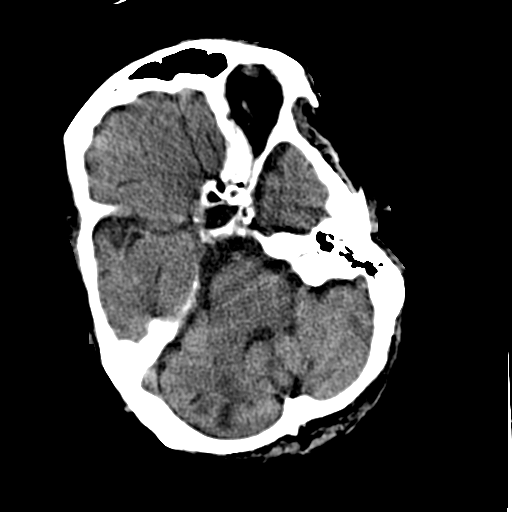
[im 8/30  brain]
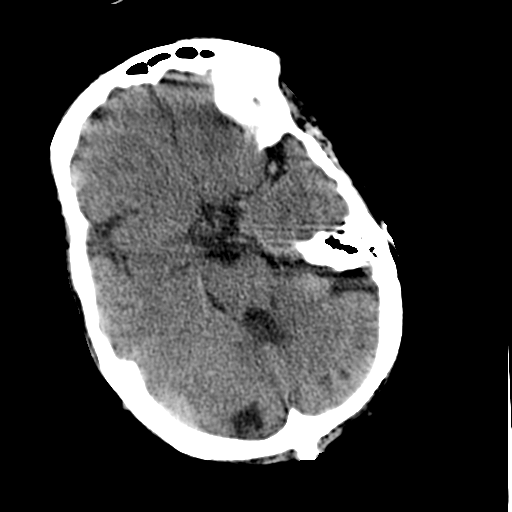
[im 9/30  brain]
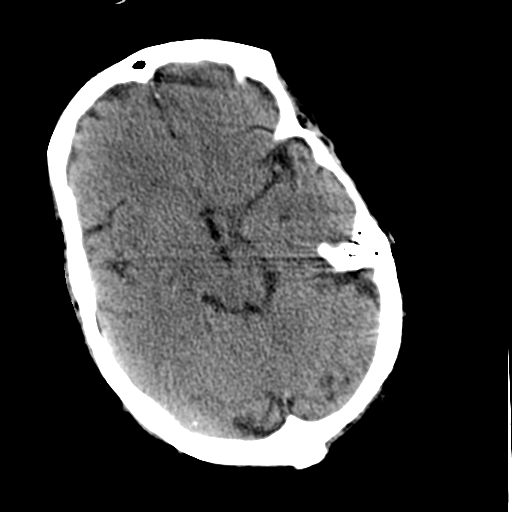
[im 9/30  bone]
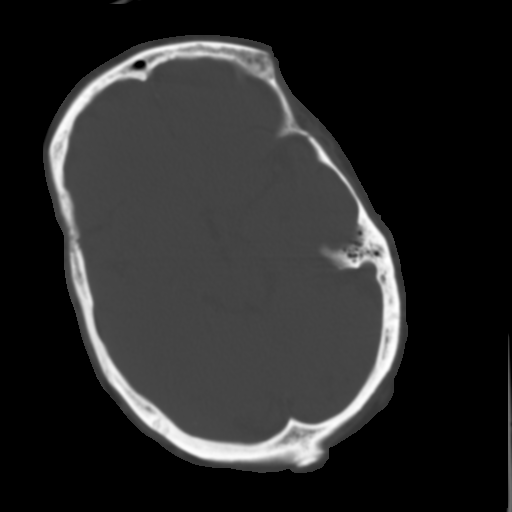
[im 11/30  brain]
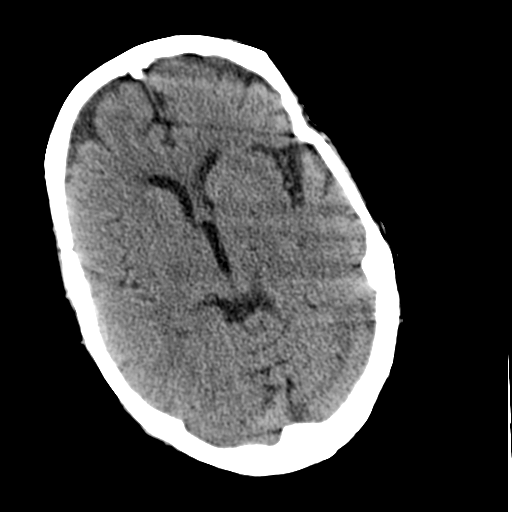
[im 13/30  brain]
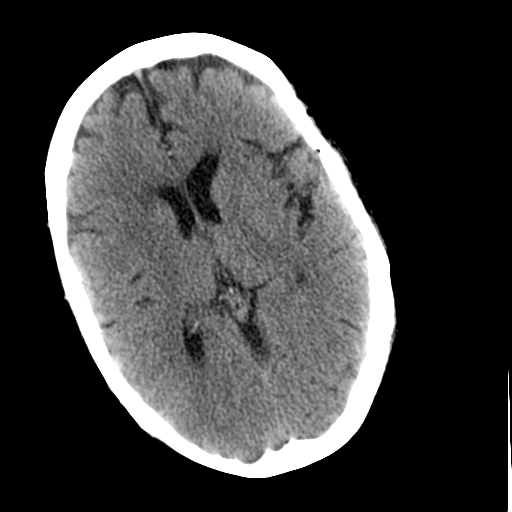
[im 15/30  brain]
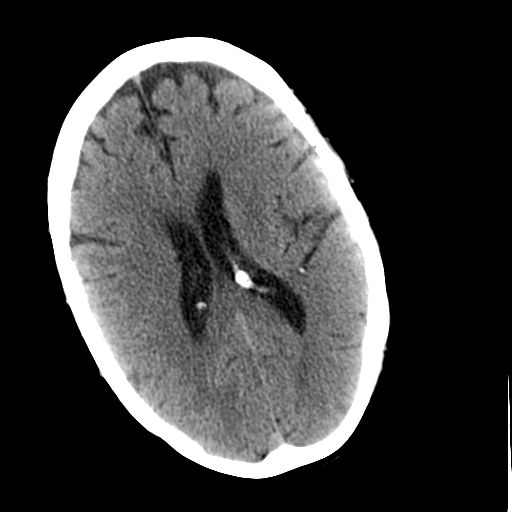
[im 16/30  brain]
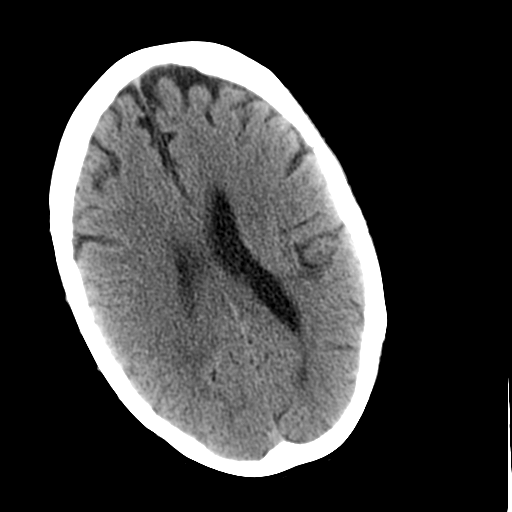
[im 16/30  bone]
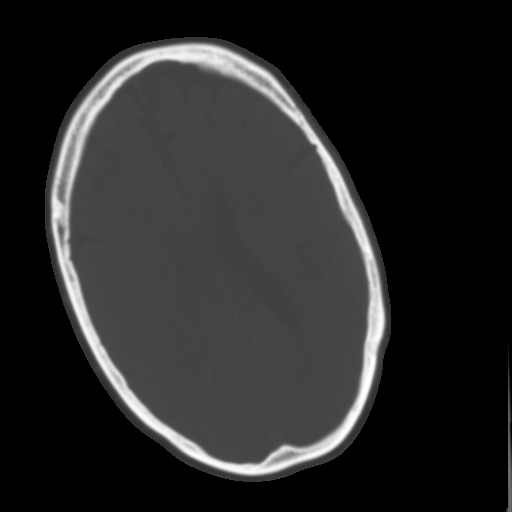
[im 18/30  brain]
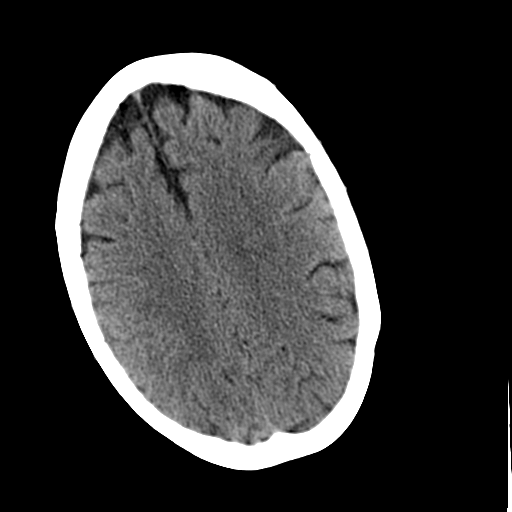
[im 20/30  brain]
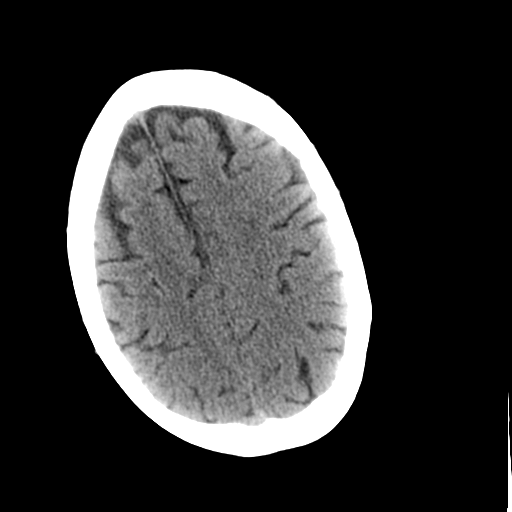
[im 22/30  brain]
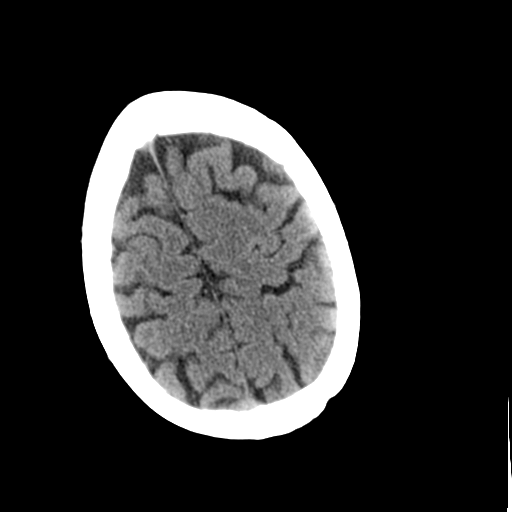
[im 23/30  brain]
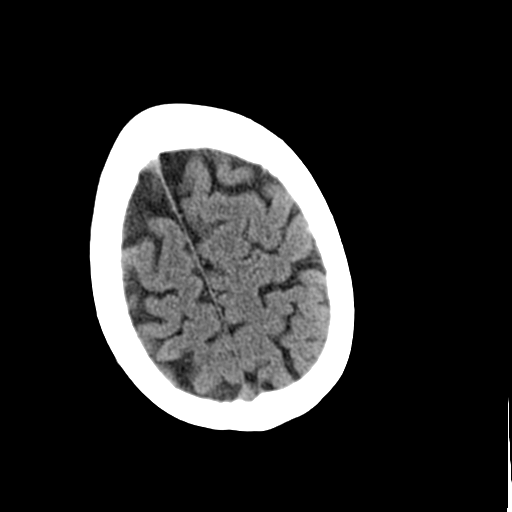
[im 23/30  bone]
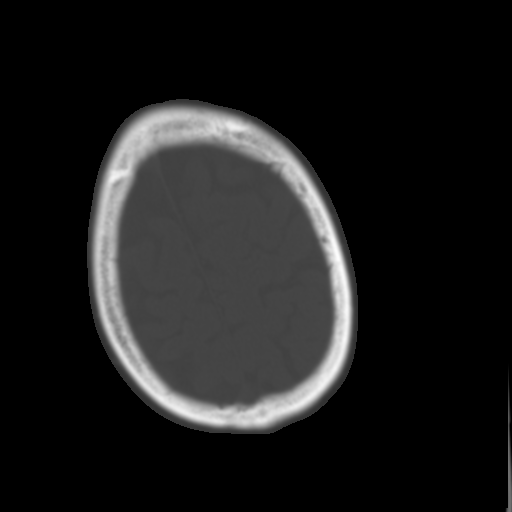
[im 25/30  brain]
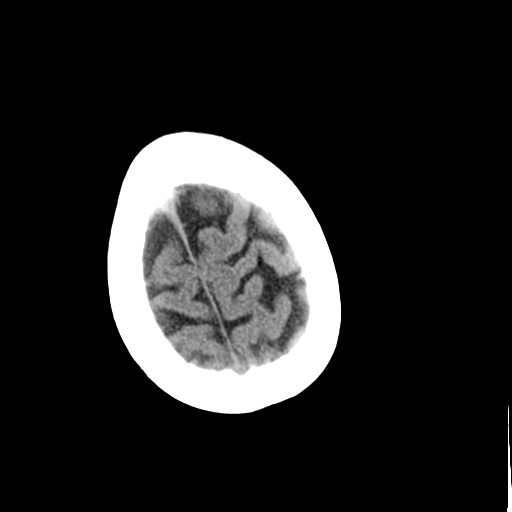
[im 27/30  brain]
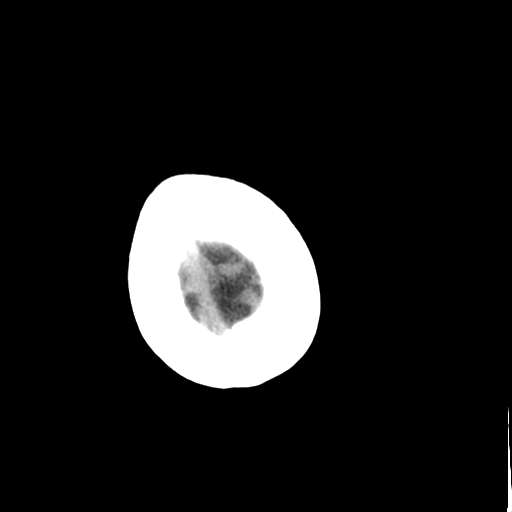
[im 29/30  brain]
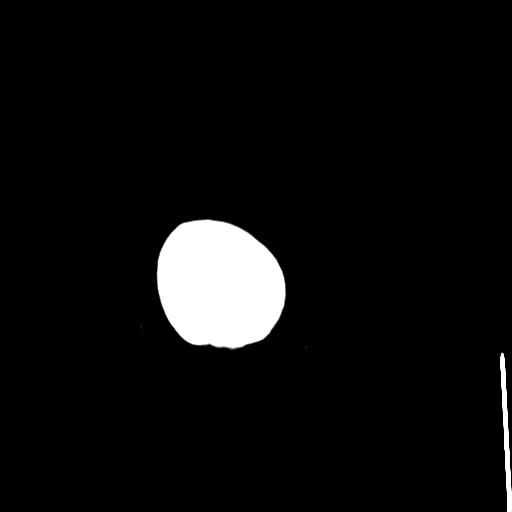

[16 of 30 positions shown; findings below may reference images not displayed]

FINDINGS: Sinuses/Soft tissues: Mild motion degradation. Clear paranasal
sinuses and mastoid air cells.

Intracranial: Cerebral atrophy. Right and probable left-sided remote
cerebellar infarcts. Mild low density in the periventricular white
matter likely related to small vessel disease. Vertebral and carotid
atherosclerosis. No mass lesion, hemorrhage, hydrocephalus, acute
infarct, intra-axial, or extra-axial fluid collection.
IMPRESSION: 1.  No acute intracranial abnormality.
2. Motion degradation.
3.  Cerebral atrophy and small vessel ischemic change.
4. Right and probable left-sided remote cerebellar infarcts.

## 2017-05-23 IMAGING — CT CT CHEST W/O CM
2 of 3 series · 15 of 36 positions shown, 18 images · non-contrast
Comparison: 02/07/2015

CLINICAL DATA: Respiratory failure.

EXAM:
CT CHEST WITHOUT CONTRAST
TECHNIQUE: Multidetector CT imaging of the chest was performed following the
standard protocol without IV contrast.

[Series 2: chestroutine 5.0 b40f · axial · 0.69mm/px · z∈[+994,+1254]mm · 12 of 62 slices shown, 15 images]
[im 5/62  mediastinal]
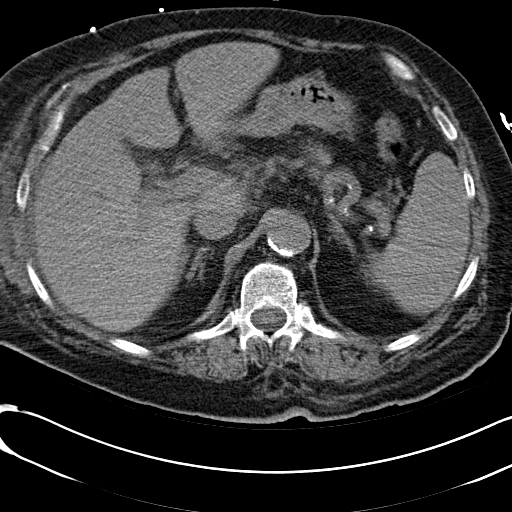
[im 5/62  lung]
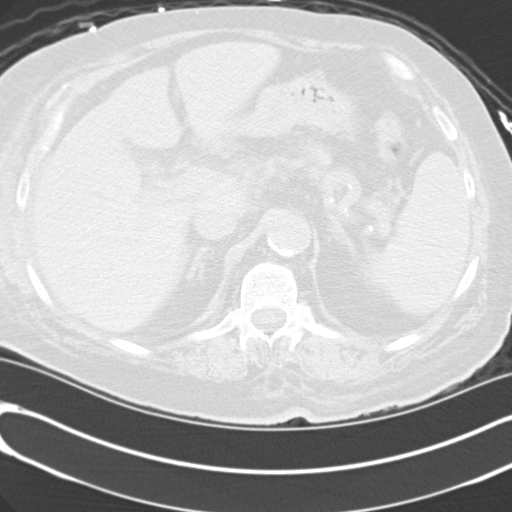
[im 10/62  lung]
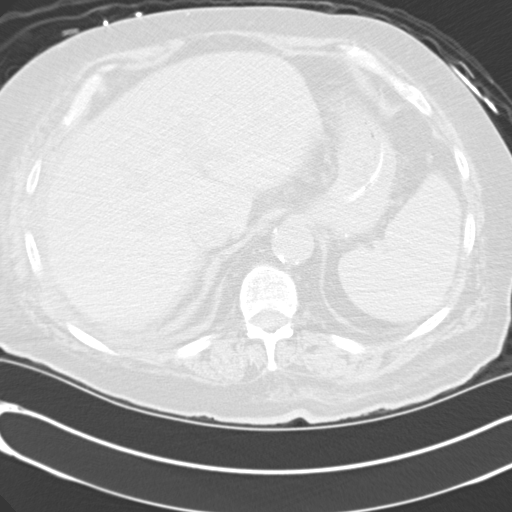
[im 14/62  lung]
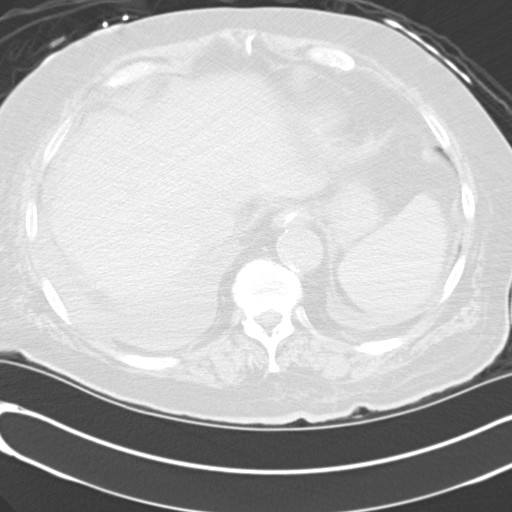
[im 19/62  lung]
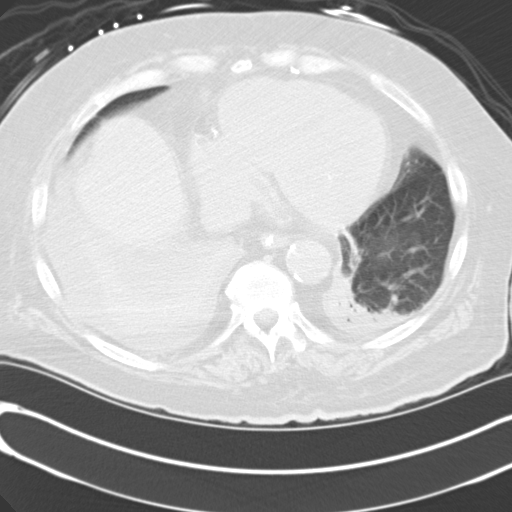
[im 23/62  mediastinal]
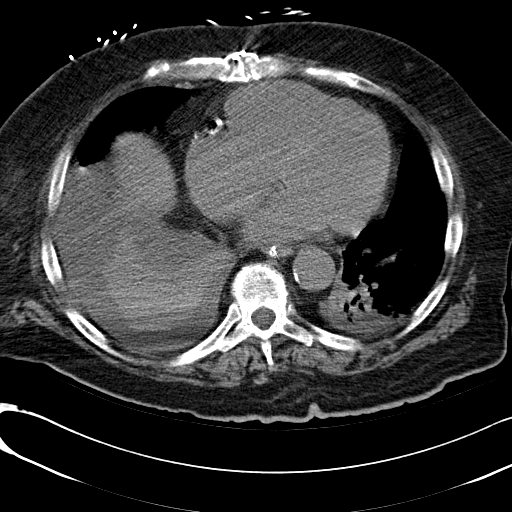
[im 23/62  lung]
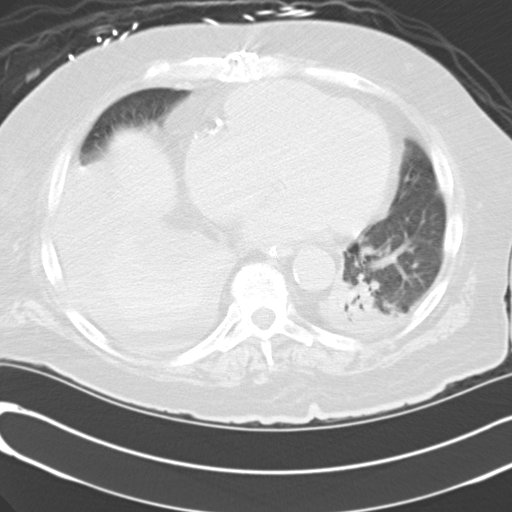
[im 28/62  lung]
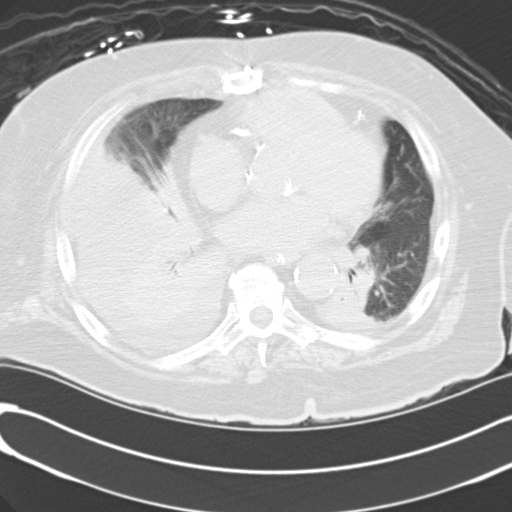
[im 34/62  lung]
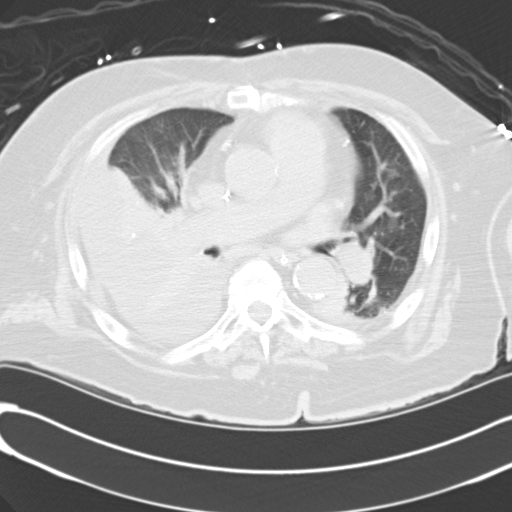
[im 39/62  lung]
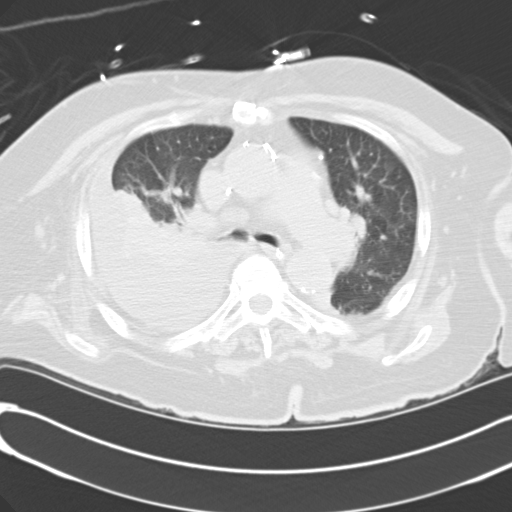
[im 43/62  mediastinal]
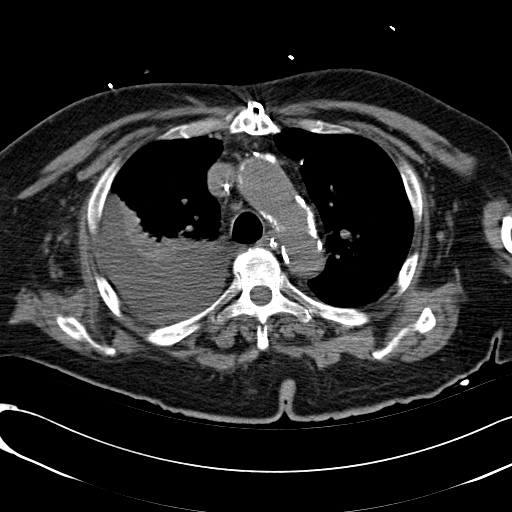
[im 43/62  lung]
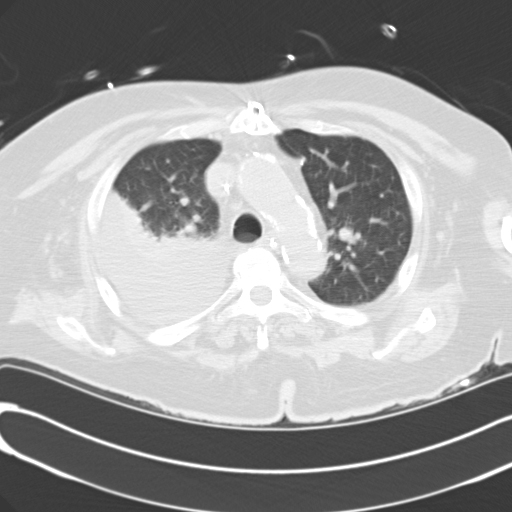
[im 48/62  lung]
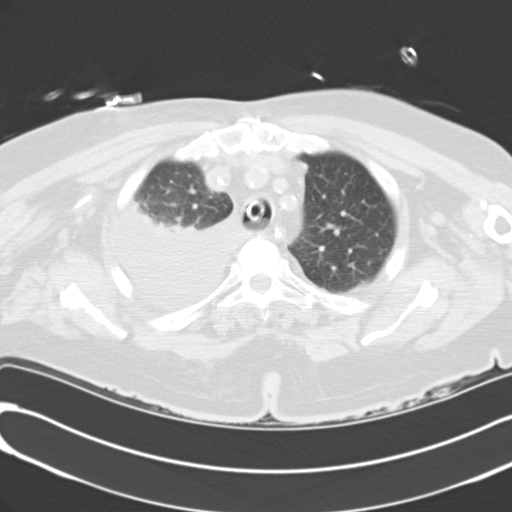
[im 52/62  lung]
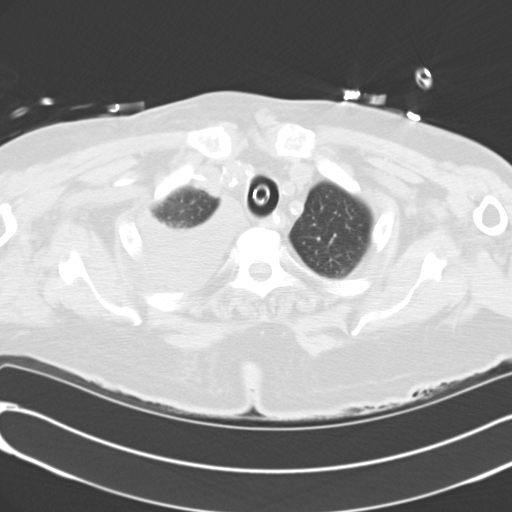
[im 57/62  lung]
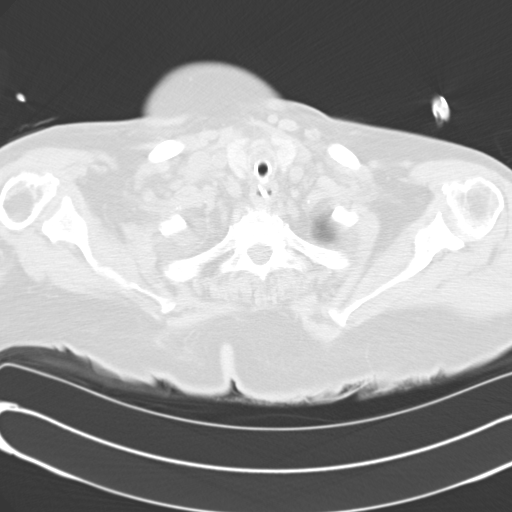

[Series 4: mpr coro 3mm · coronal · 0.62mm/px · 3 of 84 slices shown]
[im 17/84  lung]
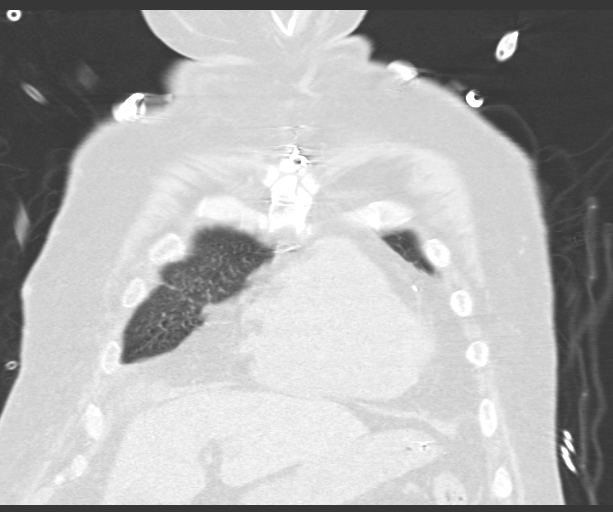
[im 34/84  lung]
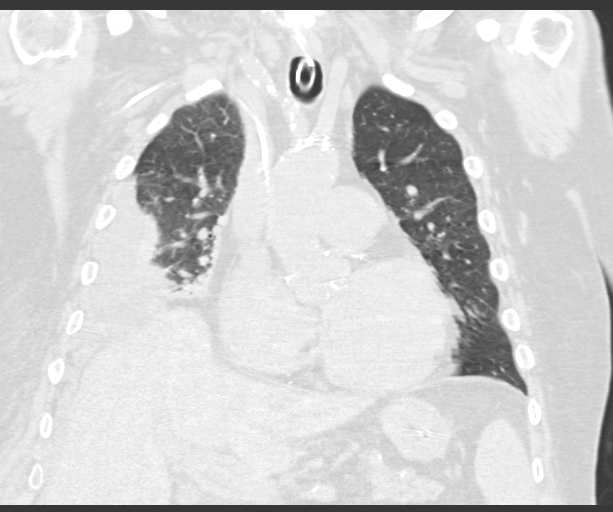
[im 50/84  lung]
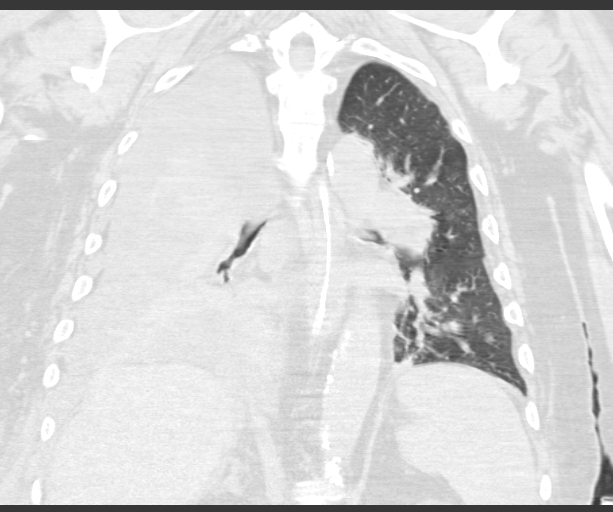

[15 of 36 positions shown; findings below may reference images not displayed]

FINDINGS: Mediastinum: Previous median sternotomy and CABG procedure. Moderate
cardiac enlargement. Aortic atherosclerosis noted. The trachea is
patent and midline. The ET tube tip is above the carina. OG tube
appears scratch set there is a nasogastric tube within a normal
appearing esophagus. Prominent mediastinal nodes are identified.
Index right paratracheal lymph node measures 1.4 cm, image 23/
series 2. Unchanged from previous exam. No axillary or
supraclavicular adenopathy.

Lungs/Pleura: Moderate to large right pleural effusion identified.
There is airspace consolidation involving the entire right lower
lobe. This appears progressive when compared with previous exam.
Small left pleural effusion with posterior medial consolidation is
identified.

Upper Abdomen: The adrenal glands are normal. Normal appearance of
the liver and spleen. The visualized portions of the pancreas appear
normal.

Musculoskeletal: Spondylosis noted within the thoracic spine. No
aggressive lytic or sclerotic bone lesions.
IMPRESSION: 1. Interval increase in volume of right pleural effusion with
complete consolidation of the right lower lobe.
2. New posterior medial consolidation of the left lower lobe.
3. Cardiac enlargement and aortic atherosclerosis.
4. Enlarged right paratracheal lymph node. Similar to previous exam.

## 2017-05-23 IMAGING — CR DG CHEST 1V PORT
1 series · 1 of 1 positions shown · non-contrast
Comparison: April 11, 2015.

CLINICAL DATA: Endotracheal tube placement.

EXAM:
PORTABLE CHEST 1 VIEW

[ap portable]
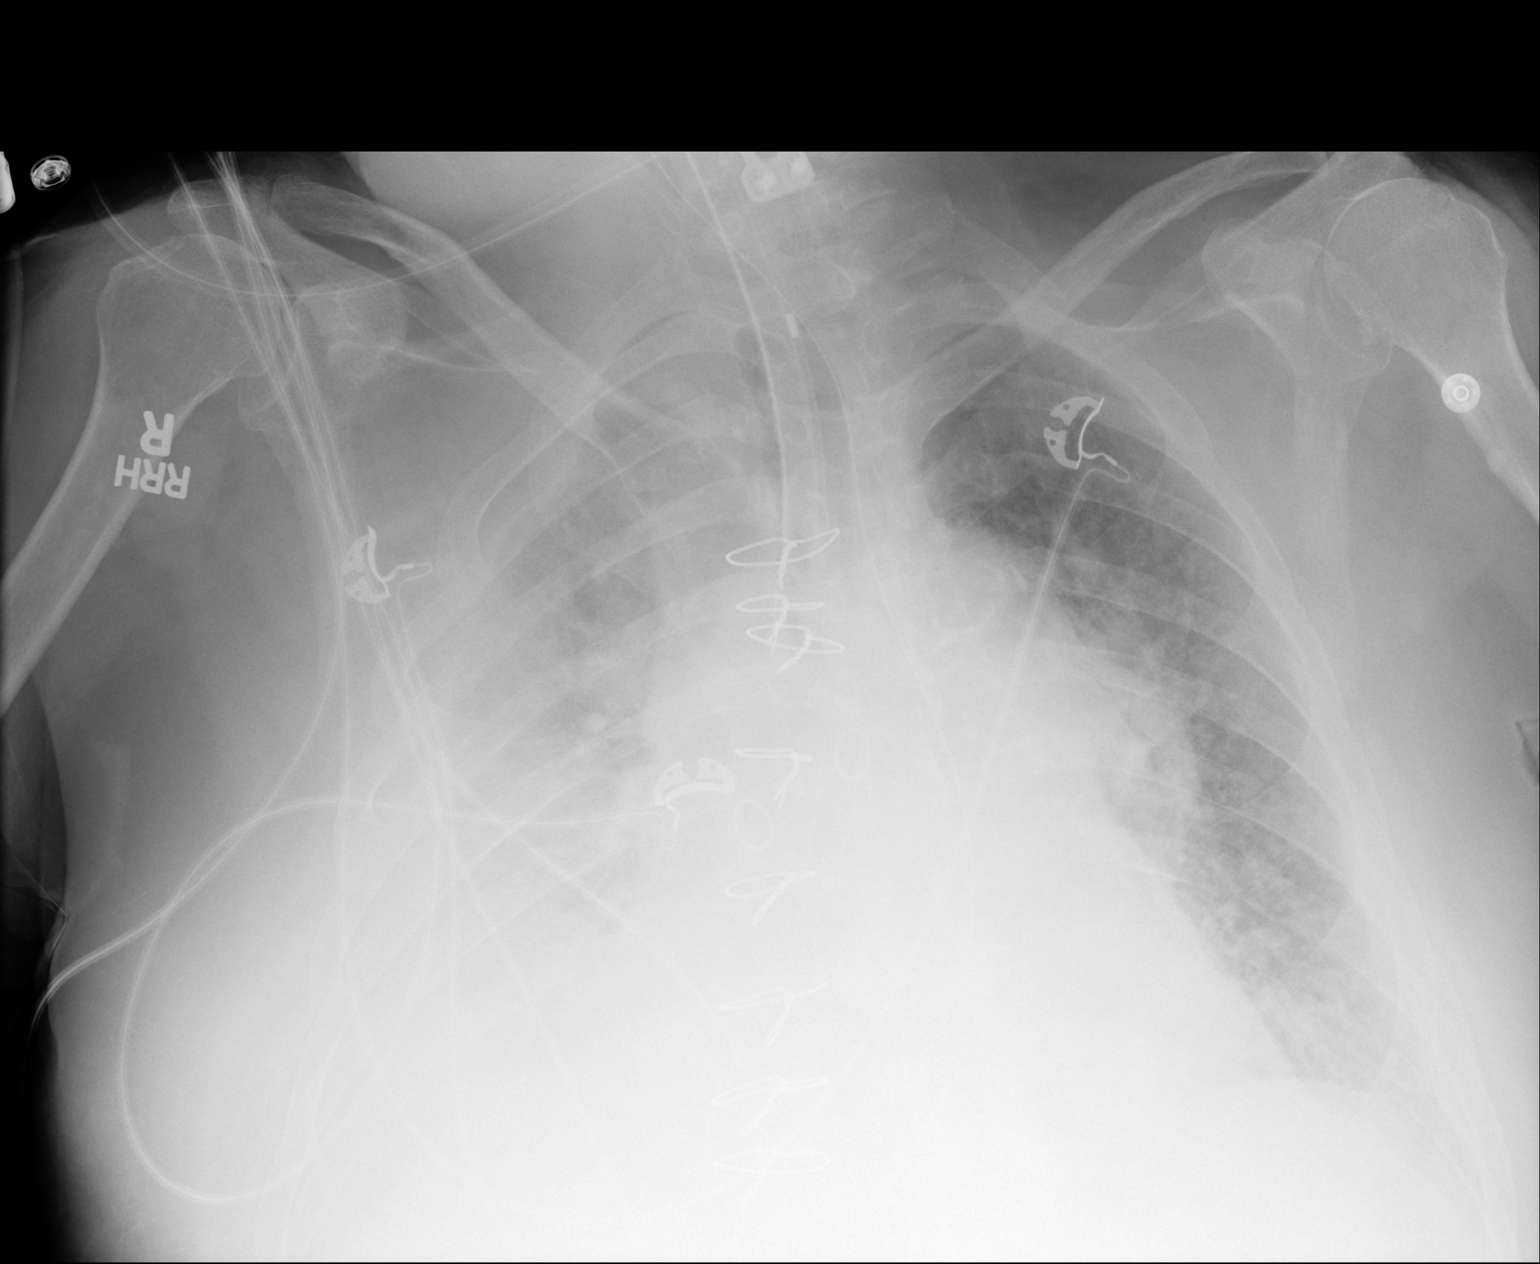

[1 of 1 positions shown; findings below may reference images not displayed]

FINDINGS: Stable cardiomegaly. Status post coronary artery bypass graft.
Patient is rotated to the right. Mild central pulmonary vascular
congestion is noted. Moderate right pleural effusion is again noted
with probable underlying atelectasis or infiltrate. No pneumothorax
is noted. Endotracheal tube is seen projected over tracheal air
shadow with distal tip at least 2 cm above the carina. Nasogastric
tube is seen entering the stomach.
IMPRESSION: Endotracheal tube appears to be in grossly good position. Stable
cardiomegaly and central pulmonary vascular congestion is noted.
Stable moderate right pleural effusion with probable underlying
atelectasis or infiltrate.

## 2018-02-15 NOTE — Progress Notes (Deleted)
Cardiology Office Note  Date: 02/15/2018   ID: General, Wearing 1940-12-20, MRN [id number]  PCP: [name], MD  Primary Cardiologist: Nona Dell, MD   No chief complaint on file.   History of Present Illness: Miguel Mendoza is a 77 y.o. male not seen in the office since August 2017.  Past Medical History:  Diagnosis Date  . Anxiety   . CAD (coronary artery disease)    Multivessel status post stent to LAD 1998 then CABG 2003  . Cervical disc disease   . Dyslipidemia   . Essential hypertension   . GERD (gastroesophageal reflux disease)   . History of stroke 50  . Migraine headache   . Myocardial infarction   . Respiratory failure (HCC)   . Type 2 diabetes mellitus (HCC)     Past Surgical History:  Procedure Laterality Date  . ANTERIOR CERVICAL DECOMP/DISCECTOMY FUSION    . BACK SURGERY    . CAROTID ENDARTERECTOMY Left   . CORONARY ARTERY BYPASS GRAFT  2003   Dr. Cornelius Moras - LIMA to LAD, SVG to OM, SVG to RCA    Current Outpatient Medications  Medication Sig Dispense Refill  . albuterol (PROVENTIL HFA;VENTOLIN HFA) 108 (90 Base) MCG/ACT inhaler Inhale 2 puffs into the lungs every 6 (six) hours as needed for wheezing or shortness of breath.    . clonazePAM (KLONOPIN) 1 MG tablet Take 1 mg by mouth daily.    . Cyanocobalamin (VITAMIN B-12 IJ) Inject as directed once a week.    . esomeprazole (NEXIUM) 40 MG capsule Take 40 mg by mouth daily before breakfast.    . furosemide (LASIX) 40 MG tablet Take 1.5 tablets (60 mg total) by mouth daily. 60 tablet 3  . furosemide (LASIX) 40 MG tablet TAKE (1) TABLET BY MOUTH ONCE DAILY. 30 tablet 6  . gabapentin (NEURONTIN) 400 MG capsule Take 400 mg by mouth 3 (three) times daily.    . metFORMIN (GLUCOPHAGE) 500 MG tablet Take 1,000 mg by mouth 2 (two) times daily with a meal.    . nitroGLYCERIN (NITROSTAT) 0.4 MG SL tablet Place 0.4 mg under the tongue every 5 (five) minutes as needed.    . nystatin-triamcinolone  (MYCOLOG II) cream Apply topically 4 (four) times daily. 30 g 0  . oxyCODONE-acetaminophen (PERCOCET) 7.5-325 MG tablet Take 1 tablet by mouth every 4 (four) hours as needed for moderate pain. Max APAP 3gm/24 hrs from all sources 180 tablet 0  . potassium chloride (K-DUR,KLOR-CON) 10 MEQ tablet Take 10 mEq by mouth 2 (two) times daily.    Marland Kitchen tiZANidine (ZANAFLEX) 4 MG capsule Take 4 mg by mouth 3 (three) times daily.    . traZODone (DESYREL) 100 MG tablet Take 100 mg by mouth at bedtime.     No current facility-administered medications for this visit.    Allergies:  Ibuprofen   Social History: The patient  reports that he quit smoking about 2 years ago. His smoking use included cigarettes. He smoked 0.50 packs per day. He has never used smokeless tobacco. He reports that he does not drink alcohol or use drugs.   Family History: The patient's family history includes Heart attack in his brother, father, and mother.   ROS:  Please see the history of present illness. Otherwise, complete review of systems is positive for {NONE DEFAULTED:18576::"none"}.  All other systems are reviewed and negative.   Physical Exam: VS:  There were no vitals taken for this visit., BMI There is no  height or weight on file to calculate BMI.  Wt Readings from Last 3 Encounters:  12/31/15 175 lb (79.4 kg)  12/09/15 181 lb (82.1 kg)  09/03/15 177 lb (80.3 kg)    General: Patient appears comfortable at rest. HEENT: Conjunctiva and lids normal, oropharynx clear with moist mucosa. Neck: Supple, no elevated JVP or carotid bruits, no thyromegaly. Lungs: Clear to auscultation, nonlabored breathing at rest. Cardiac: Regular rate and rhythm, no S3 or significant systolic murmur, no pericardial rub. Abdomen: Soft, nontender, no hepatomegaly, bowel sounds present, no guarding or rebound. Extremities: No pitting edema, distal pulses 2+. Skin: Warm and dry. Musculoskeletal: No kyphosis. Neuropsychiatric: Alert and oriented  x3, affect grossly appropriate.  ECG: I personally reviewed the tracing from 04/29/2015 which showed sinus tachycardia with PAC and nonspecific ST changes.  Recent Labwork:  05/15/2015: ALT 13; AST 17 06/20/2015: Magnesium 1.9 06/22/2015: B Natriuretic Peptide 91.0; BUN 33; Creatinine, Ser 1.61; Hemoglobin 9.5; Platelets 213; Potassium 4.1; Sodium 143  May 2017: BUN 32, creatinine 1.2, potassium 4.7  Other Studies Reviewed Today:  Lexiscan Cardiolite 11/14/2012 [facility]): Nondiagnostic ST segment changes, moderate sized septal scar as well as a region of scar within the inferior wall associated with minimal peri-infarct ischemia, LVEF 56%.  Echocardiogram 04/12/2015: Study Conclusions  - Left ventricle: The cavity size was normal. Systolic function was  normal. The estimated ejection fraction was in the range of 55%  to 60%. Wall motion was normal; there were no regional wall  motion abnormalities. Doppler parameters are consistent with  abnormal left ventricular relaxation (grade 1 diastolic  dysfunction). Mild basal septal hypertrophy. - Ventricular septum: Septal motion showed abnormal function and  dyssynergy. These changes are consistent with intraventricular  conduction delay. - Aortic valve: Moderately to severely calcified annulus.  Trileaflet; mildly thickened, mildly calcified leaflets. There  was mild stenosis. Peak velocity (S): 247 cm/s. Mean gradient  (S): 12 mm Hg. Valve area (Vmean): 1.45 cm^2. - Mitral valve: Calcified annulus. Mildly thickened leaflets . - Left atrium: The atrium was mildly to moderately dilated. - Right ventricle: The cavity size was moderately dilated. Systolic  function was moderately reduced. - Tricuspid valve: There was mild regurgitation. - Systemic veins: Unable to accurately assess respiratory variation  as patient is ventilated.  Impressions:  - When compared to the study report dated 02/08/15, there have been  no  significant changes.  Assessment and Plan:    Current medicines were reviewed with the patient today.  No orders of the defined types were placed in this encounter.   Disposition:  Signed, Jonelle Sidle, MD, [facility] 02/15/2018 8:32 AM    [facility] at [facility] [address] [place], [place], [place] [address] Phone: [phone]; Fax: [phone]

## 2018-02-16 ENCOUNTER — Ambulatory Visit: Payer: Medicare HMO | Admitting: Cardiology

## 2018-04-07 NOTE — Progress Notes (Signed)
Cardiology Office Note  Date: 04/08/2018   ID: Miguel Mendoza, Miguel Mendoza 04/16/1941, MRN [id number]  PCP: [name], MD  Primary Cardiologist: Nona Dell, MD   Chief Complaint  Patient presents with  . Coronary Artery Disease    History of Present Illness: Miguel Mendoza is a 76 y.o. male not seen since August 2017.  He is here today overdue for follow-up with his wife.  My understanding is that he was hospitalized at [facility] in November with apparent heart failure and fluid overload.  I do not have these records.  His diuretic regimen was changed, now on Demadex and Aldactone.  He is not sure how much weight he had gained, but was experiencing gradually progressive leg edema and shortness of breath.   He is on supplemental oxygen.  He still follows regularly with Dr. Sherryll Burger.  We went over his medications which are listed below.  He reports compliance.  We did discuss checking daily weights.  I personally reviewed his ECG today which shows sinus bradycardia with diffuse ST-T wave abnormalities.  He has not had an interval echocardiogram.  Past Medical History:  Diagnosis Date  . Anxiety   . CAD (coronary artery disease)    Multivessel status post stent to LAD 1998 then CABG 2003  . Cervical disc disease   . Dyslipidemia   . Essential hypertension   . GERD (gastroesophageal reflux disease)   . History of stroke 33  . Migraine headache   . Myocardial infarction (HCC)   . Respiratory failure (HCC)   . Type 2 diabetes mellitus (HCC)     Past Surgical History:  Procedure Laterality Date  . ANTERIOR CERVICAL DECOMP/DISCECTOMY FUSION    . BACK SURGERY    . CAROTID ENDARTERECTOMY Left   . CORONARY ARTERY BYPASS GRAFT  2003   Dr. Cornelius Moras - LIMA to LAD, SVG to OM, SVG to RCA    Current Outpatient Medications  Medication Sig Dispense Refill  . albuterol (PROVENTIL HFA;VENTOLIN HFA) 108 (90 Base) MCG/ACT inhaler Inhale 2 puffs into the lungs every 6 (six)  hours as needed for wheezing or shortness of breath.    Marland Kitchen atorvastatin (LIPITOR) 20 MG tablet Take 20 mg by mouth daily.    . baclofen (LIORESAL) 10 MG tablet Take 5 mg by mouth 2 (two) times daily.    . Cyanocobalamin (VITAMIN B-12 IJ) Inject as directed once a week.    . losartan (COZAAR) 25 MG tablet Take 25 mg by mouth daily.    . metFORMIN (GLUCOPHAGE) 500 MG tablet Take by mouth. Take 2 tabs by mouth every morning & 1 tab every evening    . metoprolol tartrate (LOPRESSOR) 100 MG tablet Take 100 mg by mouth. Take 1 tab by mouth every morning & 1/2 tab every evening    . nitroGLYCERIN (NITROSTAT) 0.4 MG SL tablet Place 0.4 mg under the tongue every 5 (five) minutes as needed.    Marland Kitchen omeprazole (PRILOSEC) 20 MG capsule Take 20 mg by mouth daily.    Marland Kitchen oxyCODONE-acetaminophen (PERCOCET) 7.5-325 MG tablet Take 1 tablet by mouth every 4 (four) hours as needed for moderate pain. Max APAP 3gm/24 hrs from all sources 180 tablet 0  . sertraline (ZOLOFT) 50 MG tablet Take 50 mg by mouth daily.    Marland Kitchen spironolactone (ALDACTONE) 25 MG tablet Take 25 mg by mouth daily.    Marland Kitchen tiZANidine (ZANAFLEX) 4 MG capsule Take 4 mg by mouth every 8 (eight) hours.     Marland Kitchen  torsemide (DEMADEX) 20 MG tablet Take 60 mg by mouth 2 (two) times daily.     No current facility-administered medications for this visit.    Allergies:  Ibuprofen   Social History: The patient  reports that he quit smoking about 2 years ago. His smoking use included cigarettes. He smoked 0.50 packs per day. He has never used smokeless tobacco. He reports that he does not drink alcohol or use drugs.   ROS:  Please see the history of present illness. Otherwise, complete review of systems is positive for hearing loss, chronic dyspnea.  All other systems are reviewed and negative.   Physical Exam: VS:  BP 112/66   Pulse (!) 58   Ht 5' 5.5" (1.664 m)   Wt 178 lb 6.4 oz (80.9 kg)   SpO2 95%   BMI 29.24 kg/m , BMI Body mass index is 29.24 kg/m.  Wt  Readings from Last 3 Encounters:  04/08/18 178 lb 6.4 oz (80.9 kg)  12/31/15 175 lb (79.4 kg)  12/09/15 181 lb (82.1 kg)    General: Chronically ill-appearing elderly male wearing oxygen via nasal cannula. HEENT: Conjunctiva and lids normal, oropharynx clear. Neck: Supple, no elevated JVP or carotid bruits, no thyromegaly. Lungs: Clear to auscultation, nonlabored breathing at rest. Cardiac: Regular rate and rhythm, no S3, 2-3/6 systolic murmur, no pericardial rub. Abdomen: Soft, nontender, bowel sounds present. Extremities: Mild lower leg edema, distal pulses 1-2+. Skin: Warm and dry. Musculoskeletal: No kyphosis. Neuropsychiatric: Alert and oriented x3, affect grossly appropriate.  ECG: I personally reviewed the tracing from 04/29/2015 which showed sinus rhythm with R' in lead V1, PAC, nonspecific ST changes.  Recent Labwork:  September 2017: Potassium 4.5, BUN 34, creatinine 1.44, hemoglobin 13.7, platelets 195  Other Studies Reviewed Today:  Echocardiogram 04/12/2015: Study Conclusions  - Left ventricle: The cavity size was normal. Systolic function was  normal. The estimated ejection fraction was in the range of 55%  to 60%. Wall motion was normal; there were no regional wall  motion abnormalities. Doppler parameters are consistent with  abnormal left ventricular relaxation (grade 1 diastolic  dysfunction). Mild basal septal hypertrophy. - Ventricular septum: Septal motion showed abnormal function and  dyssynergy. These changes are consistent with intraventricular  conduction delay. - Aortic valve: Moderately to severely calcified annulus.  Trileaflet; mildly thickened, mildly calcified leaflets. There  was mild stenosis. Peak velocity (S): 247 cm/s. Mean gradient  (S): 12 mm Hg. Valve area (Vmean): 1.45 cm^2. - Mitral valve: Calcified annulus. Mildly thickened leaflets . - Left atrium: The atrium was mildly to moderately dilated. - Right ventricle: The cavity  size was moderately dilated. Systolic  function was moderately reduced. - Tricuspid valve: There was mild regurgitation. - Systemic veins: Unable to accurately assess respiratory variation  as patient is ventilated.  Impressions:  - When compared to the study report dated 02/08/15, there have been  no significant changes.  Assessment and Plan:  1.  Multivessel CAD status post CABG.  He is being managed medically.  Continues on beta-blocker, ARB, and Lipitor.  2.  Chronic diastolic heart failure based on previous work-up, LVEF 55 to 60% as of 2016.  He had a recent hospitalization at [facility], records to be requested.  Tolerating current diuretic regimen.  We discussed checking daily weights.  Follow-up echocardiogram will be obtained.  3.  Mixed hyperlipidemia, on Lipitor with follow-up per Dr. Sherryll BurgerShah.  4.  Essential hypertension, blood pressure is well controlled today.  5.  Mild  aortic stenosis as of 2016.  Follow-up echocardiogram to be obtained.  Current medicines were reviewed with the patient today.   Orders Placed This Encounter  Procedures  . EKG 12-Lead  . ECHOCARDIOGRAM COMPLETE    Disposition: Follow-up in 6 months.  Signed, Jonelle Sidle, MD, [facility] 04/08/2018 3:13 PM    [facility] at [facility] [address] [place], [place], [place] [address] Phone: [phone]; Fax: [phone]

## 2018-04-08 ENCOUNTER — Encounter: Payer: Self-pay | Admitting: *Deleted

## 2018-04-08 ENCOUNTER — Encounter: Payer: Self-pay | Admitting: Cardiology

## 2018-04-08 ENCOUNTER — Ambulatory Visit (INDEPENDENT_AMBULATORY_CARE_PROVIDER_SITE_OTHER): Payer: Medicare HMO | Admitting: Cardiology

## 2018-04-08 VITALS — BP 112/66 | HR 58 | Ht 65.5 in | Wt 178.4 lb

## 2018-04-08 DIAGNOSIS — E782 Mixed hyperlipidemia: Secondary | ICD-10-CM

## 2018-04-08 DIAGNOSIS — I25119 Atherosclerotic heart disease of native coronary artery with unspecified angina pectoris: Secondary | ICD-10-CM | POA: Diagnosis not present

## 2018-04-08 DIAGNOSIS — R011 Cardiac murmur, unspecified: Secondary | ICD-10-CM | POA: Diagnosis not present

## 2018-04-08 DIAGNOSIS — I5032 Chronic diastolic (congestive) heart failure: Secondary | ICD-10-CM | POA: Diagnosis not present

## 2018-04-08 DIAGNOSIS — I350 Nonrheumatic aortic (valve) stenosis: Secondary | ICD-10-CM

## 2018-04-08 DIAGNOSIS — I10 Essential (primary) hypertension: Secondary | ICD-10-CM

## 2018-04-08 NOTE — Patient Instructions (Addendum)

## 2018-04-26 ENCOUNTER — Ambulatory Visit (INDEPENDENT_AMBULATORY_CARE_PROVIDER_SITE_OTHER): Payer: Medicare HMO

## 2018-04-26 ENCOUNTER — Other Ambulatory Visit: Payer: Self-pay

## 2018-04-26 DIAGNOSIS — I25119 Atherosclerotic heart disease of native coronary artery with unspecified angina pectoris: Secondary | ICD-10-CM

## 2018-04-26 DIAGNOSIS — R011 Cardiac murmur, unspecified: Secondary | ICD-10-CM

## 2018-04-28 ENCOUNTER — Telehealth: Payer: Self-pay | Admitting: *Deleted

## 2018-04-28 NOTE — Telephone Encounter (Signed)
-----   Message from Ellsworth Lennox, [place] sent at 04/27/2018  5:40 PM EST ----- Covering for Dr. Diona Browner - Please let the patient know that his repeat echocardiogram showed similar results to imaging obtained at [facility]. EF was 35-40% by their report, at 40% (normal is 55-65%) by repeat imaging with multiple wall motion abnormalities. He is already on Metoprolol, Losartan, and Spironolactone which are used when the EF is reduced and by review of records medical management has been pursued in the setting of known CAD as well. Aortic stenosis remains in a mild to moderate range which will continue to be followed. Please forward a copy of results to [name], MD. Thank you.

## 2018-04-28 NOTE — Telephone Encounter (Signed)
Patient informed and copy sent to PCP. 

## 2018-12-30 ENCOUNTER — Telehealth (INDEPENDENT_AMBULATORY_CARE_PROVIDER_SITE_OTHER): Payer: Medicare HMO | Admitting: Cardiology

## 2018-12-30 ENCOUNTER — Encounter: Payer: Self-pay | Admitting: Cardiology

## 2018-12-30 DIAGNOSIS — I5022 Chronic systolic (congestive) heart failure: Secondary | ICD-10-CM | POA: Diagnosis not present

## 2018-12-30 DIAGNOSIS — I350 Nonrheumatic aortic (valve) stenosis: Secondary | ICD-10-CM

## 2018-12-30 DIAGNOSIS — I10 Essential (primary) hypertension: Secondary | ICD-10-CM

## 2018-12-30 DIAGNOSIS — I255 Ischemic cardiomyopathy: Secondary | ICD-10-CM

## 2018-12-30 DIAGNOSIS — E782 Mixed hyperlipidemia: Secondary | ICD-10-CM

## 2018-12-30 NOTE — Progress Notes (Signed)
Virtual Visit via Telephone Note   This visit type was conducted due to national recommendations for restrictions regarding the COVID-19 Pandemic (e.g. social distancing) in an effort to limit this patient's exposure and mitigate transmission in our community.  Due to his co-morbid illnesses, this patient is at least at moderate risk for complications without adequate follow up.  This format is felt to be most appropriate for this patient at this time.  The patient did not have access to video technology/had technical difficulties with video requiring transitioning to audio format only (telephone).  All issues noted in this document were discussed and addressed.  No physical exam could be performed with this format.  Please refer to the patient's chart for his  consent to telehealth for [facility].   Date:  12/30/2018   ID:  Miguel Mendoza, DOB [birth year], MRN [id number]  Patient Location: Home Provider Location: Office  PCP:  [name], Ashish, MD  Cardiologist:  Nona DellSamuel Antonae Zbikowski, MD Electrophysiologist:  None   Evaluation Performed:  Follow-Up Visit  Chief Complaint:   Cardiac follow-up  History of Present Illness:    Miguel Mendoza is a 78 y.o. male December 2019.  We spoke by phone today.  He tells me that he has been doing well overall, walking some for exercise and also working in his yard when he can.  He does not report any exertional chest pain or nitroglycerin use.  His weight is down compared to last visit, although he does report intermittent leg swelling.  He describes NYHA class II dyspnea, no syncope.  I reviewed his medications which are outlined below.  No change in cardiac regimen.  He is still following with Dr. Sherryll BurgerShah.  His last echocardiogram in December 2019 showed LVEF approximately 40%.  We discussed obtaining a follow-up study to ensure no additional medication changes need to be made.  The patient does not have symptoms concerning for COVID-19 infection (fever,  chills, cough, or new shortness of breath).    Past Medical History:  Diagnosis Date  . Anxiety   . CAD (coronary artery disease)    Multivessel status post stent to LAD 1998 then CABG 2003  . Cervical disc disease   . Dyslipidemia   . Essential hypertension   . GERD (gastroesophageal reflux disease)   . History of stroke 691997  . Migraine headache   . Myocardial infarction (HCC)   . Respiratory failure (HCC)   . Type 2 diabetes mellitus (HCC)    Past Surgical History:  Procedure Laterality Date  . ANTERIOR CERVICAL DECOMP/DISCECTOMY FUSION    . BACK SURGERY    . CAROTID ENDARTERECTOMY Left   . CORONARY ARTERY BYPASS GRAFT  2003   Dr. Cornelius Moraswen - LIMA to LAD, SVG to OM, SVG to RCA     Current Meds  Medication Sig  . albuterol (PROVENTIL HFA;VENTOLIN HFA) 108 (90 Base) MCG/ACT inhaler Inhale 2 puffs into the lungs every 6 (six) hours as needed for wheezing or shortness of breath.  Marland Kitchen. atorvastatin (LIPITOR) 20 MG tablet Take 20 mg by mouth daily.  . baclofen (LIORESAL) 10 MG tablet Take 5 mg by mouth 2 (two) times daily.  . Cyanocobalamin (VITAMIN B-12 IJ) Inject as directed once a week.  . losartan (COZAAR) 25 MG tablet Take 25 mg by mouth daily.  . metFORMIN (GLUCOPHAGE) 500 MG tablet Take by mouth. Take 2 tabs by mouth every morning & 1 tab every evening  . metoprolol tartrate (LOPRESSOR) 100 MG tablet Take  100 mg by mouth. Take 1 tab by mouth every morning & 1/2 tab every evening  . nitroGLYCERIN (NITROSTAT) 0.4 MG SL tablet Place 0.4 mg under the tongue every 5 (five) minutes as needed.  Marland Kitchen omeprazole (PRILOSEC) 20 MG capsule Take 20 mg by mouth daily.  Marland Kitchen oxyCODONE-acetaminophen (PERCOCET) 7.5-325 MG tablet Take 1 tablet by mouth every 4 (four) hours as needed for moderate pain. Max APAP 3gm/24 hrs from all sources  . sertraline (ZOLOFT) 50 MG tablet Take 50 mg by mouth daily.  Marland Kitchen spironolactone (ALDACTONE) 25 MG tablet Take 25 mg by mouth daily.  Marland Kitchen tiZANidine (ZANAFLEX) 4 MG  capsule Take 4 mg by mouth every 8 (eight) hours.   . torsemide (DEMADEX) 20 MG tablet Take 60 mg by mouth 2 (two) times daily.     Allergies:   Ibuprofen   Social History   Tobacco Use  . Smoking status: Current Every Day Smoker    Packs/day: 0.50    Types: Cigarettes    Last attempt to quit: 08/07/2015    Years since quitting: 3.4  . Smokeless tobacco: Never Used  Substance Use Topics  . Alcohol use: No    Alcohol/week: 0.0 standard drinks  . Drug use: No     Family Hx: The patient's family history includes Heart attack in his brother, father, and mother.  ROS:   Please see the history of present illness. All other systems reviewed and are negative.   Prior CV studies:   The following studies were reviewed today:  Echocardiogram 04/26/2018: Study Conclusions  - Left ventricle: The cavity size was mildly dilated. Wall   thickness was normal. Systolic function was moderately reduced.   The estimated ejection fraction was 40%. Diffuse hypokinesis.   Doppler parameters are consistent with abnormal left ventricular   relaxation (grade 1 diastolic dysfunction). Doppler parameters   are consistent with indeterminate ventricular filling pressure. - Regional wall motion abnormality: Akinesis of the mid anterior   and basal-mid anteroseptal myocardium; severe hypokinesis of the   apical anterior myocardium; moderate hypokinesis of the mid   anterolateral and apical myocardium. - Aortic valve: Moderately to severely calcified annulus. Mildly   thickened, mildly calcified leaflets. There was mild to moderate   stenosis. Valve area (VTI): 1.17 cm^2. Valve area (Vmax): 1.02   cm^2. Valve area (Vmean): 1.09 cm^2. - Aorta: Aortic root dimension: 37 mm (ED). - Mitral valve: Mildly calcified annulus. There was mild   regurgitation. - Right ventricle: The cavity size was mildly dilated. Systolic   function was mildly reduced. - Tricuspid valve: There was mild regurgitation.   Impressions:  - No significant changes in LVEF when compared to echocardiogram   report dated 02/23/18.  Labs/Other Tests and Data Reviewed:    EKG:  An ECG dated 04/08/2018 was personally reviewed today and demonstrated:  Sinus bradycardia with diffuse ST-T wave changes.  Recent Labs:  October 2019: BUN 26, creatinine 1.26, potassium 4.2, AST 12, ALT 8, troponin T negative, hemoglobin 10.8, platelets 190  Wt Readings from Last 3 Encounters:  12/30/18 165 lb (74.8 kg)  04/08/18 178 lb 6.4 oz (80.9 kg)  12/31/15 175 lb (79.4 kg)     Objective:    Vital Signs:  BP 130/82   Pulse 87   Ht 5' 5.5" (1.664 m)   Wt 165 lb (74.8 kg)   BMI 27.04 kg/m    Patient spoke in full sentences, not short of breath. No audible wheezing or coughing. Speech pattern normal.  ASSESSMENT & PLAN:    1.  Ischemic cardiomyopathy, LVEF 40% by last evaluation.  He is currently on metoprolol, Cozaar, aspirin, Lipitor, and Aldactone.  Follow-up echocardiogram will be obtained.  2.  Chronic systolic heart failure.  Weight is down over 10 pounds from last visit.  He is on stable dose of Demadex with intermittent leg swelling.  3.  Hyperlipidemia on Lipitor with follow-up by Dr. Sherryll Burger.  4.  Essential hypertension, blood pressure control is adequate today.  5.  Aortic stenosis, mild to moderate range by echocardiogram in December 2019.  COVID-19 Education: The signs and symptoms of COVID-19 were discussed with the patient and how to seek care for testing (follow up with PCP or arrange E-visit).  The importance of social distancing was discussed today.  Time:   Today, I have spent 6 minutes with the patient with telehealth technology discussing the above problems.     Medication Adjustments/Labs and Tests Ordered: Current medicines are reviewed at length with the patient today.  Concerns regarding medicines are outlined above.   Tests Ordered: Orders Placed This Encounter  Procedures  .  ECHOCARDIOGRAM COMPLETE    Medication Changes: No orders of the defined types were placed in this encounter.   Follow Up:  In Person 3 months in the [place] office.  Signed, Nona Dell, MD  12/30/2018 9:36 AM    [facility]

## 2018-12-30 NOTE — Patient Instructions (Signed)
Your physician recommends that you schedule a follow-up appointment in: 3 MONTHS WITH DR. MCDOWELL  Your physician recommends that you continue on your current medications as directed. Please refer to the Current Medication list given to you today.  Your physician has requested that you have an echocardiogram. Echocardiography is a painless test that uses sound waves to create images of your heart. It provides your doctor with information about the size and shape of your heart and how well your heart's chambers and valves are working. This procedure takes approximately one hour. There are no restrictions for this procedure.  Thank you for choosing [facility]!!    

## 2019-02-01 ENCOUNTER — Other Ambulatory Visit: Payer: Medicare HMO

## 2019-02-02 ENCOUNTER — Emergency Department (HOSPITAL_COMMUNITY): Payer: Medicare HMO

## 2019-02-02 ENCOUNTER — Inpatient Hospital Stay (HOSPITAL_COMMUNITY)
Admission: EM | Admit: 2019-02-02 | Discharge: 2019-02-05 | DRG: 291 | Disposition: A | Discharge status: Home / Self Care | Payer: Medicare HMO | Attending: Internal Medicine | Admitting: Internal Medicine

## 2019-02-02 ENCOUNTER — Other Ambulatory Visit: Payer: Self-pay

## 2019-02-02 ENCOUNTER — Encounter (HOSPITAL_COMMUNITY): Payer: Self-pay

## 2019-02-02 DIAGNOSIS — M5090 Cervical disc disorder, unspecified, unspecified cervical region: Secondary | ICD-10-CM | POA: Diagnosis present

## 2019-02-02 DIAGNOSIS — G8929 Other chronic pain: Secondary | ICD-10-CM | POA: Diagnosis present

## 2019-02-02 DIAGNOSIS — I361 Nonrheumatic tricuspid (valve) insufficiency: Secondary | ICD-10-CM | POA: Diagnosis not present

## 2019-02-02 DIAGNOSIS — J9621 Acute and chronic respiratory failure with hypoxia: Secondary | ICD-10-CM

## 2019-02-02 DIAGNOSIS — Z8249 Family history of ischemic heart disease and other diseases of the circulatory system: Secondary | ICD-10-CM | POA: Diagnosis not present

## 2019-02-02 DIAGNOSIS — F329 Major depressive disorder, single episode, unspecified: Secondary | ICD-10-CM | POA: Diagnosis present

## 2019-02-02 DIAGNOSIS — R609 Edema, unspecified: Secondary | ICD-10-CM | POA: Diagnosis present

## 2019-02-02 DIAGNOSIS — E785 Hyperlipidemia, unspecified: Secondary | ICD-10-CM | POA: Diagnosis present

## 2019-02-02 DIAGNOSIS — I130 Hypertensive heart and chronic kidney disease with heart failure and stage 1 through stage 4 chronic kidney disease, or unspecified chronic kidney disease: Principal | ICD-10-CM | POA: Diagnosis present

## 2019-02-02 DIAGNOSIS — I252 Old myocardial infarction: Secondary | ICD-10-CM | POA: Diagnosis not present

## 2019-02-02 DIAGNOSIS — Z8673 Personal history of transient ischemic attack (TIA), and cerebral infarction without residual deficits: Secondary | ICD-10-CM | POA: Diagnosis not present

## 2019-02-02 DIAGNOSIS — K219 Gastro-esophageal reflux disease without esophagitis: Secondary | ICD-10-CM | POA: Diagnosis present

## 2019-02-02 DIAGNOSIS — I10 Essential (primary) hypertension: Secondary | ICD-10-CM | POA: Diagnosis not present

## 2019-02-02 DIAGNOSIS — F419 Anxiety disorder, unspecified: Secondary | ICD-10-CM | POA: Diagnosis present

## 2019-02-02 DIAGNOSIS — I2510 Atherosclerotic heart disease of native coronary artery without angina pectoris: Secondary | ICD-10-CM | POA: Diagnosis present

## 2019-02-02 DIAGNOSIS — T500X5A Adverse effect of mineralocorticoids and their antagonists, initial encounter: Secondary | ICD-10-CM | POA: Diagnosis not present

## 2019-02-02 DIAGNOSIS — N183 Chronic kidney disease, stage 3 unspecified: Secondary | ICD-10-CM | POA: Diagnosis present

## 2019-02-02 DIAGNOSIS — E7800 Pure hypercholesterolemia, unspecified: Secondary | ICD-10-CM | POA: Diagnosis present

## 2019-02-02 DIAGNOSIS — E1122 Type 2 diabetes mellitus with diabetic chronic kidney disease: Secondary | ICD-10-CM | POA: Diagnosis present

## 2019-02-02 DIAGNOSIS — M549 Dorsalgia, unspecified: Secondary | ICD-10-CM | POA: Diagnosis present

## 2019-02-02 DIAGNOSIS — Z955 Presence of coronary angioplasty implant and graft: Secondary | ICD-10-CM

## 2019-02-02 DIAGNOSIS — Z9981 Dependence on supplemental oxygen: Secondary | ICD-10-CM

## 2019-02-02 DIAGNOSIS — J441 Chronic obstructive pulmonary disease with (acute) exacerbation: Secondary | ICD-10-CM | POA: Diagnosis present

## 2019-02-02 DIAGNOSIS — I350 Nonrheumatic aortic (valve) stenosis: Secondary | ICD-10-CM | POA: Diagnosis not present

## 2019-02-02 DIAGNOSIS — D631 Anemia in chronic kidney disease: Secondary | ICD-10-CM | POA: Diagnosis present

## 2019-02-02 DIAGNOSIS — I5043 Acute on chronic combined systolic (congestive) and diastolic (congestive) heart failure: Secondary | ICD-10-CM

## 2019-02-02 DIAGNOSIS — Z20828 Contact with and (suspected) exposure to other viral communicable diseases: Secondary | ICD-10-CM | POA: Diagnosis present

## 2019-02-02 DIAGNOSIS — Z951 Presence of aortocoronary bypass graft: Secondary | ICD-10-CM | POA: Diagnosis not present

## 2019-02-02 DIAGNOSIS — I5023 Acute on chronic systolic (congestive) heart failure: Secondary | ICD-10-CM

## 2019-02-02 DIAGNOSIS — G43909 Migraine, unspecified, not intractable, without status migrainosus: Secondary | ICD-10-CM | POA: Diagnosis present

## 2019-02-02 DIAGNOSIS — E875 Hyperkalemia: Secondary | ICD-10-CM | POA: Diagnosis not present

## 2019-02-02 DIAGNOSIS — F17210 Nicotine dependence, cigarettes, uncomplicated: Secondary | ICD-10-CM | POA: Diagnosis present

## 2019-02-02 DIAGNOSIS — J9620 Acute and chronic respiratory failure, unspecified whether with hypoxia or hypercapnia: Secondary | ICD-10-CM | POA: Diagnosis present

## 2019-02-02 LAB — CBC WITH DIFFERENTIAL/PLATELET
Abs Immature Granulocytes: 0.01 10*3/uL (ref 0.00–0.07)
Basophils Absolute: 0 10*3/uL (ref 0.0–0.1)
Basophils Relative: 0 %
Eosinophils Absolute: 0.2 10*3/uL (ref 0.0–0.5)
Eosinophils Relative: 3 %
HCT: 39.3 % (ref 39.0–52.0)
Hemoglobin: 10.3 g/dL — ABNORMAL LOW (ref 13.0–17.0)
Immature Granulocytes: 0 %
Lymphocytes Relative: 13 %
Lymphs Abs: 0.9 10*3/uL (ref 0.7–4.0)
MCH: 26.9 pg (ref 26.0–34.0)
MCHC: 26.2 g/dL — ABNORMAL LOW (ref 30.0–36.0)
MCV: 102.6 fL — ABNORMAL HIGH (ref 80.0–100.0)
Monocytes Absolute: 0.6 10*3/uL (ref 0.1–1.0)
Monocytes Relative: 9 %
Neutro Abs: 5.1 10*3/uL (ref 1.7–7.7)
Neutrophils Relative %: 75 %
Platelets: 238 10*3/uL (ref 150–400)
RBC: 3.83 MIL/uL — ABNORMAL LOW (ref 4.22–5.81)
RDW: 16.4 % — ABNORMAL HIGH (ref 11.5–15.5)
WBC: 6.9 10*3/uL (ref 4.0–10.5)
nRBC: 0 % (ref 0.0–0.2)

## 2019-02-02 LAB — COMPREHENSIVE METABOLIC PANEL
ALT: 12 U/L (ref 0–44)
AST: 16 U/L (ref 15–41)
Albumin: 3.4 g/dL — ABNORMAL LOW (ref 3.5–5.0)
Alkaline Phosphatase: 70 U/L (ref 38–126)
Anion gap: 12 (ref 5–15)
BUN: 44 mg/dL — ABNORMAL HIGH (ref 8–23)
CO2: 32 mmol/L (ref 22–32)
Calcium: 9.2 mg/dL (ref 8.9–10.3)
Chloride: 97 mmol/L — ABNORMAL LOW (ref 98–111)
Creatinine, Ser: 1.74 mg/dL — ABNORMAL HIGH (ref 0.61–1.24)
GFR calc Af Amer: 43 mL/min — ABNORMAL LOW (ref >60)
GFR calc non Af Amer: 37 mL/min — ABNORMAL LOW (ref >60)
Glucose, Bld: 110 mg/dL — ABNORMAL HIGH (ref 70–99)
Potassium: 5 mmol/L (ref 3.5–5.1)
Sodium: 141 mmol/L (ref 135–145)
Total Bilirubin: 0.4 mg/dL (ref 0.3–1.2)
Total Protein: 7.7 g/dL (ref 6.5–8.1)

## 2019-02-02 LAB — GLUCOSE, CAPILLARY
Glucose-Capillary: 101 mg/dL — ABNORMAL HIGH (ref 70–99)
Glucose-Capillary: 177 mg/dL — ABNORMAL HIGH (ref 70–99)

## 2019-02-02 LAB — IRON AND TIBC
Iron: 35 ug/dL — ABNORMAL LOW (ref 45–182)
Saturation Ratios: 7 % — ABNORMAL LOW (ref 17.9–39.5)
TIBC: 523 ug/dL — ABNORMAL HIGH (ref 250–450)
UIBC: 488 ug/dL

## 2019-02-02 LAB — TROPONIN I (HIGH SENSITIVITY)
Troponin I (High Sensitivity): 16 ng/L (ref <18)
Troponin I (High Sensitivity): 14 ng/L (ref <18)

## 2019-02-02 LAB — BRAIN NATRIURETIC PEPTIDE: B Natriuretic Peptide: 1117 pg/mL — ABNORMAL HIGH (ref 0.0–100.0)

## 2019-02-02 LAB — FOLATE: Folate: 10.7 ng/mL (ref >5.9)

## 2019-02-02 LAB — SARS CORONAVIRUS 2 BY RT PCR (HOSPITAL ORDER, PERFORMED IN ~~LOC~~ HOSPITAL LAB): SARS Coronavirus 2: NEGATIVE

## 2019-02-02 LAB — FERRITIN: Ferritin: 17 ng/mL — ABNORMAL LOW (ref 24–336)

## 2019-02-02 LAB — VITAMIN B12: Vitamin B-12: 1880 pg/mL — ABNORMAL HIGH (ref 180–914)

## 2019-02-02 MED ORDER — ATORVASTATIN CALCIUM 20 MG PO TABS
20.0000 mg | ORAL_TABLET | Freq: Every day | ORAL | Status: DC
  Administered 2019-02-02 – 2019-02-05 (×4): 20 mg via ORAL
  Filled 2019-02-02 (×5): qty 1

## 2019-02-02 MED ORDER — INSULIN ASPART 100 UNIT/ML ~~LOC~~ SOLN
0.0000 [IU] | Freq: Three times a day (TID) | SUBCUTANEOUS | Status: DC
  Administered 2019-02-03 – 2019-02-04 (×4): 2 [IU] via SUBCUTANEOUS
  Administered 2019-02-04: 1 [IU] via SUBCUTANEOUS
  Administered 2019-02-04: 3 [IU] via SUBCUTANEOUS
  Administered 2019-02-05: 2 [IU] via SUBCUTANEOUS
  Administered 2019-02-05: 3 [IU] via SUBCUTANEOUS

## 2019-02-02 MED ORDER — IPRATROPIUM-ALBUTEROL 0.5-2.5 (3) MG/3ML IN SOLN
3.0000 mL | Freq: Four times a day (QID) | RESPIRATORY_TRACT | Status: DC
  Administered 2019-02-02 – 2019-02-05 (×12): 3 mL via RESPIRATORY_TRACT
  Filled 2019-02-02 (×12): qty 3

## 2019-02-02 MED ORDER — ONDANSETRON HCL 4 MG/2ML IJ SOLN: 4.0000 mg | Freq: Four times a day (QID) | INTRAMUSCULAR | Status: DC | PRN

## 2019-02-02 MED ORDER — BUDESONIDE 0.5 MG/2ML IN SUSP
0.5000 mg | Freq: Two times a day (BID) | RESPIRATORY_TRACT | Status: DC
  Administered 2019-02-02 – 2019-02-05 (×6): 0.5 mg via RESPIRATORY_TRACT
  Filled 2019-02-02 (×6): qty 2

## 2019-02-02 MED ORDER — METHYLPREDNISOLONE SODIUM SUCC 125 MG IJ SOLR
60.0000 mg | Freq: Four times a day (QID) | INTRAMUSCULAR | Status: DC
  Administered 2019-02-02 – 2019-02-05 (×11): 60 mg via INTRAVENOUS
  Filled 2019-02-02 (×11): qty 2

## 2019-02-02 MED ORDER — LOSARTAN POTASSIUM 50 MG PO TABS
25.0000 mg | ORAL_TABLET | Freq: Every day | ORAL | Status: DC
  Administered 2019-02-02: 25 mg via ORAL
  Filled 2019-02-02 (×2): qty 1

## 2019-02-02 MED ORDER — SODIUM CHLORIDE 0.9% FLUSH
3.0000 mL | Freq: Two times a day (BID) | INTRAVENOUS | Status: DC
  Administered 2019-02-02 – 2019-02-05 (×6): 3 mL via INTRAVENOUS

## 2019-02-02 MED ORDER — FUROSEMIDE 10 MG/ML IJ SOLN
80.0000 mg | Freq: Two times a day (BID) | INTRAMUSCULAR | Status: AC
  Administered 2019-02-02 – 2019-02-04 (×5): 80 mg via INTRAVENOUS
  Filled 2019-02-02 (×5): qty 8

## 2019-02-02 MED ORDER — IPRATROPIUM-ALBUTEROL 0.5-2.5 (3) MG/3ML IN SOLN: 3.0000 mL | Freq: Once | RESPIRATORY_TRACT | Status: DC

## 2019-02-02 MED ORDER — ACETAMINOPHEN 325 MG PO TABS
650.0000 mg | ORAL_TABLET | ORAL | Status: DC | PRN
  Administered 2019-02-04: 650 mg via ORAL
  Filled 2019-02-02: qty 2

## 2019-02-02 MED ORDER — SERTRALINE HCL 50 MG PO TABS
50.0000 mg | ORAL_TABLET | Freq: Every day | ORAL | Status: DC
  Administered 2019-02-02 – 2019-02-05 (×4): 50 mg via ORAL
  Filled 2019-02-02 (×5): qty 1

## 2019-02-02 MED ORDER — HEPARIN SODIUM (PORCINE) 5000 UNIT/ML IJ SOLN
5000.0000 [IU] | Freq: Three times a day (TID) | INTRAMUSCULAR | Status: DC
  Administered 2019-02-02 – 2019-02-05 (×9): 5000 [IU] via SUBCUTANEOUS
  Filled 2019-02-02 (×8): qty 1

## 2019-02-02 MED ORDER — SODIUM CHLORIDE 0.9 % IV SOLN: 250.0000 mL | INTRAVENOUS | Status: DC | PRN

## 2019-02-02 MED ORDER — PANTOPRAZOLE SODIUM 40 MG PO TBEC
40.0000 mg | DELAYED_RELEASE_TABLET | Freq: Every day | ORAL | Status: DC
  Administered 2019-02-02 – 2019-02-05 (×4): 40 mg via ORAL
  Filled 2019-02-02 (×5): qty 1

## 2019-02-02 MED ORDER — METOPROLOL TARTRATE 25 MG PO TABS
25.0000 mg | ORAL_TABLET | Freq: Two times a day (BID) | ORAL | Status: DC
  Administered 2019-02-02 – 2019-02-05 (×6): 25 mg via ORAL
  Filled 2019-02-02 (×6): qty 1

## 2019-02-02 MED ORDER — SODIUM CHLORIDE 0.9% FLUSH: 3.0000 mL | INTRAVENOUS | Status: DC | PRN

## 2019-02-02 MED ORDER — OXYCODONE-ACETAMINOPHEN 7.5-325 MG PO TABS
1.0000 | ORAL_TABLET | Freq: Four times a day (QID) | ORAL | Status: DC | PRN
  Administered 2019-02-02 – 2019-02-05 (×7): 1 via ORAL
  Filled 2019-02-02 (×8): qty 1

## 2019-02-02 MED ORDER — FUROSEMIDE 10 MG/ML IJ SOLN
80.0000 mg | Freq: Once | INTRAMUSCULAR | Status: AC
  Administered 2019-02-02: 80 mg via INTRAVENOUS
  Filled 2019-02-02: qty 8

## 2019-02-02 MED ORDER — METHYLPREDNISOLONE SODIUM SUCC 125 MG IJ SOLR
125.0000 mg | Freq: Once | INTRAMUSCULAR | Status: AC
  Administered 2019-02-02: 125 mg via INTRAVENOUS
  Filled 2019-02-02: qty 2

## 2019-02-02 MED ORDER — OXYCODONE HCL 5 MG PO TABS
10.0000 mg | ORAL_TABLET | Freq: Once | ORAL | Status: AC
  Administered 2019-02-02: 10 mg via ORAL
  Filled 2019-02-02: qty 2

## 2019-02-02 MED ORDER — SPIRONOLACTONE 25 MG PO TABS
25.0000 mg | ORAL_TABLET | Freq: Every day | ORAL | Status: DC
  Administered 2019-02-02: 25 mg via ORAL
  Filled 2019-02-02 (×2): qty 1

## 2019-02-02 NOTE — ED Triage Notes (Signed)
Pt sent here by Dr. Manuella Ghazi for evaluation of fluid buildup in legs. Per doctor, believes he has CHF. Swelling in legs for the last few months

## 2019-02-02 NOTE — ED Notes (Signed)
Patient rode from [place] to hospital without his oxygen on.  Patient is usually on 2 lpm of oxygen at all times. Per Dr. Laverta Baltimore patient's o2 was increased to 4 lpm and his o2 sat increased to 95%

## 2019-02-02 NOTE — ED Notes (Signed)
ED TO INPATIENT HANDOFF REPORT  ED Nurse Name and Phone #: Collier FlowersBryan  S Name/Age/Gender Miguel Mendoza 78 y.o. male Room/Bed: APA07/APA07  Code Status   Code Status: Prior  Home/SNF/Other Home Patient oriented to: self, place, time and situation Is this baseline? Yes   Triage Complete: Triage complete  Chief Complaint fluid buildup  Triage Note Pt sent here by Dr. Sherryll BurgerShah for evaluation of fluid buildup in legs. Per doctor, believes he has CHF. Swelling in legs for the last few months    Allergies Allergies  Allergen Reactions  . Ibuprofen Hives    Level of Care/Admitting Diagnosis ED Disposition    ED Disposition Condition Comment   Admit  [facility] [[id number]]  Level of Care: Telemetry [5]  Covid Evaluation: Confirmed COVID Negative  Diagnosis: Acute on chronic respiratory failure [facility](HCC) [[id number]]) [[id number]]  Admitting Physician: TAT, DAVID [email or web address][[id number]]  Attending Physician: TAT, DAVID [[id number]]  Estimated length of stay: past midnight tomorrow  Certification:: I certify this patient will need inpatient services for at least 2 midnights  PT Class (Do Not Modify): Inpatient [101]  PT Acc Code (Do Not Modify): Private [1]       B Medical/Surgery History Past Medical History:  Diagnosis Date  . Anxiety   . CAD (coronary artery disease)    Multivessel status post stent to LAD 1998 then CABG 2003  . Cervical disc disease   . Dyslipidemia   . Essential hypertension   . GERD (gastroesophageal reflux disease)   . History of stroke 561997  . Migraine headache   . Myocardial infarction (HCC)   . Respiratory failure (HCC)   . Type 2 diabetes mellitus (HCC)    Past Surgical History:  Procedure Laterality Date  . ANTERIOR CERVICAL DECOMP/DISCECTOMY FUSION    . BACK SURGERY    . CAROTID ENDARTERECTOMY Left   . CORONARY ARTERY BYPASS GRAFT  2003   Dr. Cornelius Moraswen - LIMA to LAD, SVG to OM, SVG to RCA     A IV Location/Drains/Wounds Patient Lines/Drains/Airways Status    Active Line/Drains/Airways    Name:   Placement date:   Placement time:   Site:   Days:   Peripheral IV 02/02/19 Right Antecubital   02/02/19    0926    Antecubital   less than 1   Rectal Tube/Pouch   04/21/15    2100    -   1383   Urethral Catheter T. Gauldin nT Straight-tip 14 Fr.   04/24/15    1945    Straight-tip   1380   Pressure Ulcer 04/29/15 Stage II -  Partial thickness loss of dermis presenting as a shallow open ulcer with a red, pink wound bed without slough. Stage 1 to sacrum with several stg 2 ulcerations within that arae   04/29/15    1900     1375          Intake/Output Last 24 hours  Intake/Output Summary (Last 24 hours) at 02/02/2019 1516 Last data filed at 02/02/2019 1503 Gross per 24 hour  Intake -  Output 850 ml  Net -850 ml    Labs/Imaging Results for orders placed or performed during the hospital encounter of 02/02/19 (from the past 48 hour(s))  Comprehensive metabolic panel     Status: Abnormal   Collection Time: 02/02/19  9:15 AM  Result Value Ref Range   Sodium 141 135 - 145 mmol/L   Potassium 5.0 3.5 - 5.1 mmol/L   Chloride 97 (L) 98 -  111 mmol/L   CO2 32 22 - 32 mmol/L   Glucose, Bld 110 (H) 70 - 99 mg/dL   BUN 44 (H) 8 - 23 mg/dL   Creatinine, Ser 1.61 (H) 0.61 - 1.24 mg/dL   Calcium 9.2 8.9 - 09.6 mg/dL   Total Protein 7.7 6.5 - 8.1 g/dL   Albumin 3.4 (L) 3.5 - 5.0 g/dL   AST 16 15 - 41 U/L   ALT 12 0 - 44 U/L   Alkaline Phosphatase 70 38 - 126 U/L   Total Bilirubin 0.4 0.3 - 1.2 mg/dL   GFR calc non Af Amer 37 (L) >60 mL/min   GFR calc Af Amer 43 (L) >60 mL/min   Anion gap 12 5 - 15    Comment: Performed at [facility], [address] [place], [place] [address]  Brain natriuretic peptide     Status: Abnormal   Collection Time: 02/02/19  9:15 AM  Result Value Ref Range   B Natriuretic Peptide 1,117.0 (H) 0.0 - 100.0 pg/mL    Comment: Performed at [facility], [address] [place], [place] [address]  Troponin I (High Sensitivity)      Status: None   Collection Time: 02/02/19  9:15 AM  Result Value Ref Range   Troponin I (High Sensitivity) 16 <18 ng/L    Comment: (NOTE) Elevated high sensitivity troponin I (hsTnI) values and significant  changes across serial measurements may suggest ACS but many other  chronic and acute conditions are known to elevate hsTnI results.  Refer to the Links section for chest pain algorithms and additional  guidance. Performed at [facility], [address], [place], [place] [address]   CBC with Differential     Status: Abnormal   Collection Time: 02/02/19  9:15 AM  Result Value Ref Range   WBC 6.9 4.0 - 10.5 K/uL   RBC 3.83 (L) 4.22 - 5.81 MIL/uL   Hemoglobin 10.3 (L) 13.0 - 17.0 g/dL   HCT 95.6 21.3 - 08.6 %   MCV 102.6 (H) 80.0 - 100.0 fL   MCH 26.9 26.0 - 34.0 pg   MCHC 26.2 (L) 30.0 - 36.0 g/dL   RDW 57.8 (H) 46.9 - 62.9 %   Platelets 238 150 - 400 K/uL   nRBC 0.0 0.0 - 0.2 %   Neutrophils Relative % 75 %   Neutro Abs 5.1 1.7 - 7.7 K/uL   Lymphocytes Relative 13 %   Lymphs Abs 0.9 0.7 - 4.0 K/uL   Monocytes Relative 9 %   Monocytes Absolute 0.6 0.1 - 1.0 K/uL   Eosinophils Relative 3 %   Eosinophils Absolute 0.2 0.0 - 0.5 K/uL   Basophils Relative 0 %   Basophils Absolute 0.0 0.0 - 0.1 K/uL   Immature Granulocytes 0 %   Abs Immature Granulocytes 0.01 0.00 - 0.07 K/uL    Comment: Performed at [facility], [address], [place], [place] [address]  SARS Coronavirus 2 [facility] order, Performed in [facility] hospital lab) Nasopharyngeal Nasopharyngeal Swab     Status: None   Collection Time: 02/02/19  9:16 AM   Specimen: Nasopharyngeal Swab  Result Value Ref Range   SARS Coronavirus 2 NEGATIVE NEGATIVE    Comment: (NOTE) If result is NEGATIVE SARS-CoV-2 target nucleic acids are NOT DETECTED. The SARS-CoV-2 RNA is generally detectable in upper and lower  respiratory specimens during the acute phase of infection. The lowest  concentration of SARS-CoV-2 viral copies this  assay can detect is 250  copies / mL. A negative result does not preclude SARS-CoV-2 infection  and should not be used as the sole basis for treatment or other  patient management decisions.  A negative result may occur with  improper specimen collection / handling, submission of specimen other  than nasopharyngeal swab, presence of viral mutation(s) within the  areas targeted by this assay, and inadequate number of viral copies  (<250 copies / mL). A negative result must be combined with clinical  observations, patient history, and epidemiological information. If result is POSITIVE SARS-CoV-2 target nucleic acids are DETECTED. The SARS-CoV-2 RNA is generally detectable in upper and lower  respiratory specimens dur ing the acute phase of infection.  Positive  results are indicative of active infection with SARS-CoV-2.  Clinical  correlation with patient history and other diagnostic information is  necessary to determine patient infection status.  Positive results do  not rule out bacterial infection or co-infection with other viruses. If result is PRESUMPTIVE POSTIVE SARS-CoV-2 nucleic acids MAY BE PRESENT.   A presumptive positive result was obtained on the submitted specimen  and confirmed on repeat testing.  While 2019 novel coronavirus  (SARS-CoV-2) nucleic acids may be present in the submitted sample  additional confirmatory testing may be necessary for epidemiological  and / or clinical management purposes  to differentiate between  SARS-CoV-2 and other Sarbecovirus currently known to infect humans.  If clinically indicated additional testing with an alternate test  methodology [phone]) is advised. The SARS-CoV-2 RNA is generally  detectable in upper and lower respiratory sp ecimens during the acute  phase of infection. The expected result is Negative. Fact Sheet for Patients:  BoilerBrush.com.cy Fact Sheet for Healthcare  Providers: https://pope.com/ This test is not yet approved or cleared by the [place] FDA and has been authorized for detection and/or diagnosis of SARS-CoV-2 by FDA under an Emergency Use Authorization (EUA).  This EUA will remain in effect (meaning this test can be used) for the duration of the COVID-19 declaration under Section 564(b)(1) of the Act, 21 U.S.C. section 360bbb-3(b)(1), unless the authorization is terminated or revoked sooner. Performed at [facility], [address] [place], [place] [address]   Troponin I (High Sensitivity)     Status: None   Collection Time: 02/02/19 11:02 AM  Result Value Ref Range   Troponin I (High Sensitivity) 14 <18 ng/L    Comment: (NOTE) Elevated high sensitivity troponin I (hsTnI) values and significant  changes across serial measurements may suggest ACS but many other  chronic and acute conditions are known to elevate hsTnI results.  Refer to the "Links" section for chest pain algorithms and additional  guidance. Performed at [facility], [address] [place], [place] [address]    Dg Chest Portable 1 View  Result Date: 02/02/2019 CLINICAL DATA:  Shortness of breath. EXAM: PORTABLE CHEST 1 VIEW COMPARISON:  01/31/2019. FINDINGS: Prior CABG changes. Stable cardiomediastinal contours. Calcific aortic knob. Mild pulmonary vascular congestion. Small bilateral pleural effusions, left greater than right with associated bibasilar opacities. Findings similar to prior. No pneumothorax. IMPRESSION: 1. Small bilateral effusions, left greater than right, with associated bibasilar opacities, likely atelectasis. 2. Findings suggesting fluid overload/mild CHF. Findings are similar to prior. Electronically Signed   By: Duanne Guess M.D.   On: 02/02/2019 09:59    Pending Labs [employer] (From admission, onward)    Start     Ordered   Signed and Held  Basic metabolic panel  Daily,   R  Signed and Held   Signed and  Held  Vitamin B12  Once,   R     Signed and Held   Signed and Held  Folate  Once,   R     Signed and Held   Signed and Held  Ferritin  Once,   R     Signed and Held   Signed and Held  Iron and TIBC  Once,   R     Signed and Held   Signed and Held  Hemoglobin A1c  Once,   R     Signed and Held          Vitals/Pain Today's Vitals   02/02/19 1300 02/02/19 1330 02/02/19 1430 02/02/19 1500  BP: 123/64 (!) 103/47 (!) 112/54 133/82  Pulse: 70 (!) 52 (!) 51 65  Resp: (!) 23     Temp:      TempSrc:      SpO2: 100% 93% 92% 91%  Weight:      Height:      PainSc:        Isolation Precautions No active isolations  Medications Medications  ipratropium-albuterol (DUONEB) 0.5-2.5 (3) MG/3ML nebulizer solution 3 mL (has no administration in time range)  oxyCODONE (Oxy IR/ROXICODONE) immediate release tablet 10 mg (10 mg Oral Given 02/02/19 0935)  furosemide (LASIX) injection 80 mg (80 mg Intravenous Given 02/02/19 1122)  methylPREDNISolone sodium succinate (SOLU-MEDROL) 125 mg/2 mL injection 125 mg (125 mg Intravenous Given 02/02/19 1130)    Mobility walks with person assist Moderate fall risk   Focused Assessments Pulmonary Assessment Handoff:  Lung sounds: Bilateral Breath Sounds: Fine crackles, Diminished L Breath Sounds: Fine crackles, Diminished R Breath Sounds: Diminished, Fine crackles O2 Device: Nasal Cannula O2 Flow Rate (L/min): 3 L/min      R Recommendations: See Admitting Provider Note  Report given to:   Additional Notes:

## 2019-02-02 NOTE — ED Provider Notes (Signed)
Emergency Department Provider Note   I have reviewed the triage vital signs and the nursing notes.   HISTORY  Chief Complaint Leg Swelling   HPI Miguel Mendoza is a 78 y.o. male with PMH of CAD, CHF (EF 40%), HTN, and DM with home O2 requirement of 2L presents to the emergency department for evaluation of increased shortness of breath, PND, orthopnea, and worsening LE edema. Patient followed by Dr. Sherryll Burger who increased patient's lasix now to 100mg  daily.  He has not noticed much improvement in symptoms.  Patient called his PCP office and was encouraged to present to the emergency department yesterday but wife was able to convince him to come in today.  He denies any fevers, cough, sore throat, headache.    Past Medical History:  Diagnosis Date  . Anxiety   . CAD (coronary artery disease)    Multivessel status post stent to LAD 1998 then CABG 2003  . Cervical disc disease   . Dyslipidemia   . Essential hypertension   . GERD (gastroesophageal reflux disease)   . History of stroke 45  . Migraine headache   . Myocardial infarction (HCC)   . Respiratory failure (HCC)   . Type 2 diabetes mellitus [facility])     Patient Active Problem List   Diagnosis Date Noted  . Edema 06/18/2015  . Hyperkalemia 06/04/2015  . Generalized anxiety disorder 06/04/2015  . Anemia due to other cause   . Paroxysmal ventricular tachycardia (HCC) 05/14/2015  . FTT (failure to thrive) in adult 05/11/2015  . Rash and nonspecific skin eruption 05/10/2015  . Urinary retention 05/10/2015  . Protein-calorie malnutrition, severe 05/02/2015  . Pressure ulcer 04/30/2015  . Palliative care encounter   . DNR (do not resuscitate) discussion   . SVT (supraventricular tachycardia) (HCC) 04/29/2015  . Dehydration with hypernatremia 04/29/2015  . Acute respiratory failure (HCC) 04/27/2015  . Acute on chronic respiratory failure with hypercapnia (HCC) 04/27/2015  . Encephalopathy acute 04/27/2015  . COPD (chronic  obstructive pulmonary disease) (HCC) 04/27/2015  . Status post thoracentesis   . Hyperosmolality and/or hypernatremia   . Uremia   . Tachycardia   . 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 Acute on chronic diastolic CHF (congestive heart failure), NYHA class 1 (HCC)   . Ventilator dependence (HCC) 04/12/2015  . Acute respiratory failure with hypercapnia (HCC) 04/12/2015  . COPD with acute exacerbation (HCC) 04/12/2015  . Diastolic CHF, acute on chronic (HCC) 04/12/2015  . CAP (community acquired pneumonia) 04/12/2015  . Pleural effusion, right 04/11/2015  . Weight loss, abnormal 10/16/2011  . TOBACCO USER 06/03/2009  . AAA 06/03/2009  . Diabetes (HCC) 02/27/2009  . Pure hypercholesterolemia 02/27/2009  . MIGRAINE UNSP W/O INTRACT W/O STATUS MIGRAINOSUS 02/27/2009  . Essential hypertension 02/27/2009  . CORONARY ATHEROSCLEROSIS NATIVE CORONARY ARTERY 02/27/2009  . VENTRICULAR FIBRILLATION 02/27/2009  . SYNCOPE 02/27/2009  . CHEST PAIN UNSPECIFIED 02/27/2009  . 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 ANTERIOR CERVICAL DECOMP/DISCECTOMY FUSION    . BACK SURGERY    . CAROTID ENDARTERECTOMY Left   . CORONARY ARTERY BYPASS GRAFT  2003   Dr. 2004 - LIMA to LAD, SVG to OM, SVG to RCA    Allergies Ibuprofen  Family History  Problem Relation Age of Onset  . Heart attack Father   . Heart attack Mother   .  Heart attack Brother     Social History Social History   Tobacco Use  . Smoking status: Current Every Day Smoker    Packs/day: 0.50    Types: Cigarettes    Last attempt to quit: 08/07/2015    Years since quitting: 3.4  . Smokeless tobacco: Never Used  Substance Use Topics  . Alcohol use: No    Alcohol/week: 0.0 standard drinks  . Drug use: No    Review of Systems  Constitutional: No fever/chills Eyes: No  visual changes. ENT: No sore throat. Cardiovascular: Denies chest pain. Positive bilateral LE edema.  Respiratory: Positive shortness of breath. Gastrointestinal: No abdominal pain.  No nausea, no vomiting.  No diarrhea.  No constipation. Genitourinary: Negative for dysuria. Musculoskeletal: Negative for back pain. Skin: Negative for rash. Neurological: Negative for headaches, focal weakness or numbness.  10-point ROS otherwise negative.  ____________________________________________   PHYSICAL EXAM:  VITAL SIGNS: ED Triage Vitals  Enc Vitals Group     BP 02/02/19 0907 (!) 119/56     Pulse Rate 02/02/19 0912 60     Resp 02/02/19 0912 20     Temp 02/02/19 0912 97.6 F (36.4 C)     Temp Source 02/02/19 0912 Oral     SpO2 02/02/19 0912 (!) 83 %     Weight 02/02/19 0901 178 lb (80.7 kg)     Height 02/02/19 0901 5\' 5"  (1.651 m)   Constitutional: Alert and oriented. Well appearing and in no acute distress. Eyes: Conjunctivae are normal. Head: Atraumatic. Nose: No congestion/rhinnorhea. Mouth/Throat: Mucous membranes are moist.  Neck: No stridor.  Cardiovascular: Normal rate, regular rhythm. Good peripheral circulation. Grossly normal heart sounds.   Respiratory: Increased respiratory effort.  No retractions. Lungs with rales at the bases.  Gastrointestinal: Soft and nontender. No distention.  Musculoskeletal: No lower extremity tenderness with 2+ pitting edema in the bilateral LEs.  Neurologic:  Normal speech and language. No gross focal neurologic deficits are appreciated.  Skin:  Skin is warm, dry and intact. No rash noted.  ____________________________________________   LABS (all labs ordered are listed, but only abnormal results are displayed)  Labs Reviewed  COMPREHENSIVE METABOLIC PANEL - Abnormal; Notable for the following components:      Result Value   Chloride 97 (*)    Glucose, Bld 110 (*)    BUN 44 (*)    Creatinine, Ser 1.74 (*)    Albumin 3.4 (*)    GFR  calc non Af Amer 37 (*)    GFR calc Af Amer 43 (*)    All other components within normal limits  BRAIN NATRIURETIC PEPTIDE - Abnormal; Notable for the following components:   B Natriuretic Peptide 1,117.0 (*)    All other components within normal limits  CBC WITH DIFFERENTIAL/PLATELET - Abnormal; Notable for the following components:   RBC 3.83 (*)    Hemoglobin 10.3 (*)    MCV 102.6 (*)    MCHC 26.2 (*)    RDW 16.4 (*)    All other components within normal limits  SARS CORONAVIRUS 2 (HOSPITAL ORDER, PERFORMED [facility])  TROPONIN I (HIGH SENSITIVITY)  TROPONIN I (HIGH SENSITIVITY)   ____________________________________________  EKG   EKG Interpretation  Date/Time:  Thursday February 02 2019 09:08:47 EDT Ventricular Rate:  60 PR Interval:    QRS Duration: 118 QT Interval:  444 QTC Calculation: 444 R Axis:   77 Text Interpretation:  Sinus rhythm Incomplete right bundle branch block Anteroseptal infarct, old Nonspecific  repol abnormality, lateral leads No STEMI  Confirmed by Nanda Quinton [phone]) on 02/02/2019 9:18:12 AM       ____________________________________________  RADIOLOGY  Dg Chest Portable 1 View  Result Date: 02/02/2019 CLINICAL DATA:  Shortness of breath. EXAM: PORTABLE CHEST 1 VIEW COMPARISON:  01/31/2019. FINDINGS: Prior CABG changes. Stable cardiomediastinal contours. Calcific aortic knob. Mild pulmonary vascular congestion. Small bilateral pleural effusions, left greater than right with associated bibasilar opacities. Findings similar to prior. No pneumothorax. IMPRESSION: 1. Small bilateral effusions, left greater than right, with associated bibasilar opacities, likely atelectasis. 2. Findings suggesting fluid overload/mild CHF. Findings are similar to prior. Electronically Signed   By: Davina Poke M.D.   On: 02/02/2019 09:59    ____________________________________________   PROCEDURES  Procedure(s) performed:   Procedures   CRITICAL CARE Performed by: Margette Fast Total critical care time: 35 minutes Critical care time was exclusive of separately billable procedures and treating other patients. Critical care was necessary to treat or prevent imminent or life-threatening deterioration. Critical care was time spent personally by me on the following activities: development of treatment plan with patient and/or surrogate as well as nursing, discussions with consultants, evaluation of patient's response to treatment, examination of patient, obtaining history from patient or surrogate, ordering and performing treatments and interventions, ordering and review of laboratory studies, ordering and review of radiographic studies, pulse oximetry and re-evaluation of patient's condition.  Nanda Quinton, MD Emergency Medicine  ____________________________________________   INITIAL IMPRESSION / ASSESSMENT AND PLAN / ED COURSE  Pertinent labs & imaging results that were available during my care of the patient were reviewed by me and considered in my medical decision making (see chart for details).   Patient presents to the emergency department for evaluation of worsening shortness of breath and lower extremity edema.  Symptoms have been progressively worsening despite increasing his home Lasix.  He was hypoxic on arrival but apparently had turned off his oxygen while in the car for some unknown reason.  He did require increase O2 to 4 L and will try to titrate downward as able.  He has rails on exam and symptoms consistent with CHF.  Very low suspicion for COVID-19.  I do anticipate admission as the patient has failed to diurese at home.  10:15 AM Patient's BNP at 1,100 with fluid on CXR consistent with volume overload. Patient able to be titrated down to 2L [place] which is his baseline. Will discuss with [facility] regarding admit.   COVID negative.   Discussed patient's case with [facility], Dr. Carles Collet to request admission. Patient and family (if  present) updated with plan. Care transferred to [facility] service.  I reviewed all nursing notes, vitals, pertinent old records, EKGs, labs, imaging (as available).  ____________________________________________  FINAL CLINICAL IMPRESSION(S) / ED DIAGNOSES  Final diagnoses:  Acute on chronic systolic congestive heart failure (HCC)  Peripheral edema    MEDICATIONS GIVEN DURING THIS VISIT:  Medications  furosemide (LASIX) injection 80 mg (has no administration in time range)  oxyCODONE (Oxy IR/ROXICODONE) immediate release tablet 10 mg (10 mg Oral Given 02/02/19 0935)    Note:  This document was prepared using Dragon voice recognition software and may include unintentional dictation errors.  Nanda Quinton, MD, [facility] Emergency Medicine    Kipton Skillen, [facility], MD 02/02/19 1056

## 2019-02-02 NOTE — H&P (Signed)
History and Physical  Miguel Mendoza UJW:[id number] DOB: Dec 06, 1940 DOA: 02/02/2019   PCP: [name], Ashish, MD   Patient coming from: Home  Chief Complaint: dyspnea  HPI:  Miguel GriceMarvin C Moseman is a 78 y.o. 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  The patient states that he visited his primary care provider on 01/27/2019 during which time his diuretic dose was increased.  He continued to have shortness of breath.  [facility], and his diuretic was changed to what he believed to be furosemide 100 mg daily.  However, upon further questioning it appears that the patient has poor health literacy and really does not know the actual dosing or name of his diuretic.  His past medical records have shown that the patient was previously taking torsemide 60 mg twice daily.  In actuality, the patient is unclear regarding the actual diuretic pill and dosing upon further questioning.  Nevertheless, he states that he has been compliant with the medication, but states that he has had indiscretion with his fluid intake.  He states that he has been drinking more than 10 cups of water daily.  He complains of increasing lower extremity edema and orthopnea type symptoms.  He denies any fevers, chills, chest pain, nausea, vomiting, diarrhea, domino pain.  He has a cough with white sputum.  He denies any hemoptysis.  He complains of increasing abdominal girth. In the emergency department, the patient was afebrile hemodynamically stable saturating 85% on 2 L.  His oxygen was increased to 5 L with saturations 92-94%.  The patient was given furosemide 80 mg IV x1.  Assessment/Plan: Acute on chronic respiratory failure with hypoxia  -Secondary to COPD exacerbation and CHF exacerbation -Patient is currently on 5 L nasal cannula -At baseline, patient is on 2 L -Wean oxygen for saturation greater 92%  Acute on chronic systolic and diastolic CHF -04/26/2018 echo EF 40%, grade 1 DD, diffuse HK, HK of the mid anterior and basal mid anteroseptal.  Mild to moderate left ear, mild TR -Start furosemide 80 mg IV twice daily -Daily weights -Accurate I's and O's -Repeat echocardiogram  -Restart metoprolol -Restart Aldactone  COPD exacerbation -Start Pulmicort -Start IV Solu-Medrol -Start duo nebs  Essential hypertension -Decrease home dose of metoprolol secondary to soft blood pressures -Holding losartan secondary to CKD  CKD stage III -Baseline creatinine 1.4-1.6 -Patient presented with serum creatinine 1.74  Chronic back pain -Restart Percocet at lower dose -Patient states that he takes Percocet 10 mg/325 q 6 hours at home  Depression/anxiety -Continue home dose of sertraline  Diabetes mellitus type 2 -Holding metformin -NovoLog sliding scale -Check hemoglobin A1c  Anemia of chronic disease -Serum B12 -Folic acid -Iron studies  Hyperlipidemia -restart statin      Past Medical History:  Diagnosis Date  . Anxiety   . CAD (coronary artery disease)    Multivessel status post stent to LAD 1998 then CABG 2003  . Cervical disc disease   . Dyslipidemia   . Essential hypertension   . GERD (gastroesophageal reflux disease)   . History of stroke [id number]  . Migraine headache   . Myocardial infarction (HCC)   . Respiratory failure (HCC)   . Type 2 diabetes mellitus (HCC)  Past Surgical History:  Procedure Laterality Date  . ANTERIOR CERVICAL DECOMP/DISCECTOMY FUSION    . BACK SURGERY    . CAROTID ENDARTERECTOMY Left   . CORONARY ARTERY BYPASS GRAFT  2003   Dr. Roxy Manns - LIMA to LAD, SVG to OM, SVG to RCA   Social History:  reports that he has been smoking cigarettes. He has been smoking about 0.50  packs per day. He has never used smokeless tobacco. He reports that he does not drink alcohol or use drugs.   Family History  Problem Relation Age of Onset  . Heart attack Father   . Heart attack Mother   . Heart attack Brother      Allergies  Allergen Reactions  . Ibuprofen Hives     Prior to Admission medications   Medication Sig Start Date End Date Taking? Authorizing Provider  albuterol (PROVENTIL HFA;VENTOLIN HFA) 108 (90 Base) MCG/ACT inhaler Inhale 2 puffs into the lungs every 6 (six) hours as needed for wheezing or shortness of breath.    [provider]  atorvastatin (LIPITOR) 20 MG tablet Take 20 mg by mouth daily.    [provider]  baclofen (LIORESAL) 10 MG tablet Take 5 mg by mouth 2 (two) times daily.    [provider]  Cyanocobalamin (VITAMIN B-12 IJ) Inject as directed once a week.    [provider]  losartan (COZAAR) 25 MG tablet Take 25 mg by mouth daily.    [provider]  metFORMIN (GLUCOPHAGE) 500 MG tablet Take by mouth. Take 2 tabs by mouth every morning & 1 tab every evening    [provider]  metoprolol tartrate (LOPRESSOR) 100 MG tablet Take 100 mg by mouth. Take 1 tab by mouth every morning & 1/2 tab every evening    [provider]  nitroGLYCERIN (NITROSTAT) 0.4 MG SL tablet Place 0.4 mg under the tongue every 5 (five) minutes as needed.    [provider]  omeprazole (PRILOSEC) 20 MG capsule Take 20 mg by mouth daily.    [provider]  oxyCODONE-acetaminophen (PERCOCET) 7.5-325 MG tablet Take 1 tablet by mouth every 4 (four) hours as needed for moderate pain. Max APAP 3gm/24 hrs from all sources 06/20/15   Reed, Tiffany L, DO  sertraline (ZOLOFT) 50 MG tablet Take 50 mg by mouth daily.    [provider]  spironolactone (ALDACTONE) 25 MG tablet Take 25 mg by mouth daily.    [provider]  tiZANidine (ZANAFLEX) 4 MG capsule Take 4 mg by mouth every 8  (eight) hours.     [provider]  torsemide (DEMADEX) 20 MG tablet Take 60 mg by mouth 2 (two) times daily.    [provider]    Review of Systems:  Constitutional:  No weight loss, night sweats, Fevers, chills, fatigue.  Head&Eyes: No headache.  No vision loss.  No eye pain or scotoma ENT:  No Difficulty swallowing,Tooth/dental problems,Sore throat,  No ear ache, post nasal drip,  Cardio-vascular:  No chest pain,  dizziness, palpitations  GI:  No  abdominal pain, nausea, vomiting, diarrhea, loss of appetite, hematochezia, melena, heartburn, indigestion, Resp:   No coughing up of blood .No chest wall deformity  Skin:  no rash or lesions.  GU:  no dysuria, change in color of urine, no urgency or frequency. No flank pain.  Musculoskeletal:  No joint pain or swelling. No decreased range of motion. No back pain.  Psych:  No change in mood or  affect.  Neurologic: No headache, no dysesthesia, no focal weakness, no vision loss. No syncope  Physical Exam: Vitals:   02/02/19 0913 02/02/19 0920 02/02/19 0930 02/02/19 0945  BP:      Pulse:  (!) 58 (!) 57 (!) 58  Resp:  17    Temp:      TempSrc:      SpO2: 95% 99% 95% 95%  Weight:      Height:       General:  A&O x 3, NAD, nontoxic, pleasant/cooperative Head/Eye: No conjunctival hemorrhage, no icterus, [place]/AT, No nystagmus ENT:  No icterus,  No thrush, good dentition, no pharyngeal exudate Neck:  No masses, no lymphadenpathy, no bruits CV:  RRR, no rub, no gallop, no S3 Lung:  Bilateral rhonchi and wheezing Abdomen: soft/NT, +BS, nondistended, no peritoneal signs Ext: No cyanosis, No rashes, No petechiae, No lymphangitis, 2 + LE edema Neuro: CNII-XII intact, strength 4/5 in bilateral upper and lower extremities, no dysmetria  Labs on Admission:  Basic Metabolic Panel: Recent Labs  Lab 02/02/19 0915  NA 141  K 5.0  CL 97*  CO2 32  GLUCOSE 110*  BUN 44*  CREATININE 1.74*  CALCIUM 9.2   Liver  Function Tests: Recent Labs  Lab 02/02/19 0915  AST 16  ALT 12  ALKPHOS 70  BILITOT 0.4  PROT 7.7  ALBUMIN 3.4*   No results for input(s): LIPASE, AMYLASE in the last 168 hours. No results for input(s): AMMONIA in the last 168 hours. CBC: Recent Labs  Lab 02/02/19 0915  WBC 6.9  NEUTROABS 5.1  HGB 10.3*  HCT 39.3  MCV 102.6*  PLT 238   Coagulation Profile: No results for input(s): INR, PROTIME in the last 168 hours. Cardiac Enzymes: No results for input(s): CKTOTAL, CKMB, CKMBINDEX, TROPONINI in the last 168 hours. BNP: Invalid input(s): POCBNP CBG: No results for input(s): GLUCAP in the last 168 hours. Urine analysis:    Component Value Date/Time   COLORURINE YELLOW 04/27/2015 0210   APPEARANCEUR CLEAR 04/27/2015 0210   LABSPEC 1.025 04/27/2015 0210   PHURINE 5.5 04/27/2015 0210   GLUCOSEU NEGATIVE 04/27/2015 0210   HGBUR TRACE (A) 04/27/2015 0210   BILIRUBINUR NEGATIVE 04/27/2015 0210   KETONESUR NEGATIVE 04/27/2015 0210   PROTEINUR 30 (A) 04/27/2015 0210   NITRITE NEGATIVE 04/27/2015 0210   LEUKOCYTESUR TRACE (A) 04/27/2015 0210   Sepsis Labs: (procalcitonin:4,lacticidven:4) ) Recent Results (from the past 240 hour(s))  SARS Coronavirus 2 [facility] order, Performed in [facility] hospital lab) Nasopharyngeal Nasopharyngeal Swab     Status: None   Collection Time: 02/02/19  9:16 AM   Specimen: Nasopharyngeal Swab  Result Value Ref Range Status   SARS Coronavirus 2 NEGATIVE NEGATIVE Final    Comment: (NOTE) If result is NEGATIVE SARS-CoV-2 target nucleic acids are NOT DETECTED. The SARS-CoV-2 RNA is generally detectable in upper and lower  respiratory specimens during the acute phase of infection. The lowest  concentration of SARS-CoV-2 viral copies this assay can detect is 250  copies / mL. A negative result does not preclude SARS-CoV-2 infection  and should not be used as the sole basis for treatment or other  patient management decisions.   A negative result may occur with  improper specimen collection / handling, submission of specimen other  than nasopharyngeal swab, presence of viral mutation(s) within the  areas targeted by this assay, and inadequate number of viral copies  (<250 copies / mL). A negative result must be combined with clinical  observations,  patient history, and epidemiological information. If result is POSITIVE SARS-CoV-2 target nucleic acids are DETECTED. The SARS-CoV-2 RNA is generally detectable in upper and lower  respiratory specimens dur ing the acute phase of infection.  Positive  results are indicative of active infection with SARS-CoV-2.  Clinical  correlation with patient history and other diagnostic information is  necessary to determine patient infection status.  Positive results do  not rule out bacterial infection or co-infection with other viruses. If result is PRESUMPTIVE POSTIVE SARS-CoV-2 nucleic acids MAY BE PRESENT.   A presumptive positive result was obtained on the submitted specimen  and confirmed on repeat testing.  While 2019 novel coronavirus  (SARS-CoV-2) nucleic acids may be present in the submitted sample  additional confirmatory testing may be necessary for epidemiological  and / or clinical management purposes  to differentiate between  SARS-CoV-2 and other Sarbecovirus currently known to infect humans.  If clinically indicated additional testing with an alternate test  methodology [phone]) is advised. The SARS-CoV-2 RNA is generally  detectable in upper and lower respiratory sp ecimens during the acute  phase of infection. The expected result is Negative. Fact Sheet for Patients:  BoilerBrush.com.cy Fact Sheet for Healthcare Providers: https://pope.com/ This test is not yet approved or cleared by the [place] FDA and has been authorized for detection and/or diagnosis of SARS-CoV-2 by FDA under an Emergency Use  Authorization (EUA).  This EUA will remain in effect (meaning this test can be used) for the duration of the COVID-19 declaration under Section 564(b)(1) of the Act, 21 U.S.C. section 360bbb-3(b)(1), unless the authorization is terminated or revoked sooner. Performed at [facility], [address], [place], [place] [address]      Radiological Exams on Admission: Dg Chest Portable 1 View  Result Date: 02/02/2019 CLINICAL DATA:  Shortness of breath. EXAM: PORTABLE CHEST 1 VIEW COMPARISON:  01/31/2019. FINDINGS: Prior CABG changes. Stable cardiomediastinal contours. Calcific aortic knob. Mild pulmonary vascular congestion. Small bilateral pleural effusions, left greater than right with associated bibasilar opacities. Findings similar to prior. No pneumothorax. IMPRESSION: 1. Small bilateral effusions, left greater than right, with associated bibasilar opacities, likely atelectasis. 2. Findings suggesting fluid overload/mild CHF. Findings are similar to prior. Electronically Signed   By: Duanne Guess M.D.   On: 02/02/2019 09:59    EKG: Independently reviewed. Sinus, IRBBB, nonspecific STT    Time spent:60 minutes Code Status:   FULL Family Communication:  No Family at bedside Disposition Plan: expect 2-3 day hospitalization Consults called: none DVT Prophylaxis: [place] Heparin    Catarina Hartshorn, DO  [facility] Pager [phone]  If 7PM-7AM, please contact night-coverage [email or web address] Password [email or web address] 02/02/2019, 11:13 AM

## 2019-02-03 ENCOUNTER — Inpatient Hospital Stay (HOSPITAL_COMMUNITY): Payer: Medicare HMO

## 2019-02-03 DIAGNOSIS — I361 Nonrheumatic tricuspid (valve) insufficiency: Secondary | ICD-10-CM

## 2019-02-03 DIAGNOSIS — I350 Nonrheumatic aortic (valve) stenosis: Secondary | ICD-10-CM

## 2019-02-03 LAB — ECHOCARDIOGRAM COMPLETE
Height: 65 in
Weight: 2864.22 oz

## 2019-02-03 LAB — BASIC METABOLIC PANEL
Anion gap: 11 (ref 5–15)
Anion gap: 15 (ref 5–15)
BUN: 49 mg/dL — ABNORMAL HIGH (ref 8–23)
BUN: 49 mg/dL — ABNORMAL HIGH (ref 8–23)
CO2: 37 mmol/L — ABNORMAL HIGH (ref 22–32)
CO2: 33 mmol/L — ABNORMAL HIGH (ref 22–32)
Calcium: 9.3 mg/dL (ref 8.9–10.3)
Calcium: 9.1 mg/dL (ref 8.9–10.3)
Chloride: 93 mmol/L — ABNORMAL LOW (ref 98–111)
Chloride: 90 mmol/L — ABNORMAL LOW (ref 98–111)
Creatinine, Ser: 1.52 mg/dL — ABNORMAL HIGH (ref 0.61–1.24)
Creatinine, Ser: 1.5 mg/dL — ABNORMAL HIGH (ref 0.61–1.24)
GFR calc Af Amer: 51 mL/min — ABNORMAL LOW (ref >60)
GFR calc Af Amer: 52 mL/min — ABNORMAL LOW (ref >60)
GFR calc non Af Amer: 44 mL/min — ABNORMAL LOW (ref >60)
GFR calc non Af Amer: 45 mL/min — ABNORMAL LOW (ref >60)
Glucose, Bld: 157 mg/dL — ABNORMAL HIGH (ref 70–99)
Glucose, Bld: 200 mg/dL — ABNORMAL HIGH (ref 70–99)
Potassium: 5.5 mmol/L — ABNORMAL HIGH (ref 3.5–5.1)
Potassium: 4.7 mmol/L (ref 3.5–5.1)
Sodium: 141 mmol/L (ref 135–145)
Sodium: 138 mmol/L (ref 135–145)

## 2019-02-03 LAB — GLUCOSE, CAPILLARY
Glucose-Capillary: 180 mg/dL — ABNORMAL HIGH (ref 70–99)
Glucose-Capillary: 190 mg/dL — ABNORMAL HIGH (ref 70–99)
Glucose-Capillary: 160 mg/dL — ABNORMAL HIGH (ref 70–99)
Glucose-Capillary: 162 mg/dL — ABNORMAL HIGH (ref 70–99)

## 2019-02-03 LAB — HEMOGLOBIN A1C
Hgb A1c MFr Bld: 6.1 % — ABNORMAL HIGH (ref 4.8–5.6)
Mean Plasma Glucose: 128.37 mg/dL

## 2019-02-03 MED ORDER — ALUM & MAG HYDROXIDE-SIMETH 200-200-20 MG/5ML PO SUSP
30.0000 mL | Freq: Four times a day (QID) | ORAL | Status: DC | PRN
  Administered 2019-02-03: 30 mL via ORAL
  Filled 2019-02-03: qty 30

## 2019-02-03 MED ORDER — SODIUM CHLORIDE 0.9 % IV SOLN
510.0000 mg | Freq: Once | INTRAVENOUS | Status: AC
  Administered 2019-02-03: 510 mg via INTRAVENOUS
  Filled 2019-02-03: qty 17

## 2019-02-03 NOTE — Progress Notes (Signed)
2D complete echocardiogram performed.  

## 2019-02-03 NOTE — Progress Notes (Signed)
1950: Patient c/o "chest pain".  When asking patient to point to where he feels the pain, he points to his epigastric area and describes it as a "twinge".  Vitals are stable.  No changes noted on telemetry monitor.  Patient slid up in bed, and said he felt much better.  Will continue to monitor patient.

## 2019-02-03 NOTE — Progress Notes (Signed)
PROGRESS NOTE  Miguel Mendoza ZOX:[id number] DOB: Apr 26, 1941 DOA: 02/02/2019 PCP: Miguel PeriShah, Ashish, MD  Brief History:  78 y.o. 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  The patient states that he visited his primary care provider on 01/27/2019 during which time his diuretic dose was increased.  He continued to have shortness of breath.  He [facility] at [facility], and his diuretic was changed to what he believed to be furosemide 100 mg daily.  However, upon further questioning it appears that the patient has poor health literacy and really does not know the actual dosing or name of his diuretic.  His past medical records have shown that the patient was previously taking torsemide 60 mg twice daily.  In actuality, the patient is unclear regarding the actual diuretic pill and dosing upon further questioning.  Nevertheless, he states that he has been compliant with the medication, but states that he has had indiscretion with his fluid intake.  He states that he has been drinking more than 10 cups of water daily.  He complains of increasing lower extremity edema and orthopnea type symptoms.  He denies any fevers, chills, chest pain, nausea, vomiting, diarrhea, domino pain.  He has a cough with white sputum.  He denies any hemoptysis.  He complains of increasing abdominal girth. In the emergency department, the patient was afebrile hemodynamically stable saturating 85% on 2 L.  His oxygen was increased to 5 L with saturations 92-94%.  The patient was given furosemide 80 mg IV x1.  Assessment/Plan: Acute on chronic respiratory failure with hypoxia -Secondary to COPD exacerbation and CHF exacerbation -Patient is  currently on 4 L nasal cannula -At baseline, patient is on 2 L -Wean oxygen for saturation greater 92% -personally reviewed CXR--increase interstitial markings  Acute on chronic systolic and diastolic CHF -04/26/2018 echo EF 40%, grade 1 DD, diffuse HK, HK of the mid anterior and basal mid anteroseptal.  Mild to moderate left ear, mild TR -Start furosemide 80 mg IV twice daily -Daily weights -Accurate I's and O's -02/03/19 echo-EF 40-45%, impaired relax, mild TR, mild-mod AS -Continue metoprolol -Discontinue Aldactone due to hyperkalemia  COPD exacerbation -Continue Pulmicort -Continue IV Solu-Medrol -Continue duo nebs  Hyperkalemia -d/c spironolactone and losartan -repeat BMP  Essential hypertension -Continue decreased dose of metoprolol secondary to soft blood pressures -Discontinue losartan secondary to CKD and hyperkalemia  CKD stage III -Baseline creatinine 1.4-1.6 -Patient presented with serum creatinine 1.74 -am BMP  Chronic back pain -Restart [facility] at lower dose -Patient states that he takes [facility] 10 mg/325 q 6 hours at home  Depression/anxiety -Continue home dose of sertraline  Diabetes mellitus type 2 -Holding metformin -NovoLog sliding scale -Check hemoglobin A1c--6.1  Anemia of chronic disease -Serum B12--1880 -Folic acid--10.7 -Iron saturation 7%, ferritin 17 -feraheme x 1  Hyperlipidemia -restart statin      Disposition Plan:   Home in 1-2 days  Family Communication:   No Family at bedside  Consultants:  none  Code Status:  FULL   DVT Prophylaxis:  [facility] Heparin / [place] Lovenox   Procedures: As Listed in Progress Note Above  Antibiotics: None    Subjective: Pt is breathing better, but remains sob.  Denies f/c, cp, n/v/d,  abd pain, dysuria, hemoptysis  Objective: Vitals:   02/03/19 0500 02/03/19 0515 02/03/19 0802 02/03/19 0808  BP:  137/60    Pulse:  68    Resp:  20    Temp:  98.2 F (36.8 C)    TempSrc:  Oral     SpO2:  96% 95% 95%  Weight: 81.2 kg     Height:        Intake/Output Summary (Last 24 hours) at 02/03/2019 1258 Last data filed at 02/03/2019 0500 Gross per 24 hour  Intake 480 ml  Output 3000 ml  Net -2520 ml   Weight change:  Exam:   General:  Pt is alert, follows commands appropriately, not in acute distress  HEENT: No icterus, No thrush, No neck mass, [place]/AT  Cardiovascular: RRR, S1/S2, no rubs, no gallops  Respiratory: bilateral crackles, bibasilar wheeze  Abdomen: Soft/+BS, non tender, non distended, no guarding  Extremities: 2+LE edema, No lymphangitis, No petechiae, No rashes, no synovitis   Data Reviewed: I have personally reviewed following labs and imaging studies Basic Metabolic Panel: Recent Labs  Lab 02/02/19 0915 02/03/19 0421  NA 141 141  K 5.0 5.5*  CL 97* 93*  CO2 32 37*  GLUCOSE 110* 157*  BUN 44* 49*  CREATININE 1.74* 1.52*  CALCIUM 9.2 9.3   Liver Function Tests: Recent Labs  Lab 02/02/19 0915  AST 16  ALT 12  ALKPHOS 70  BILITOT 0.4  PROT 7.7  ALBUMIN 3.4*   No results for input(s): LIPASE, AMYLASE in the last 168 hours. No results for input(s): AMMONIA in the last 168 hours. Coagulation Profile: No results for input(s): INR, PROTIME in the last 168 hours. CBC: Recent Labs  Lab 02/02/19 0915  WBC 6.9  NEUTROABS 5.1  HGB 10.3*  HCT 39.3  MCV 102.6*  PLT 238   Cardiac Enzymes: No results for input(s): CKTOTAL, CKMB, CKMBINDEX, TROPONINI in the last 168 hours. BNP: Invalid input(s): POCBNP CBG: Recent Labs  Lab 02/02/19 1711 02/02/19 2111 02/03/19 0818 02/03/19 1206  GLUCAP 101* 177* 180* 190*   HbA1C: Recent Labs    02/03/19 0421  HGBA1C 6.1*   Urine analysis:    Component Value Date/Time   COLORURINE YELLOW 04/27/2015 0210   APPEARANCEUR CLEAR 04/27/2015 0210   LABSPEC 1.025 04/27/2015 0210   PHURINE 5.5 04/27/2015 0210   GLUCOSEU NEGATIVE 04/27/2015 0210   HGBUR TRACE (A) 04/27/2015 0210    BILIRUBINUR NEGATIVE 04/27/2015 0210   KETONESUR NEGATIVE 04/27/2015 0210   PROTEINUR 30 (A) 04/27/2015 0210   NITRITE NEGATIVE 04/27/2015 0210   LEUKOCYTESUR TRACE (A) 04/27/2015 0210   Sepsis Labs: @LABRCNTIP (procalcitonin:4,lacticidven:4) ) Recent Results (from the past 240 hour(s))  SARS Coronavirus 2 [facility] order, Performed in [facility] hospital lab) Nasopharyngeal Nasopharyngeal Swab     Status: None   Collection Time: 02/02/19  9:16 AM   Specimen: Nasopharyngeal Swab  Result Value Ref Range Status   SARS Coronavirus 2 NEGATIVE NEGATIVE Final    Comment: (NOTE) If result is NEGATIVE SARS-CoV-2 target nucleic acids are NOT DETECTED. The SARS-CoV-2 RNA is generally detectable in upper and lower  respiratory specimens during the acute phase of infection. The lowest  concentration of SARS-CoV-2 viral copies this assay can detect is 250  copies / mL. A negative result does not preclude SARS-CoV-2 infection  and should not be used as the sole basis for treatment or other  patient management decisions.  A negative result may occur with  improper specimen collection / handling,  submission of specimen other  than nasopharyngeal swab, presence of viral mutation(s) within the  areas targeted by this assay, and inadequate number of viral copies  (<250 copies / mL). A negative result must be combined with clinical  observations, patient history, and epidemiological information. If result is POSITIVE SARS-CoV-2 target nucleic acids are DETECTED. The SARS-CoV-2 RNA is generally detectable in upper and lower  respiratory specimens dur ing the acute phase of infection.  Positive  results are indicative of active infection with SARS-CoV-2.  Clinical  correlation with patient history and other diagnostic information is  necessary to determine patient infection status.  Positive results do  not rule out bacterial infection or co-infection with other viruses. If result is PRESUMPTIVE  POSTIVE SARS-CoV-2 nucleic acids MAY BE PRESENT.   A presumptive positive result was obtained on the submitted specimen  and confirmed on repeat testing.  While 2019 novel coronavirus  (SARS-CoV-2) nucleic acids may be present in the submitted sample  additional confirmatory testing may be necessary for epidemiological  and / or clinical management purposes  to differentiate between  SARS-CoV-2 and other Sarbecovirus currently known to infect humans.  If clinically indicated additional testing with an alternate test  methodology [phone]) is advised. The SARS-CoV-2 RNA is generally  detectable in upper and lower respiratory sp ecimens during the acute  phase of infection. The expected result is Negative. Fact Sheet for Patients:  BoilerBrush.com.cy Fact Sheet for Healthcare Providers: https://pope.com/ This test is not yet approved or cleared by the [place] FDA and has been authorized for detection and/or diagnosis of SARS-CoV-2 by FDA under an Emergency Use Authorization (EUA).  This EUA will remain in effect (meaning this test can be used) for the duration of the COVID-19 declaration under Section 564(b)(1) of the Act, 21 U.S.C. section 360bbb-3(b)(1), unless the authorization is terminated or revoked sooner. Performed at [facility], [address] [place], [place] [address]      Scheduled Meds: . atorvastatin  20 mg Oral Daily  . budesonide (PULMICORT) nebulizer solution  0.5 mg Nebulization BID  . furosemide  80 mg Intravenous BID  . heparin  5,000 Units Subcutaneous Q8H  . insulin aspart  0-9 Units Subcutaneous TID WC  . ipratropium-albuterol  3 mL Nebulization Q6H  . methylPREDNISolone (SOLU-MEDROL) injection  60 mg Intravenous Q6H  . metoprolol tartrate  25 mg Oral BID  . pantoprazole  40 mg Oral Daily  . sertraline  50 mg Oral Daily  . sodium chloride flush  3 mL Intravenous Q12H   Continuous Infusions: . sodium  chloride      Procedures/Studies: Dg Chest Portable 1 View  Result Date: 02/02/2019 CLINICAL DATA:  Shortness of breath. EXAM: PORTABLE CHEST 1 VIEW COMPARISON:  01/31/2019. FINDINGS: Prior CABG changes. Stable cardiomediastinal contours. Calcific aortic knob. Mild pulmonary vascular congestion. Small bilateral pleural effusions, left greater than right with associated bibasilar opacities. Findings similar to prior. No pneumothorax. IMPRESSION: 1. Small bilateral effusions, left greater than right, with associated bibasilar opacities, likely atelectasis. 2. Findings suggesting fluid overload/mild CHF. Findings are similar to prior. Electronically Signed   By: Duanne Guess M.D.   On: 02/02/2019 09:59    Catarina Hartshorn, DO  [facility] Pager [phone]  If 7PM-7AM, please contact night-coverage [email or web address] Password [email or web address] 02/03/2019, 12:58 PM   LOS: 1 day

## 2019-02-03 NOTE — Care Management Important Message (Signed)
Important Message  Patient Details  Name: Miguel Mendoza MRN: [id number] Date of Birth: [birth year]   Medicare Important Message Given:  Yes     Tommy Medal 02/03/2019, 12:30 PM

## 2019-02-03 NOTE — Progress Notes (Signed)
Maalox ordered per protocol for epigastric pain.

## 2019-02-04 LAB — BASIC METABOLIC PANEL
Anion gap: 12 (ref 5–15)
BUN: 57 mg/dL — ABNORMAL HIGH (ref 8–23)
CO2: 39 mmol/L — ABNORMAL HIGH (ref 22–32)
Calcium: 9.4 mg/dL (ref 8.9–10.3)
Chloride: 88 mmol/L — ABNORMAL LOW (ref 98–111)
Creatinine, Ser: 1.32 mg/dL — ABNORMAL HIGH (ref 0.61–1.24)
GFR calc Af Amer: >60 mL/min (ref >60)
GFR calc non Af Amer: 52 mL/min — ABNORMAL LOW (ref >60)
Glucose, Bld: 163 mg/dL — ABNORMAL HIGH (ref 70–99)
Potassium: 4.8 mmol/L (ref 3.5–5.1)
Sodium: 139 mmol/L (ref 135–145)

## 2019-02-04 LAB — GLUCOSE, CAPILLARY
Glucose-Capillary: 165 mg/dL — ABNORMAL HIGH (ref 70–99)
Glucose-Capillary: 207 mg/dL — ABNORMAL HIGH (ref 70–99)
Glucose-Capillary: 132 mg/dL — ABNORMAL HIGH (ref 70–99)
Glucose-Capillary: 200 mg/dL — ABNORMAL HIGH (ref 70–99)

## 2019-02-04 MED ORDER — TIZANIDINE HCL 2 MG PO TABS: 2.0000 mg | ORAL_TABLET | Freq: Two times a day (BID) | ORAL | Status: DC | PRN

## 2019-02-04 MED ORDER — LIDOCAINE 5 % EX PTCH
2.0000 | MEDICATED_PATCH | CUTANEOUS | Status: DC
  Administered 2019-02-04 – 2019-02-05 (×2): 2 via TRANSDERMAL
  Filled 2019-02-04 (×2): qty 2

## 2019-02-04 MED ORDER — TORSEMIDE 20 MG PO TABS
60.0000 mg | ORAL_TABLET | Freq: Two times a day (BID) | ORAL | Status: DC
  Administered 2019-02-05: 60 mg via ORAL
  Filled 2019-02-04 (×2): qty 3

## 2019-02-04 NOTE — Progress Notes (Signed)
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 The patient states that he visited his primary care provider on 01/27/2019 during which time his diuretic dose was increased. He continued to have shortness of breath. He visited emergency department at [facility] 01/30/2019, and his diuretic was changed to what he believed to be furosemide 100 mg daily. However, upon further questioning it appears that the patient has poor health literacy and really does not know the actual dosing or name of his diuretic. His past medical records have shown that the patient was previously taking torsemide 60 mg twice daily. In actuality, the patient is unclear regarding the actual diuretic pill and dosing upon further questioning. Nevertheless, he states that he has been compliant with the medication, but states that he has had indiscretion with his fluid intake. He states that he has been drinking more than 10 cups of water daily. He complains of increasing lower extremity edema and orthopnea type symptoms. He denies any fevers, chills, chest pain, nausea, vomiting, diarrhea, domino pain. He has a cough with white sputum. He denies any hemoptysis. He complains of increasing abdominal girth. In the emergency department, the patient was afebrile hemodynamically stable saturating 85% on 2 L. His oxygen was increased to 5 L with saturations 92-94%. The patient was given furosemide 80 mg IV x1.  Assessment/Plan: Acute on chronic respiratory failure with hypoxia -Secondary to COPD exacerbation and CHF exacerbation -Patient is  currently on 4 L nasal cannula>>3L -At baseline, patient is on 2 L -Wean oxygen for saturation greater 92% -personally reviewed CXR--increase interstitial markings  Acute on chronic systolic and diastolic CHF -04/26/2018 echo EF 40%, grade 1 DD, diffuse HK, HK of the mid anterior and basal mid anteroseptal. Mild to moderate left ear, mild TR -Continue furosemide 80 mg IV twice daily -Daily weights--NEG 10 lbs -Accurate I's and O's--incomplete -02/03/19 echo-EF 40-45%, impaired relax, mild TR, mild-mod AS -Continue metoprolol -Discontinue Aldactone due to hyperkalemia  COPD exacerbation -Continue Pulmicort -Continue IV Solu-Medrol -Continue duo nebs  Hyperkalemia -d/c spironolactone and losartan -repeat BMP  Essential hypertension -Continue decreased dose of metoprolol secondary to soft blood pressures -Discontinue losartan secondary to CKD and hyperkalemia  CKD stage III -Baseline creatinine 1.4-1.6 -Patient presented with serum creatinine 1.74 -am BMP  Chronic back pain -Restart Percocet at lower dose -Patient states that he takes Percocet 10 mg/325q 6 hours at home  Depression/anxiety -Continue home dose of sertraline  Diabetes mellitus type 2 -Holding metformin -NovoLog sliding scale -Check hemoglobin A1c--6.1  Anemia of chronic disease -Serum B12--1880 -Folic acid--10.7 -Iron saturation 7%, ferritin 17 -feraheme x 1 given 10/9  Hyperlipidemia -restart statin  Chronic back pain -continue home dose percocet -lidoderm patch -restart reduced dose tinzidine    Disposition Plan:   Home 10/11 if stable  Family Communication:   No Family at bedside  Consultants:  none  Code Status:  FULL   DVT Prophylaxis:  [place] Heparin    Procedures: As Listed in Progress Note Above  Antibiotics: None  Subjective: Pt states he is breathing better, but still has some sob with exertion.  Denies cp, n/v/d, abd pain, hemoptysis, f/c  Objective: Vitals:   02/04/19 0119 02/04/19 0600 02/04/19 0646 02/04/19 0747  BP:  (!) 151/70    Pulse:  (!) 57    Resp:  (!) 22    Temp:  98.4 F (36.9 C)    TempSrc:  Oral    SpO2: 93% 93%  93%  Weight:   76 kg   Height:        Intake/Output Summary (Last 24 hours) at 02/04/2019 1218 Last data filed at 02/04/2019 1024 Gross per 24 hour  Intake 600.02 ml  Output 2900 ml  Net -2299.98 ml   Weight change: -4.74 kg Exam:   General:  Pt is alert, follows commands appropriately, not in acute distress  HEENT: No icterus, No thrush, No neck mass, [place]/AT  Cardiovascular: RRR, S1/S2, no rubs, no gallops  Respiratory: bibasilar crackles and wheeze  Abdomen: Soft/+BS, non tender, non distended, no guarding  Extremities: 1 + LE edema, No lymphangitis, No petechiae, No rashes, no synovitis   Data Reviewed: I have personally reviewed following labs and imaging studies Basic Metabolic Panel: Recent Labs  Lab 02/02/19 0915 02/03/19 0421 02/03/19 1256 02/04/19 0601  NA 141 141 138 139  K 5.0 5.5* 4.7 4.8  CL 97* 93* 90* 88*  CO2 32 37* 33* 39*  GLUCOSE 110* 157* 200* 163*  BUN 44* 49* 49* 57*  CREATININE 1.74* 1.52* 1.50* 1.32*  CALCIUM 9.2 9.3 9.1 9.4   Liver Function Tests: Recent Labs  Lab 02/02/19 0915  AST 16  ALT 12  ALKPHOS 70  BILITOT 0.4  PROT 7.7  ALBUMIN 3.4*   No results for input(s): LIPASE, AMYLASE in the last 168 hours. No results for input(s): AMMONIA in the last 168 hours. Coagulation Profile: No results for input(s): INR, PROTIME in the last 168 hours. CBC: Recent Labs  Lab 02/02/19 0915  WBC 6.9  NEUTROABS 5.1  HGB 10.3*  HCT 39.3  MCV 102.6*  PLT 238   Cardiac Enzymes: No results for input(s): CKTOTAL, CKMB, CKMBINDEX, TROPONINI in the last 168 hours. BNP: Invalid input(s): POCBNP CBG: Recent Labs  Lab 02/03/19 1206 02/03/19 1722 02/03/19 2157 02/04/19 0740 02/04/19 1140  GLUCAP 190* 160* 162* 165* 207*   HbA1C:  Recent Labs    02/03/19 0421  HGBA1C 6.1*   Urine analysis:    Component Value Date/Time   COLORURINE YELLOW 04/27/2015 0210   APPEARANCEUR CLEAR 04/27/2015 0210   LABSPEC 1.025 04/27/2015 0210   PHURINE 5.5 04/27/2015 0210   GLUCOSEU NEGATIVE 04/27/2015 0210   HGBUR TRACE (A) 04/27/2015 0210   BILIRUBINUR NEGATIVE 04/27/2015 0210   KETONESUR NEGATIVE 04/27/2015 0210   PROTEINUR 30 (A) 04/27/2015 0210   NITRITE NEGATIVE 04/27/2015 0210   LEUKOCYTESUR TRACE (A) 04/27/2015 0210   Sepsis Labs: @LABRCNTIP (procalcitonin:4,lacticidven:4) ) Recent Results (from the past 240 hour(s))  SARS Coronavirus 2 [facility] order, Performed in [facility] hospital lab) Nasopharyngeal Nasopharyngeal Swab     Status: None   Collection Time: 02/02/19  9:16 AM   Specimen: Nasopharyngeal Swab  Result Value Ref Range Status   SARS Coronavirus 2 NEGATIVE NEGATIVE Final    Comment: (NOTE) If result is NEGATIVE SARS-CoV-2 target nucleic acids are NOT DETECTED. The SARS-CoV-2 RNA is generally detectable in upper and lower  respiratory specimens during the acute phase of infection. The lowest  concentration of SARS-CoV-2 viral copies this assay can detect is 250  copies / mL. A negative result does not preclude SARS-CoV-2 infection  and should not be used as the  sole basis for treatment or other  patient management decisions.  A negative result may occur with  improper specimen collection / handling, submission of specimen other  than nasopharyngeal swab, presence of viral mutation(s) within the  areas targeted by this assay, and inadequate number of viral copies  (<250 copies / mL). A negative result must be combined with clinical  observations, patient history, and epidemiological information. If result is POSITIVE SARS-CoV-2 target nucleic acids are DETECTED. The SARS-CoV-2 RNA is generally detectable in upper and lower  respiratory specimens dur ing the acute phase of infection.  Positive   results are indicative of active infection with SARS-CoV-2.  Clinical  correlation with patient history and other diagnostic information is  necessary to determine patient infection status.  Positive results do  not rule out bacterial infection or co-infection with other viruses. If result is PRESUMPTIVE POSTIVE SARS-CoV-2 nucleic acids MAY BE PRESENT.   A presumptive positive result was obtained on the submitted specimen  and confirmed on repeat testing.  While 2019 novel coronavirus  (SARS-CoV-2) nucleic acids may be present in the submitted sample  additional confirmatory testing may be necessary for epidemiological  and / or clinical management purposes  to differentiate between  SARS-CoV-2 and other Sarbecovirus currently known to infect humans.  If clinically indicated additional testing with an alternate test  methodology [phone]([id number]) is advised. The SARS-CoV-2 RNA is generally  detectable in upper and lower respiratory sp ecimens during the acute  phase of infection. The expected result is Negative. Fact Sheet for Patients:  BoilerBrush.com.cyhttps://www.fda.gov/media/136312/download Fact Sheet for Healthcare Providers: https://pope.com/https://www.fda.gov/media/136313/download This test is not yet approved or cleared by the [place] FDA and has been authorized for detection and/or diagnosis of SARS-CoV-2 by FDA under an Emergency Use Authorization (EUA).  This EUA will remain in effect (meaning this test can be used) for the duration of the COVID-19 declaration under Section 564(b)(1) of the Act, 21 U.S.C. section 360bbb-3(b)(1), unless the authorization is terminated or revoked sooner. Performed at [facility], [address] [place], [place] [address]      Scheduled Meds: . atorvastatin  20 mg Oral Daily  . budesonide (PULMICORT) nebulizer solution  0.5 mg Nebulization BID  . furosemide  80 mg Intravenous BID  . heparin  5,000 Units Subcutaneous Q8H  . insulin aspart  0-9 Units Subcutaneous TID WC   . ipratropium-albuterol  3 mL Nebulization Q6H  . lidocaine  2 patch Transdermal Q24H  . methylPREDNISolone (SOLU-MEDROL) injection  60 mg Intravenous Q6H  . metoprolol tartrate  25 mg Oral BID  . pantoprazole  40 mg Oral Daily  . sertraline  50 mg Oral Daily  . sodium chloride flush  3 mL Intravenous Q12H   Continuous Infusions: . sodium chloride      Procedures/Studies: Dg Chest Portable 1 View  Result Date: 02/02/2019 CLINICAL DATA:  Shortness of breath. EXAM: PORTABLE CHEST 1 VIEW COMPARISON:  01/31/2019. FINDINGS: Prior CABG changes. Stable cardiomediastinal contours. Calcific aortic knob. Mild pulmonary vascular congestion. Small bilateral pleural effusions, left greater than right with associated bibasilar opacities. Findings similar to prior. No pneumothorax. IMPRESSION: 1. Small bilateral effusions, left greater than right, with associated bibasilar opacities, likely atelectasis. 2. Findings suggesting fluid overload/mild CHF. Findings are similar to prior. Electronically Signed   By: Duanne GuessNicholas  Plundo M.D.   On: 02/02/2019 09:59    Catarina Hartshornavid Kairi Harshbarger, DO  [facility] Pager [phone]  If 7PM-7AM, please contact night-coverage [email or web address] Password [email or web address] 02/04/2019, 12:18 PM   LOS:  2 days

## 2019-02-05 LAB — BASIC METABOLIC PANEL
Anion gap: 15 (ref 5–15)
BUN: 63 mg/dL — ABNORMAL HIGH (ref 8–23)
CO2: 40 mmol/L — ABNORMAL HIGH (ref 22–32)
Calcium: 9.9 mg/dL (ref 8.9–10.3)
Chloride: 84 mmol/L — ABNORMAL LOW (ref 98–111)
Creatinine, Ser: 1.3 mg/dL — ABNORMAL HIGH (ref 0.61–1.24)
GFR calc Af Amer: >60 mL/min (ref >60)
GFR calc non Af Amer: 53 mL/min — ABNORMAL LOW (ref >60)
Glucose, Bld: 157 mg/dL — ABNORMAL HIGH (ref 70–99)
Potassium: 4.6 mmol/L (ref 3.5–5.1)
Sodium: 139 mmol/L (ref 135–145)

## 2019-02-05 LAB — GLUCOSE, CAPILLARY
Glucose-Capillary: 142 mg/dL — ABNORMAL HIGH (ref 70–99)
Glucose-Capillary: 217 mg/dL — ABNORMAL HIGH (ref 70–99)

## 2019-02-05 MED ORDER — PREDNISONE 20 MG PO TABS: 60.0000 mg | ORAL_TABLET | Freq: Every day | ORAL | Status: DC

## 2019-02-05 MED ORDER — SPIRONOLACTONE 25 MG PO TABS
12.5000 mg | ORAL_TABLET | Freq: Every day | ORAL | Status: DC
  Filled 2019-02-05: qty 0.5

## 2019-02-05 MED ORDER — METOPROLOL TARTRATE 25 MG PO TABS: 25.0000 mg | ORAL_TABLET | Freq: Two times a day (BID) | ORAL | 2 refills | Status: AC

## 2019-02-05 MED ORDER — SPIRONOLACTONE 25 MG PO TABS: 12.5000 mg | ORAL_TABLET | Freq: Every day | ORAL | 1 refills | Status: AC

## 2019-02-05 MED ORDER — FERROUS SULFATE 325 (65 FE) MG PO TABS: 325.0000 mg | ORAL_TABLET | Freq: Every day | ORAL | Status: DC

## 2019-02-05 MED ORDER — PREDNISONE 10 MG PO TABS: 60.0000 mg | ORAL_TABLET | Freq: Every day | ORAL | 0 refills | Status: AC

## 2019-02-05 MED ORDER — FERROUS SULFATE 325 (65 FE) MG PO TABS: 325.0000 mg | ORAL_TABLET | Freq: Every day | ORAL | 3 refills | Status: AC

## 2019-02-05 NOTE — Discharge Summary (Signed)
Physician Discharge Summary  Miguel Mendoza GGY:[id number] DOB: 06-05-1940 DOA: 02/02/2019  PCP: Monico Blitz, MD  Admit date: 02/02/2019 Discharge date: 02/05/2019  Admitted From: Home Disposition:  Home   Recommendations for Outpatient Follow-up:  1. Follow up with PCP in 1-2 weeks 2. 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 The patient states that he visited his primary care provider on 01/27/2019 during which time his diuretic dose was increased. He continued to have shortness of breath. He visited emergency department at [facility] 01/30/2019, and his diuretic was changed to what he believed to be furosemide 100 mg daily. However, upon further questioning it appears that the patient has poor health literacy and really does not know the actual dosing or name of his diuretic. His past medical records have shown that the patient was previously taking torsemide 60 mg twice daily. In actuality, the patient is unclear regarding the actual diuretic pill and dosing upon further questioning. Nevertheless, he states that he has been compliant with the medication, but states that he has had indiscretion with his fluid intake. He states that he has been drinking more than 10 cups of water daily. He complains of increasing lower extremity edema and orthopnea type symptoms. He denies any fevers, chills, chest pain, nausea, vomiting, diarrhea, domino pain. He has a cough with white sputum. He denies any hemoptysis. He complains of increasing abdominal girth. In the  emergency department, the patient was afebrile hemodynamically stable saturating 85% on 2 L. His oxygen was increased to 5 L with saturations 92-94%. The patient was given furosemide 80 mg IV x1.  Discharge Diagnoses:  Acute on chronic respiratory failure with hypoxia -Secondary to COPD exacerbation and CHF exacerbation -Patient is currently on4L nasal cannula>>3L -At baseline, patient is on 2 L -d/c home with 3L [place] -Wean oxygen for saturation greater 92% -personally reviewed CXR--increase interstitial markings  Acute on chronic systolic and diastolic CHF -03/50/0938 echo EF 40%, grade 1 DD, diffuse HK, HK of the mid anterior and basal mid anteroseptal. Mild to moderate left ear, mild TR -Continue furosemide 80 mg IV twice daily>>>d/c home with prior dose of torsemide 60 mg bid -Daily weights--NEG 16 lbs -discharge weight 162 lbs -Accurate I's and O's--incomplete -10/9/20echo-EF 40-45%, impaired relax, mild TR, mild-mod AS -Continuemetoprolol 25 mg bid -DiscontinueAldactone due to hyperkalemia-->restart at lower dose 12.5 mg daily-->repeat BMP in one week after d/c  COPD exacerbation -ContinuePulmicort -ContinueIV Solu-Medrol -Continueduo nebs  Hyperkalemia -d/c spironolactone and losartan -repeat BMP  Essential hypertension -Continue decreaseddose of metoprolol secondary to soft blood pressures--SBP 110-120s on day of d/c -Discontinuelosartan secondary to CKD and hyperkalemia  CKD stage III -Baseline creatinine 1.4-1.6 -Patient presented with serum creatinine 1.74 -serum creatinine 1.30 on day of d/c  Chronic back pain -Restart Percocet at lower dose -Patient states that he takes Percocet 10 mg/325q 6 hours at home  Depression/anxiety -Continue home dose of sertraline  Diabetes mellitus type 2 -Holding metformin>>>restart after d/c with close renal monitoring -NovoLog sliding scale -Check hemoglobin A1c--6.1  Anemia of chronic disease -Serum  H82--9937 -Folic JIRC--78.9 -Ironsaturation 7%, ferritin 17 -feraheme x 1 given 10/9 -d/c home with po ferrous  sulfate  Hyperlipidemia -restart statin  Chronic back pain -continue home dose percocet -lidoderm patch during hospitalization -restart reduced dose tinzidine     Discharge Instructions   Allergies as of 02/05/2019      Reactions   Ibuprofen Hives      Medication List    STOP taking these medications   baclofen 10 MG tablet Commonly known as: LIORESAL   losartan 25 MG tablet Commonly known as: COZAAR   nitroGLYCERIN 0.4 MG SL tablet Commonly known as: NITROSTAT     TAKE these medications   albuterol 108 (90 Base) MCG/ACT inhaler Commonly known as: VENTOLIN HFA Inhale 2 puffs into the lungs every 6 (six) hours as needed for wheezing or shortness of breath.   atorvastatin 20 MG tablet Commonly known as: LIPITOR Take 20 mg by mouth daily.   metFORMIN 500 MG tablet Commonly known as: GLUCOPHAGE Take by mouth. Take 2 tabs by mouth every morning & 1 tab every evening   metoprolol tartrate 25 MG tablet Commonly known as: LOPRESSOR Take 1 tablet (25 mg total) by mouth 2 (two) times daily. What changed:   medication strength  how much to take  additional instructions   omeprazole 20 MG capsule Commonly known as: PRILOSEC Take 20 mg by mouth daily.   oxyCODONE-acetaminophen 7.5-325 MG tablet Commonly known as: PERCOCET Take 1 tablet by mouth every 4 (four) hours as needed for moderate pain. Max APAP 3gm/24 hrs from all sources   predniSONE 10 MG tablet Commonly known as: DELTASONE Take 6 tablets (60 mg total) by mouth daily with breakfast. And decrease by one tablet daily Start taking on: February 06, 2019   sertraline 50 MG tablet Commonly known as: ZOLOFT Take 50 mg by mouth daily.   spironolactone 25 MG tablet Commonly known as: ALDACTONE Take 0.5 tablets (12.5 mg total) by mouth daily. Start taking on: February 06, 2019 What  changed: how much to take   tiZANidine 4 MG capsule Commonly known as: ZANAFLEX Take 4 mg by mouth every 8 (eight) hours.   torsemide 20 MG tablet Commonly known as: DEMADEX Take 60 mg by mouth 2 (two) times daily.   traZODone 100 MG tablet Commonly known as: DESYREL Take 100 mg by mouth at bedtime.   VITAMIN B-12 IJ Inject as directed once a week.       Allergies  Allergen Reactions  . Ibuprofen Hives    Consultations:  none   Procedures/Studies: Dg Chest Portable 1 View  Result Date: 02/02/2019 CLINICAL DATA:  Shortness of breath. EXAM: PORTABLE CHEST 1 VIEW COMPARISON:  01/31/2019. FINDINGS: Prior CABG changes. Stable cardiomediastinal contours. Calcific aortic knob. Mild pulmonary vascular congestion. Small bilateral pleural effusions, left greater than right with associated bibasilar opacities. Findings similar to prior. No pneumothorax. IMPRESSION: 1. Small bilateral effusions, left greater than right, with associated bibasilar opacities, likely atelectasis. 2. Findings suggesting fluid overload/mild CHF. Findings are similar to prior. Electronically Signed   By: Duanne Guess M.D.   On: 02/02/2019 09:59         Discharge Exam: Vitals:   02/05/19 1301 02/05/19 1302  BP:    Pulse: 72   Resp:    Temp:    SpO2: 96% 96%   Vitals:   02/05/19 0815 02/05/19 0911 02/05/19 1301 02/05/19 1302  BP:  121/67    Pulse:  67 72   Resp:  16    Temp:      TempSrc:      SpO2: 99%  96% 96%  Weight:      Height:        General: Pt is alert, awake, not in acute distress Cardiovascular: RRR, S1/S2 +, no rubs, no gallops Respiratory: fine bibasilar crackles, no wheeze Abdominal: Soft, NT, ND, bowel sounds + Extremities: trace LE edema, no cyanosis   The results of significant diagnostics from this hospitalization (including imaging, microbiology, ancillary and laboratory) are listed below for reference.    Significant Diagnostic Studies: Dg Chest Portable 1  View  Result Date: 02/02/2019 CLINICAL DATA:  Shortness of breath. EXAM: PORTABLE CHEST 1 VIEW COMPARISON:  01/31/2019. FINDINGS: Prior CABG changes. Stable cardiomediastinal contours. Calcific aortic knob. Mild pulmonary vascular congestion. Small bilateral pleural effusions, left greater than right with associated bibasilar opacities. Findings similar to prior. No pneumothorax. IMPRESSION: 1. Small bilateral effusions, left greater than right, with associated bibasilar opacities, likely atelectasis. 2. Findings suggesting fluid overload/mild CHF. Findings are similar to prior. Electronically Signed   By: Duanne Guess M.D.   On: 02/02/2019 09:59     Microbiology: Recent Results (from the past 240 hour(s))  SARS Coronavirus 2 [facility] order, Performed in [facility] hospital lab) Nasopharyngeal Nasopharyngeal Swab     Status: None   Collection Time: 02/02/19  9:16 AM   Specimen: Nasopharyngeal Swab  Result Value Ref Range Status   SARS Coronavirus 2 NEGATIVE NEGATIVE Final    Comment: (NOTE) If result is NEGATIVE SARS-CoV-2 target nucleic acids are NOT DETECTED. The SARS-CoV-2 RNA is generally detectable in upper and lower  respiratory specimens during the acute phase of infection. The lowest  concentration of SARS-CoV-2 viral copies this assay can detect is 250  copies / mL. A negative result does not preclude SARS-CoV-2 infection  and should not be used as the sole basis for treatment or other  patient management decisions.  A negative result may occur with  improper specimen collection / handling, submission of specimen other  than nasopharyngeal swab, presence of viral mutation(s) within the  areas targeted by this assay, and inadequate number of viral copies  (<250 copies / mL). A negative result must be combined with clinical  observations, patient history, and epidemiological information. If result is POSITIVE SARS-CoV-2 target nucleic acids are DETECTED. The SARS-CoV-2 RNA  is generally detectable in upper and lower  respiratory specimens dur ing the acute phase of infection.  Positive  results are indicative of active infection with SARS-CoV-2.  Clinical  correlation with patient history and other diagnostic information is  necessary to determine patient infection status.  Positive results do  not rule out bacterial infection or co-infection with other viruses. If result is PRESUMPTIVE POSTIVE SARS-CoV-2 nucleic acids MAY BE PRESENT.   A presumptive positive result was obtained on the submitted specimen  and confirmed on repeat testing.  While 2019 novel coronavirus  (SARS-CoV-2) nucleic acids may be present in the submitted sample  additional confirmatory testing may be necessary for epidemiological  and / or clinical management purposes  to differentiate between  SARS-CoV-2 and other Sarbecovirus currently known to infect humans.  If clinically indicated additional testing with an alternate test  methodology [phone]) is advised. The SARS-CoV-2 RNA is generally  detectable in upper and lower respiratory sp ecimens during the acute  phase of infection. The expected result is Negative. Fact Sheet for Patients:  BoilerBrush.com.cy Fact Sheet for Healthcare Providers: https://pope.com/ This test is not yet approved or cleared by the [place] FDA and has been authorized for detection and/or diagnosis of  SARS-CoV-2 by FDA under an Emergency Use Authorization (EUA).  This EUA will remain in effect (meaning this test can be used) for the duration of the COVID-19 declaration under Section 564(b)(1) of the Act, 21 U.S.C. section 360bbb-3(b)(1), unless the authorization is terminated or revoked sooner. Performed at [facility], [address] [place], [place] [address]      Labs: Basic Metabolic Panel: Recent Labs  Lab 02/02/19 0915 02/03/19 0421 02/03/19 1256 02/04/19 0601 02/05/19 0647  NA 141  141 138 139 139  K 5.0 5.5* 4.7 4.8 4.6  CL 97* 93* 90* 88* 84*  CO2 32 37* 33* 39* 40*  GLUCOSE 110* 157* 200* 163* 157*  BUN 44* 49* 49* 57* 63*  CREATININE 1.74* 1.52* 1.50* 1.32* 1.30*  CALCIUM 9.2 9.3 9.1 9.4 9.9   Liver Function Tests: Recent Labs  Lab 02/02/19 0915  AST 16  ALT 12  ALKPHOS 70  BILITOT 0.4  PROT 7.7  ALBUMIN 3.4*   No results for input(s): LIPASE, AMYLASE in the last 168 hours. No results for input(s): AMMONIA in the last 168 hours. CBC: Recent Labs  Lab 02/02/19 0915  WBC 6.9  NEUTROABS 5.1  HGB 10.3*  HCT 39.3  MCV 102.6*  PLT 238   Cardiac Enzymes: No results for input(s): CKTOTAL, CKMB, CKMBINDEX, TROPONINI in the last 168 hours. BNP: Invalid input(s): POCBNP CBG: Recent Labs  Lab 02/04/19 1140 02/04/19 1617 02/04/19 2124 02/05/19 0743 02/05/19 1104  GLUCAP 207* 132* 200* 142* 217*    Time coordinating discharge:  36 minutes  Signed:  Catarina Hartshornavid Veniamin Kincaid, DO [facility] Pager: [phone] 02/05/2019, 1:41 PM

## 2019-02-05 NOTE — Progress Notes (Signed)
Patient discharged with paperwork and instructions. Patient denied further needs or questions.  Transferred to family. Elodia Florence RN 02/05/19 1500

## 2019-03-07 ENCOUNTER — Telehealth: Payer: Self-pay | Admitting: Cardiology

## 2019-03-07 NOTE — Telephone Encounter (Signed)
Pt voiced understanding - cx echo appt and has f/u 12/8

## 2019-03-07 NOTE — Telephone Encounter (Signed)
That recent echocardiogram is fine, he does not need a follow-up study.  His LVEF was somewhat better than prior assessment, 40 to 45%.  He probably has a follow-up visit scheduled since he was recently hospitalized.

## 2019-03-07 NOTE — Telephone Encounter (Signed)
Called to ask if the Echo he did in the hospital could be accepted for the one he is scheduled to do tomorrow

## 2019-03-07 NOTE — Telephone Encounter (Signed)
Echo done 10/9 at [facility] - ok to cancel echo scheduled for tomorrow?

## 2019-03-08 ENCOUNTER — Other Ambulatory Visit: Payer: Medicare HMO

## 2019-03-21 ENCOUNTER — Inpatient Hospital Stay (HOSPITAL_COMMUNITY): Admission: AD | Admit: 2019-03-21 | Payer: Medicare HMO | Admitting: Pulmonary Disease

## 2019-03-30 MED ORDER — BL TUSSIN CF 30-10-100 MG/5ML PO SYRP
1.00 | ORAL_SOLUTION | ORAL | Status: DC
Start: ? — End: 2019-03-30

## 2019-03-30 MED ORDER — HALENOL 160 MG/5ML OR LIQD
0.20 | ORAL | Status: DC
Start: ? — End: 2019-03-30

## 2019-03-30 MED ORDER — DURASORB UNDERPAD MISC
2.00 | Status: DC
Start: ? — End: 2019-03-30

## 2019-03-30 MED ORDER — FP GLUCOSE PO
1.00 | ORAL | Status: DC
Start: ? — End: 2019-03-30

## 2019-03-30 MED ORDER — DURASORB UNDERPAD MISC
6.00 | Status: DC
Start: 2019-03-28 — End: 2019-03-30

## 2019-04-04 ENCOUNTER — Telehealth: Payer: Medicare HMO | Admitting: Cardiology

## 2019-04-04 ENCOUNTER — Ambulatory Visit: Payer: Medicare HMO | Admitting: Cardiology

## 2019-04-28 DEATH — deceased
